# Patient Record
Sex: Female | Born: 1990 | Hispanic: Yes | Smoking: Former smoker
Health system: Southern US, Community
[De-identification: ages and names within clinical notes are randomized; demographics above are authoritative.]

## PROBLEM LIST (undated history)

## (undated) ENCOUNTER — Emergency Department (HOSPITAL_COMMUNITY): Payer: Medicaid Other

## (undated) DIAGNOSIS — H6690 Otitis media, unspecified, unspecified ear: Secondary | ICD-10-CM

## (undated) DIAGNOSIS — F329 Major depressive disorder, single episode, unspecified: Secondary | ICD-10-CM

## (undated) DIAGNOSIS — A749 Chlamydial infection, unspecified: Secondary | ICD-10-CM

## (undated) DIAGNOSIS — F32A Depression, unspecified: Secondary | ICD-10-CM

## (undated) DIAGNOSIS — D3500 Benign neoplasm of unspecified adrenal gland: Secondary | ICD-10-CM

## (undated) DIAGNOSIS — F419 Anxiety disorder, unspecified: Secondary | ICD-10-CM

## (undated) HISTORY — DX: Chlamydial infection, unspecified: A74.9

## (undated) HISTORY — PX: WISDOM TOOTH EXTRACTION: SHX21

---
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
## 2014-08-18 DIAGNOSIS — F32A Depression, unspecified: Secondary | ICD-10-CM | POA: Insufficient documentation

## 2014-08-18 DIAGNOSIS — Z8659 Personal history of other mental and behavioral disorders: Secondary | ICD-10-CM | POA: Insufficient documentation

## 2016-02-08 DIAGNOSIS — J029 Acute pharyngitis, unspecified: Secondary | ICD-10-CM | POA: Diagnosis present

## 2016-02-08 DIAGNOSIS — J039 Acute tonsillitis, unspecified: Secondary | ICD-10-CM | POA: Insufficient documentation

## 2016-02-09 ENCOUNTER — Emergency Department (HOSPITAL_BASED_OUTPATIENT_CLINIC_OR_DEPARTMENT_OTHER): Payer: Medicaid Other

## 2016-02-09 ENCOUNTER — Emergency Department (HOSPITAL_BASED_OUTPATIENT_CLINIC_OR_DEPARTMENT_OTHER)
Admission: EM | Admit: 2016-02-09 | Discharge: 2016-02-09 | Disposition: A | Discharge status: Home / Self Care | Payer: Medicaid Other | Attending: Emergency Medicine | Admitting: Emergency Medicine

## 2016-02-09 ENCOUNTER — Encounter (HOSPITAL_BASED_OUTPATIENT_CLINIC_OR_DEPARTMENT_OTHER): Payer: Self-pay | Admitting: Emergency Medicine

## 2016-02-09 DIAGNOSIS — J039 Acute tonsillitis, unspecified: Secondary | ICD-10-CM

## 2016-02-09 LAB — CBC WITH DIFFERENTIAL/PLATELET
BASOS ABS: 0 10*3/uL (ref 0.0–0.1)
BASOS PCT: 0 %
EOS ABS: 0.4 10*3/uL (ref 0.0–0.7)
Eosinophils Relative: 3 %
HCT: 36.5 % (ref 36.0–46.0)
HEMOGLOBIN: 11.8 g/dL — AB (ref 12.0–15.0)
LYMPHS ABS: 3.4 10*3/uL (ref 0.7–4.0)
Lymphocytes Relative: 30 %
MCH: 29.5 pg (ref 26.0–34.0)
MCHC: 32.3 g/dL (ref 30.0–36.0)
MCV: 91.3 fL (ref 78.0–100.0)
Monocytes Absolute: 0.8 10*3/uL (ref 0.1–1.0)
Monocytes Relative: 7 %
NEUTROS PCT: 60 %
Neutro Abs: 6.7 10*3/uL (ref 1.7–7.7)
Platelets: 365 10*3/uL (ref 150–400)
RBC: 4 MIL/uL (ref 3.87–5.11)
RDW: 12.6 % (ref 11.5–15.5)
WBC: 11.3 10*3/uL — AB (ref 4.0–10.5)

## 2016-02-09 LAB — BASIC METABOLIC PANEL
Anion gap: 7 (ref 5–15)
BUN: 15 mg/dL (ref 6–20)
CALCIUM: 8.7 mg/dL — AB (ref 8.9–10.3)
CHLORIDE: 107 mmol/L (ref 101–111)
CO2: 25 mmol/L (ref 22–32)
CREATININE: 0.81 mg/dL (ref 0.44–1.00)
Glucose, Bld: 99 mg/dL (ref 65–99)
Potassium: 3.7 mmol/L (ref 3.5–5.1)
SODIUM: 139 mmol/L (ref 135–145)

## 2016-02-09 LAB — PREGNANCY, URINE: PREG TEST UR: NEGATIVE

## 2016-02-09 LAB — RAPID STREP SCREEN (MED CTR MEBANE ONLY): Streptococcus, Group A Screen (Direct): NEGATIVE

## 2016-02-09 LAB — MONONUCLEOSIS SCREEN: MONO SCREEN: NEGATIVE

## 2016-02-09 MED ORDER — SODIUM CHLORIDE 0.9 % IV BOLUS (SEPSIS)
1000.0000 mL | Freq: Once | INTRAVENOUS | Status: AC
  Administered 2016-02-09: 1000 mL via INTRAVENOUS

## 2016-02-09 MED ORDER — IOPAMIDOL (ISOVUE-300) INJECTION 61%
75.0000 mL | Freq: Once | INTRAVENOUS | Status: AC | PRN
Start: 2016-02-09 — End: 2016-02-09
  Administered 2016-02-09: 75 mL via INTRAVENOUS

## 2016-02-09 MED ORDER — IOPAMIDOL (ISOVUE-300) INJECTION 61%: 80.0000 mL | Freq: Once | INTRAVENOUS | Status: DC | PRN

## 2016-02-09 MED ORDER — DEXAMETHASONE SODIUM PHOSPHATE 10 MG/ML IJ SOLN
10.0000 mg | Freq: Once | INTRAMUSCULAR | Status: AC
  Administered 2016-02-09: 10 mg via INTRAVENOUS
  Filled 2016-02-09: qty 1

## 2016-02-09 MED ORDER — PREDNISONE 10 MG PO TABS: 20.0000 mg | ORAL_TABLET | Freq: Two times a day (BID) | ORAL | Status: DC

## 2016-02-09 MED ORDER — CEPHALEXIN 500 MG PO CAPS: 500.0000 mg | ORAL_CAPSULE | Freq: Four times a day (QID) | ORAL | Status: DC

## 2016-02-09 NOTE — ED Notes (Signed)
Up to b/r, steady gait 

## 2016-02-09 NOTE — ED Notes (Signed)
Patient reports that she has a sore throat that she has had for 2 weeks. She reports that it has worsened to the point that now she can not talk or eat

## 2016-02-09 NOTE — ED Notes (Signed)
Pt transported to and from CT.

## 2016-02-09 NOTE — Discharge Instructions (Signed)
Keflex as prescribed.  Prednisone as prescribed.  Return to the emergency department if you develop difficulty breathing, an inability to swallow, or other new and concerning symptoms.   Tonsillitis Tonsillitis is an infection of the throat that causes the tonsils to become red, tender, and swollen. Tonsils are collections of lymphoid tissue at the back of the throat. Each tonsil has crevices (crypts). Tonsils help fight nose and throat infections and keep infection from spreading to other parts of the body for the first 18 months of life.  CAUSES Sudden (acute) tonsillitis is usually caused by infection with streptococcal bacteria. Long-lasting (chronic) tonsillitis occurs when the crypts of the tonsils become filled with pieces of food and bacteria, which makes it easy for the tonsils to become repeatedly infected. SYMPTOMS  Symptoms of tonsillitis include:  A sore throat, with possible difficulty swallowing.  White patches on the tonsils.  Fever.  Tiredness.  New episodes of snoring during sleep, when you did not snore before.  Small, foul-smelling, yellowish-white pieces of material (tonsilloliths) that you occasionally cough up or spit out. The tonsilloliths can also cause you to have bad breath. DIAGNOSIS Tonsillitis can be diagnosed through a physical exam. Diagnosis can be confirmed with the results of lab tests, including a throat culture. TREATMENT  The goals of tonsillitis treatment include the reduction of the severity and duration of symptoms and prevention of associated conditions. Symptoms of tonsillitis can be improved with the use of steroids to reduce the swelling. Tonsillitis caused by bacteria can be treated with antibiotic medicines. Usually, treatment with antibiotic medicines is started before the cause of the tonsillitis is known. However, if it is determined that the cause is not bacterial, antibiotic medicines will not treat the tonsillitis. If attacks of  tonsillitis are severe and frequent, your health care provider may recommend surgery to remove the tonsils (tonsillectomy). HOME CARE INSTRUCTIONS   Rest as much as possible and get plenty of sleep.  Drink plenty of fluids. While the throat is very sore, eat soft foods or liquids, such as sherbet, soups, or instant breakfast drinks.  Eat frozen ice pops.  Gargle with a warm or cold liquid to help soothe the throat. Mix 1/4 teaspoon of salt and 1/4 teaspoon of baking soda in 8 oz of water. SEEK MEDICAL CARE IF:   Large, tender lumps develop in your neck.  A rash develops.  A green, yellow-brown, or bloody substance is coughed up.  You are unable to swallow liquids or food for 24 hours.  You notice that only one of the tonsils is swollen. SEEK IMMEDIATE MEDICAL CARE IF:   You develop any new symptoms such as vomiting, severe headache, stiff neck, chest pain, or trouble breathing or swallowing.  You have severe throat pain along with drooling or voice changes.  You have severe pain, unrelieved with recommended medications.  You are unable to fully open the mouth.  You develop redness, swelling, or severe pain anywhere in the neck.  You have a fever. MAKE SURE YOU:   Understand these instructions.  Will watch your condition.  Will get help right away if you are not doing well or get worse.   This information is not intended to replace advice given to you by your health care provider. Make sure you discuss any questions you have with your health care provider.   Document Released: 06/10/2005 Document Revised: 09/21/2014 Document Reviewed: 02/17/2013 Elsevier Interactive Patient Education [employer].

## 2016-02-09 NOTE — ED Notes (Signed)
MD at bedside explaining results to patient and family.Marland Kitchen

## 2016-02-09 NOTE — ED Notes (Addendum)
"  feel better", updated on wait, alert, NAD, calm, interactive. IVF infusing.

## 2016-02-09 NOTE — ED Provider Notes (Signed)
CSN: [id number]     Arrival date & time 02/08/16  2357 History  By signing my name below, I, Hansel Feinstein and Georgette Shell, attest that this documentation has been prepared under the direction and in the presence of Veryl Speak, MD. Electronically Signed: Hansel Feinstein and Georgette Shell, ED Scribe. 02/09/2016. 12:50 AM.   Chief Complaint  Patient presents with  . Sore Throat    Patient is a 25 y.o. female presenting with pharyngitis. The history is provided by the patient. No language interpreter was used.  Sore Throat This is a new problem. The current episode started more than 1 week ago. The problem occurs constantly. The problem has been gradually worsening. Pertinent negatives include no chest pain, no abdominal pain, no headaches and no shortness of breath. The symptoms are aggravated by swallowing and eating. Nothing relieves the symptoms. She has tried nothing for the symptoms. The treatment provided no relief.   HPI Comments: Diana Fuentes is a 25 y.o. female who presents to the Emergency Department complaining of gradually worsening, constant, burning sore throat (R>L) onset a week and a half ago. Patient states her pain is worsened when talking, swallowing and eating. Pt denies taking OTC medications at home to improve symptoms. Patient reports recent sick contact with boyfriend who had URI symptoms. Patient denies fever, cough, difficulty tolerating secretions.  History reviewed. No pertinent past medical history. History reviewed. No pertinent past surgical history. History reviewed. No pertinent family history. Social History  Substance Use Topics  . Smoking status: Never Smoker   . Smokeless tobacco: None  . Alcohol Use: No   OB History    No data available     Review of Systems  Constitutional: Negative for fever.  HENT: Positive for sore throat.   Respiratory: Negative for shortness of breath.   Cardiovascular: Negative for chest pain.  Gastrointestinal: Negative for  abdominal pain.  Neurological: Negative for headaches.  All other systems reviewed and are negative.   Allergies  Review of patient's allergies indicates no known allergies.  Home Medications   Prior to Admission medications   Not on File   BP 132/94 mmHg  Pulse 81  Temp(Src) 99 F (37.2 C) (Oral)  Resp 18  Ht 5\' 5"  (1.651 m)  Wt 220 lb (99.791 kg)  BMI 36.61 kg/m2  SpO2 100%  LMP 01/28/2016 Physical Exam  Constitutional: She is oriented to person, place, and time. She appears well-developed and well-nourished.  HENT:  Head: Normocephalic.  Mouth/Throat: No oropharyngeal exudate.  Right tonsil is hypertrophied deviated across the midline. No exudates or significant swelling otherwise. TMs are clear bilaterally.  Eyes: Conjunctivae are normal.  Neck: Normal range of motion. Neck supple.  Cardiovascular: Normal rate, regular rhythm and normal heart sounds.   Pulmonary/Chest: Effort normal and breath sounds normal. No respiratory distress.  Abdominal: She exhibits no distension.  Musculoskeletal: Normal range of motion.  Lymphadenopathy:    She has cervical adenopathy.  Neurological: She is alert and oriented to person, place, and time.  Skin: Skin is warm and dry.  Psychiatric: She has a normal mood and affect. Her behavior is normal.  Nursing note and vitals reviewed.   ED Course  Procedures (including critical care time) DIAGNOSTIC STUDIES: Oxygen Saturation is 100% on RA, normal by my interpretation.    COORDINATION OF CARE: 12:50 AM Discussed treatment plan with pt at bedside which includes rapid strep, CT and pt agreed to plan.   Labs Review Labs Reviewed  RAPID STREP  SCREEN (NOT AT [facility])  CULTURE, GROUP A STREP [facility])    Imaging Review No results found. I have personally reviewed and evaluated these images and lab results as part of my medical decision-making.   EKG Interpretation None      MDM   Final diagnoses:  None    Patient is a  25 year old female who presents with complaints of sore throat. Her initial strep test was negative. On exam, her right tonsil was significantly swollen out of proportion with the left. This raised the concern of a possible abscess. For this reason a CT scan of the soft tissues was obtained which revealed tonsillitis, however no abscess. She will be discharged with steroids, Keflex, and when necessary return. Her mono test was negative as well.  I personally performed the services described in this documentation, which was scribed in my presence. The recorded information has been reviewed and is accurate.         Veryl Speak, MD 02/09/16 [phone]

## 2016-02-12 LAB — CULTURE, GROUP A STREP (THRC)

## 2016-04-17 ENCOUNTER — Emergency Department (HOSPITAL_BASED_OUTPATIENT_CLINIC_OR_DEPARTMENT_OTHER)
Admission: EM | Admit: 2016-04-17 | Discharge: 2016-04-17 | Disposition: A | Discharge status: Home / Self Care | Payer: Medicaid Other | Attending: Emergency Medicine | Admitting: Emergency Medicine

## 2016-04-17 ENCOUNTER — Encounter (HOSPITAL_BASED_OUTPATIENT_CLINIC_OR_DEPARTMENT_OTHER): Payer: Self-pay | Admitting: Emergency Medicine

## 2016-04-17 DIAGNOSIS — F17200 Nicotine dependence, unspecified, uncomplicated: Secondary | ICD-10-CM | POA: Insufficient documentation

## 2016-04-17 DIAGNOSIS — H6092 Unspecified otitis externa, left ear: Secondary | ICD-10-CM | POA: Insufficient documentation

## 2016-04-17 HISTORY — DX: Otitis media, unspecified, unspecified ear: H66.90

## 2016-04-17 MED ORDER — CIPROFLOXACIN-DEXAMETHASONE 0.3-0.1 % OT SUSP
4.0000 [drp] | Freq: Once | OTIC | Status: AC
  Administered 2016-04-17: 4 [drp] via OTIC
  Filled 2016-04-17: qty 7.5

## 2016-04-17 NOTE — ED Triage Notes (Signed)
Patient reports left ear pain which began yesterday.  Reports pain radiates to left side of forehead.  Denies fevers, nasal congestion.  Reports history of ear infections.

## 2016-04-17 NOTE — ED Provider Notes (Signed)
[facility] Provider Note   CSN: [id number] Arrival date & time: 04/17/16  [id number]  First Provider Contact:  First MD Initiated Contact with Patient 04/17/16 [phone]        History   Chief Complaint Chief Complaint  Patient presents with  . Otalgia    HPI Diana Fuentes is a 25 y.o. female.  The history is provided by the patient.  Otalgia  This is a new problem. The current episode started 2 days ago. There is pain in the left ear. The problem occurs constantly. The problem has been gradually worsening. There has been no fever. The pain is at a severity of 8/10. The pain is severe. Pertinent negatives include no ear discharge, no headaches, no rhinorrhea, no sore throat and no cough. Associated symptoms comments: Patient has been swimming a lot recently at the pool. Her past medical history does not include chronic ear infection or hearing loss.    Past Medical History:  Diagnosis Date  . Ear infection     There are no active problems to display for this patient.   Past Surgical History:  Procedure Laterality Date  . WISDOM TOOTH EXTRACTION      OB History    No data available       Home Medications    Prior to Admission medications   Medication Sig Start Date End Date Taking? Authorizing Provider  cephALEXin (KEFLEX) 500 MG capsule Take 1 capsule (500 mg total) by mouth 4 (four) times daily. 02/09/16   Veryl Speak, MD  predniSONE (DELTASONE) 10 MG tablet Take 2 tablets (20 mg total) by mouth 2 (two) times daily. 02/09/16   Veryl Speak, MD    Family History History reviewed. No pertinent family history.  Social History Social History  Substance Use Topics  . Smoking status: Never Smoker  . Smokeless tobacco: Current User  . Alcohol use No     Allergies   Review of patient's allergies indicates no known allergies.   Review of Systems Review of Systems  HENT: Positive for ear pain. Negative for ear discharge, rhinorrhea and sore throat.     Respiratory: Negative for cough.   Neurological: Negative for headaches.  All other systems reviewed and are negative.    Physical Exam Updated Vital Signs BP 123/94 (BP Location: Right Arm)   Pulse 70   Temp 98.7 F (37.1 C) (Oral)   Resp 18   Ht 5\' 5"  (1.651 m)   Wt 215 lb (97.5 kg)   LMP 03/05/2016 (Approximate)   SpO2 100%   BMI 35.78 kg/m   Physical Exam  Constitutional: She is oriented to person, place, and time. She appears well-developed and well-nourished. No distress.  HENT:  Head: Normocephalic and atraumatic.  Right Ear: Tympanic membrane normal.  Left Ear: There is drainage and swelling.  Ears:  Mouth/Throat: Oropharynx is clear and moist.  Eyes: Conjunctivae and EOM are normal. Pupils are equal, round, and reactive to light.  Neck: Normal range of motion. Neck supple.  Cardiovascular: Normal rate, regular rhythm and intact distal pulses.   No murmur heard. Pulmonary/Chest: Effort normal and breath sounds normal. No respiratory distress. She has no wheezes. She has no rales.  Musculoskeletal: Normal range of motion. She exhibits no edema or tenderness.  Neurological: She is alert and oriented to person, place, and time.  Skin: Skin is warm and dry. No rash noted. No erythema.  Psychiatric: She has a normal mood and affect. Her behavior is normal.  Nursing  note and vitals reviewed.    ED Treatments / Results  Labs (all labs ordered are listed, but only abnormal results are displayed) Labs Reviewed - No data to display  EKG  EKG Interpretation None       Radiology No results found.  Procedures Procedures (including critical care time)  Medications Ordered in ED Medications  ciprofloxacin-dexamethasone (CIPRODEX) 0.3-0.1 % otic suspension 4 drop (not administered)     Initial Impression / Assessment and Plan / ED Course  I have reviewed the triage vital signs and the nursing notes.  Pertinent labs & imaging results that were available  during my care of the patient were reviewed by me and considered in my medical decision making (see chart for details).  Clinical Course    Patient presents with symptoms consistent with left otitis media. There are no complicating features. She was started on Ciprodex drops.  Final Clinical Impressions(s) / ED Diagnoses   Final diagnoses:  Otitis externa, left    New Prescriptions New Prescriptions   No medications on file     Blanchie Dessert, MD 04/17/16 [phone]

## 2016-04-17 NOTE — ED Notes (Signed)
MD at bedside. 

## 2016-04-19 ENCOUNTER — Encounter (HOSPITAL_BASED_OUTPATIENT_CLINIC_OR_DEPARTMENT_OTHER): Payer: Self-pay | Admitting: Emergency Medicine

## 2016-04-19 ENCOUNTER — Emergency Department (HOSPITAL_BASED_OUTPATIENT_CLINIC_OR_DEPARTMENT_OTHER)
Admission: EM | Admit: 2016-04-19 | Discharge: 2016-04-19 | Disposition: A | Discharge status: Home / Self Care | Payer: Medicaid Other | Attending: Emergency Medicine | Admitting: Emergency Medicine

## 2016-04-19 DIAGNOSIS — H608X2 Other otitis externa, left ear: Secondary | ICD-10-CM | POA: Insufficient documentation

## 2016-04-19 DIAGNOSIS — H61002 Unspecified perichondritis of left external ear: Secondary | ICD-10-CM

## 2016-04-19 DIAGNOSIS — H61012 Acute perichondritis of left external ear: Secondary | ICD-10-CM | POA: Insufficient documentation

## 2016-04-19 DIAGNOSIS — H6092 Unspecified otitis externa, left ear: Secondary | ICD-10-CM

## 2016-04-19 LAB — BASIC METABOLIC PANEL
Anion gap: 6 (ref 5–15)
BUN: 12 mg/dL (ref 6–20)
CHLORIDE: 107 mmol/L (ref 101–111)
CO2: 26 mmol/L (ref 22–32)
Calcium: 8.7 mg/dL — ABNORMAL LOW (ref 8.9–10.3)
Creatinine, Ser: 0.78 mg/dL (ref 0.44–1.00)
GFR calc non Af Amer: >60 mL/min (ref >60)
Glucose, Bld: 103 mg/dL — ABNORMAL HIGH (ref 65–99)
POTASSIUM: 3.8 mmol/L (ref 3.5–5.1)
SODIUM: 139 mmol/L (ref 135–145)

## 2016-04-19 MED ORDER — CIPROFLOXACIN HCL 500 MG PO TABS: 500.0000 mg | ORAL_TABLET | Freq: Two times a day (BID) | ORAL | 0 refills | Status: DC

## 2016-04-19 MED ORDER — DOXYCYCLINE HYCLATE 100 MG PO CAPS
100.0000 mg | ORAL_CAPSULE | Freq: Two times a day (BID) | ORAL | 0 refills | Status: DC
Start: 2016-04-19 — End: 2018-02-06

## 2016-04-19 MED ORDER — IBUPROFEN 800 MG PO TABS: 800.0000 mg | ORAL_TABLET | Freq: Three times a day (TID) | ORAL | 0 refills | Status: DC

## 2016-04-19 MED ORDER — CIPROFLOXACIN HCL 500 MG PO TABS
500.0000 mg | ORAL_TABLET | Freq: Once | ORAL | Status: AC
  Administered 2016-04-19: 500 mg via ORAL
  Filled 2016-04-19: qty 1

## 2016-04-19 NOTE — ED Notes (Signed)
MD with pt  

## 2016-04-19 NOTE — ED Provider Notes (Signed)
[facility] Provider Note   CSN: [id number] Arrival date & time: 04/19/16  [id number]  First Provider Contact:  First MD Initiated Contact with Patient 04/19/16 [phone]        History   Chief Complaint No chief complaint on file.   HPI Diana Fuentes is a 25 y.o. female.  The history is provided by the patient.  Otalgia  This is a new problem. The current episode started more than 2 days ago. The problem occurs constantly. The problem has not changed since onset.There has been no fever. The pain is moderate. Associated symptoms include ear discharge. Pertinent negatives include no sore throat. Her past medical history does not include chronic ear infection.  Started on ciprodex in the ED on 04/17/16.  No f/c/r. Is instilling ear drops 3 times daily now with redness and warmth of the pinna.    Past Medical History:  Diagnosis Date  . Ear infection     There are no active problems to display for this patient.   Past Surgical History:  Procedure Laterality Date  . WISDOM TOOTH EXTRACTION      OB History    No data available       Home Medications    Prior to Admission medications   Medication Sig Start Date End Date Taking? Authorizing Provider  cephALEXin (KEFLEX) 500 MG capsule Take 1 capsule (500 mg total) by mouth 4 (four) times daily. 02/09/16   Veryl Speak, MD  predniSONE (DELTASONE) 10 MG tablet Take 2 tablets (20 mg total) by mouth 2 (two) times daily. 02/09/16   Veryl Speak, MD    Family History No family history on file.  Social History Social History  Substance Use Topics  . Smoking status: Never Smoker  . Smokeless tobacco: Current User  . Alcohol use No     Allergies   Review of patient's allergies indicates no known allergies.   Review of Systems Review of Systems  Constitutional: Negative for fever.  HENT: Positive for ear discharge and ear pain. Negative for facial swelling and sore throat.   All other systems reviewed and are  negative.    Physical Exam Updated Vital Signs BP 121/81 (BP Location: Left Arm)   Pulse 104   Temp 98.7 F (37.1 C) (Oral)   Resp 20   Ht 5\' 5"  (1.651 m)   Wt 210 lb (95.3 kg)   LMP 03/05/2016 (Approximate)   SpO2 100%   BMI 34.95 kg/m   Physical Exam  Constitutional: She is oriented to person, place, and time. She appears well-developed and well-nourished. No distress.  HENT:  Head: Normocephalic and atraumatic.  Right Ear: External ear normal. No mastoid tenderness. Tympanic membrane is not injected.  Left Ear: No mastoid tenderness.  Ears:  Nose: Nose normal.  Mouth/Throat: Oropharynx is clear and moist.  Eyes: EOM are normal. Pupils are equal, round, and reactive to light.  Neck: Normal range of motion. Neck supple. No tracheal deviation present.  Cardiovascular: Normal rate, regular rhythm and intact distal pulses.   Pulmonary/Chest: Effort normal and breath sounds normal.  Abdominal: Soft. Bowel sounds are normal.  Musculoskeletal: Normal range of motion.  Lymphadenopathy:    She has no cervical adenopathy.  Neurological: She is alert and oriented to person, place, and time. She has normal reflexes.  Skin: Skin is warm and dry. Capillary refill takes less than 2 seconds.  Psychiatric: She has a normal mood and affect.     ED Treatments / Results  Labs (all labs ordered are listed, but only abnormal results are displayed) Labs Reviewed - No data to display  EKG  EKG Interpretation None       Radiology No results found.  Procedures Procedures (including critical care time)  Medications Ordered in ED Medications - No data to display   Initial Impression / Assessment and Plan / ED Course  I have reviewed the triage vital signs and the nursing notes.  Pertinent labs & imaging results that were available during my care of the patient were reviewed by me and considered in my medical decision making (see chart for details).  Clinical Course    Vitals:   04/19/16 0520  BP: 121/81  Pulse: 104  Resp: 20  Temp: 98.7 F (37.1 C)   Results for orders placed or performed during the hospital encounter of [id number]  Basic metabolic panel  Result Value Ref Range   Sodium 139 135 - 145 mmol/L   Potassium 3.8 3.5 - 5.1 mmol/L   Chloride 107 101 - 111 mmol/L   CO2 26 22 - 32 mmol/L   Glucose, Bld 103 (H) 65 - 99 mg/dL   BUN 12 6 - 20 mg/dL   Creatinine, Ser 0.78 0.44 - 1.00 mg/dL   Calcium 8.7 (L) 8.9 - 10.3 mg/dL   GFR calc non Af Amer >60 >60 mL/min   GFR calc Af Amer >60 >60 mL/min   Anion gap 6 5 - 15   No results found.  Medications  ciprofloxacin (CIPRO) tablet 500 mg (500 mg Oral Given 04/19/16 0604)    Given swelling of the pinna, will start oral cipro and have patient follow up with ENT.  No diabetes.  If any problems go to [facility] as can be seen by ENT there.  Told patient it is imperative that she take all antibiotics and call Monday am to be seen immediately by ENT and that she must use drops four times daily as previously directed  Final Clinical Impressions(s) / ED Diagnoses   Final diagnoses:  None    New Prescriptions New Prescriptions   No medications on file  All questions answered to patient's satisfaction. Based on history and exam patient has been appropriately medically screened and emergency conditions excluded. Patient is stable for discharge at this time. Follow up with your PMDfor recheck in 2 daysand strict return precautions given.   Veatrice Kells, MD 04/19/16 [phone]

## 2016-04-19 NOTE — ED Triage Notes (Addendum)
C/o left ear pain for past couple days. Was seen and treated for same and states not getting better. Fever yesterday. States she had n/v yesterday, but states that has improved. No other complaints. Swelling noted to left external ear area on exam. States she can hear "a little" out of her left ear and feels like fluid is in her ear.

## 2016-04-20 ENCOUNTER — Emergency Department (HOSPITAL_COMMUNITY)
Admission: EM | Admit: 2016-04-20 | Discharge: 2016-04-20 | Disposition: A | Discharge status: Home / Self Care | Payer: Self-pay | Attending: Emergency Medicine | Admitting: Emergency Medicine

## 2016-04-20 ENCOUNTER — Emergency Department (HOSPITAL_COMMUNITY): Payer: Self-pay

## 2016-04-20 DIAGNOSIS — H6093 Unspecified otitis externa, bilateral: Secondary | ICD-10-CM | POA: Insufficient documentation

## 2016-04-20 LAB — CBC WITH DIFFERENTIAL/PLATELET
Basophils Absolute: 0 10*3/uL (ref 0.0–0.1)
Basophils Relative: 0 %
EOS ABS: 0.1 10*3/uL (ref 0.0–0.7)
EOS PCT: 1 %
HCT: 36.4 % (ref 36.0–46.0)
HEMOGLOBIN: 11.7 g/dL — AB (ref 12.0–15.0)
LYMPHS ABS: 1.6 10*3/uL (ref 0.7–4.0)
Lymphocytes Relative: 14 %
MCH: 29.3 pg (ref 26.0–34.0)
MCHC: 32.1 g/dL (ref 30.0–36.0)
MCV: 91 fL (ref 78.0–100.0)
MONOS PCT: 5 %
Monocytes Absolute: 0.6 10*3/uL (ref 0.1–1.0)
NEUTROS PCT: 80 %
Neutro Abs: 9.5 10*3/uL — ABNORMAL HIGH (ref 1.7–7.7)
Platelets: 350 10*3/uL (ref 150–400)
RBC: 4 MIL/uL (ref 3.87–5.11)
RDW: 12.6 % (ref 11.5–15.5)
WBC: 11.8 10*3/uL — ABNORMAL HIGH (ref 4.0–10.5)

## 2016-04-20 LAB — BASIC METABOLIC PANEL
Anion gap: 8 (ref 5–15)
BUN: 8 mg/dL (ref 6–20)
CHLORIDE: 107 mmol/L (ref 101–111)
CO2: 24 mmol/L (ref 22–32)
CREATININE: 0.58 mg/dL (ref 0.44–1.00)
Calcium: 8.8 mg/dL — ABNORMAL LOW (ref 8.9–10.3)
GFR calc Af Amer: >60 mL/min (ref >60)
GFR calc non Af Amer: >60 mL/min (ref >60)
GLUCOSE: 104 mg/dL — AB (ref 65–99)
Potassium: 3.9 mmol/L (ref 3.5–5.1)
SODIUM: 139 mmol/L (ref 135–145)

## 2016-04-20 LAB — I-STAT BETA HCG BLOOD, ED (MC, WL, AP ONLY)

## 2016-04-20 MED ORDER — DEXTROSE 5 % IV SOLN
2.0000 g | Freq: Three times a day (TID) | INTRAVENOUS | Status: DC
  Administered 2016-04-20: 2 g via INTRAVENOUS
  Filled 2016-04-20: qty 2

## 2016-04-20 MED ORDER — HYDROCODONE-ACETAMINOPHEN 5-325 MG PO TABS
1.0000 | ORAL_TABLET | Freq: Once | ORAL | Status: AC
  Administered 2016-04-20: 1 via ORAL
  Filled 2016-04-20: qty 1

## 2016-04-20 NOTE — ED Provider Notes (Signed)
[facility] DEPT Provider Note   CSN: [id number] Arrival date & time: 04/20/16  [id number]  First Provider Contact:  First MD Initiated Contact with Patient 04/20/16 [phone]        History   Chief Complaint Chief Complaint  Patient presents with  . Otalgia    HPI  Blood pressure 121/80, pulse 85, last menstrual period 04/03/2016, SpO2 99 %.  Diana Fuentes is a 25 y.o. female complaining of worsening bilateral otalgia with decreased hearing and purulent discharge. Patient has been seen 2 times for similar symptoms, initially she was started on Ciprodex, she was seen again after worsening symptoms and then started on Cipro and doxycycline. She states that she doesn't feel the Ciprodex is going into the ear because of the amount of drainage coming out of the ear.  HPI  Past Medical History:  Diagnosis Date  . Ear infection     There are no active problems to display for this patient.   Past Surgical History:  Procedure Laterality Date  . WISDOM TOOTH EXTRACTION      OB History    No data available       Home Medications    Prior to Admission medications   Medication Sig Start Date End Date Taking? Authorizing Provider  ciprofloxacin (CIPRO) 500 MG tablet Take 1 tablet (500 mg total) by mouth 2 (two) times daily. One po bid x 7 days 04/19/16  Yes April Palumbo, MD  Ciprofloxacin-Dexamethasone (CIPRODEX OT) Place 4 drops into both ears 4 (four) times daily.    Yes Historical Provider, MD  doxycycline (VIBRAMYCIN) 100 MG capsule Take 1 capsule (100 mg total) by mouth 2 (two) times daily. One po bid x 7 days 04/19/16  Yes April Palumbo, MD  ibuprofen (ADVIL,MOTRIN) 800 MG tablet Take 1 tablet (800 mg total) by mouth 3 (three) times daily. 04/19/16  Yes April Palumbo, MD    Family History No family history on file.  Social History Social History  Substance Use Topics  . Smoking status: Never Smoker  . Smokeless tobacco: Current User  . Alcohol use No     Allergies     Review of patient's allergies indicates no known allergies.   Review of Systems Review of Systems  10 systems reviewed and found to be negative, except as noted in the HPI.   Physical Exam Updated Vital Signs BP 113/62 (BP Location: Right Arm)   Pulse 80   Temp 98 F (36.7 C)   Resp 18   Ht 5\' 5"  (1.651 m)   Wt 95.3 kg   LMP 04/03/2016   SpO2 97%   BMI 34.95 kg/m   Physical Exam  Constitutional: She is oriented to person, place, and time. She appears well-developed and well-nourished. No distress.  HENT:  Head: Normocephalic and atraumatic.  Mouth/Throat: Oropharynx is clear and moist.  Bilateral ear canals are edematous with a significant amount of purulent drainage, bilateral ears also edematous and swollen, no focal bony tenderness over the mastoid bilaterally. Patient is exquisitely tender to light touch anywhere on the ear.  Eyes: Conjunctivae and EOM are normal. Pupils are equal, round, and reactive to light.  Neck: Normal range of motion.  Cardiovascular: Normal rate, regular rhythm and intact distal pulses.   Pulmonary/Chest: Effort normal and breath sounds normal.  Abdominal: Soft. There is no tenderness.  Musculoskeletal: Normal range of motion.  Neurological: She is alert and oriented to person, place, and time.  Skin: She is not diaphoretic.  Psychiatric: She has  a normal mood and affect.  Nursing note and vitals reviewed.     ED Treatments / Results  Labs (all labs ordered are listed, but only abnormal results are displayed) Labs Reviewed  CBC WITH DIFFERENTIAL/PLATELET - Abnormal; Notable for the following:       Result Value   WBC 11.8 (*)    Hemoglobin 11.7 (*)    Neutro Abs 9.5 (*)    All other components within normal limits  BASIC METABOLIC PANEL - Abnormal; Notable for the following:    Glucose, Bld 104 (*)    Calcium 8.8 (*)    All other components within normal limits  CULTURE, BLOOD (ROUTINE X 2)  CULTURE, BLOOD (ROUTINE X 2)  BODY  FLUID CULTURE  I-STAT BETA HCG BLOOD, ED ([facility], [facility], [facility] ONLY)    EKG  EKG Interpretation None       Radiology Ct Maxillofacial Wo Contrast  Addendum Date: 04/20/2016   ADDENDUM REPORT: 04/20/2016 10:57 ADDENDUM: Study discussed by telephone with Shakil Dirk PA-C on 04/20/2016 at 1040 hours. Electronically Signed   By: Genevie Ann M.D.   On: 04/20/2016 10:57   Result Date: 04/20/2016 CLINICAL DATA:  25 year old female with left ear ache for 1 week, decreased hearing. Not improving despite ear drops and antibiotics. EXAM: CT MAXILLOFACIAL WITHOUT CONTRAST TECHNIQUE: Multidetector CT imaging of the maxillofacial structures was performed. Multiplanar CT image reconstructions were also generated. A small metallic BB was placed on the right temple in order to reliably differentiate right from left. COMPARISON:  Neck CT 02/09/2016 FINDINGS: Negative visualized larynx. Stable to mildly decreased symmetric appearing enlargement of the palatine tonsils and adenoids. Negative parapharyngeal and retropharyngeal spaces. Negative sublingual space, and submandibular glands. Negative visualized noncontrast brain parenchyma. Visualized orbit soft tissues are within normal limits. Both noncontrast parotid glands appear normal, although bilateral parotid space lymph nodes have increased compared to May. These measure up to 8 mm short axis individually and appear reactive. There is associated bilateral external auditory canal soft tissue swelling, up to severe and worse on the left. There is no definite pannus soft tissue swelling. There is widespread subcutaneous fat stranding on the left side in both the pre-auricular and retroauricular soft tissues. There is associated mild thickening of the superficial capsule the left parotid gland. The inflammatory stranding tracks inferiorly on the left superficial to the platysma. The superficial aspect of the left sternocleidomastoid muscle is affected. There is additional left level  5 reactive lymphadenopathy. The left EAC is largely effaced. There is subtotal opacification of the left tympanic cavity. The ossicles appear intact. There is a fluid level in the left mastoid antrum. There is peripheral left mastoid opacification. No associated osseous erosion identified. In May the left tympanic cavity and mastoid were clear. The contralateral right tympanic cavity and mastoids remain clear. No acute osseous abnormality identified. Bilateral paranasal sinuses are clear. IMPRESSION: 1. Up to severe left greater than right external auditory canal soft tissue swelling compatible with external otitis. However, on the left there is subtotal opacification of the left tympanic cavity with scattered fluid within the left mastoid air cells, and an overlying left periauricular cellulitis. No associated osseous erosion identified. Reactive left greater than right peri-auricular lymphadenopathy. 2. The constellation favors infectious external otitis, progressing on the left to internal otitis and a facial cellulitis. The left mastoid changes appear to be reactive (mastoid effusion) and no other complicating features identified. However, if there is involvement of the left pinna clinically or if this patient  is diabetic or immunocompromised then consider a Pseudomonas infection. Electronically Signed: By: Genevie Ann M.D. On: 04/20/2016 10:38    Procedures Procedures (including critical care time)  Medications Ordered in ED Medications  ceFEPIme (MAXIPIME) 2 g in dextrose 5 % 50 mL IVPB (0 g Intravenous Stopped 04/20/16 1155)  HYDROcodone-acetaminophen (NORCO/VICODIN) 5-325 MG per tablet 1 tablet (1 tablet Oral Given 04/20/16 0732)     Initial Impression / Assessment and Plan / ED Course  I have reviewed the triage vital signs and the nursing notes.  Pertinent labs & imaging results that were available during my care of the patient were reviewed by me and considered in my medical decision making (see  chart for details).  Clinical Course     Vitals:   04/20/16 0930 04/20/16 0945 04/20/16 0950 04/20/16 1050  BP: 128/77 128/87  113/62  Pulse: 83 87  80  Resp:  16  18  Temp:    98 F (36.7 C)  SpO2: 97% 96%  97%  Weight:   95.3 kg   Height:   5\' 5"  (1.651 m)     Medications  ceFEPIme (MAXIPIME) 2 g in dextrose 5 % 50 mL IVPB (0 g Intravenous Stopped 04/20/16 1155)  HYDROcodone-acetaminophen (NORCO/VICODIN) 5-325 MG per tablet 1 tablet (1 tablet Oral Given 04/20/16 0732)    Josclyn Waag is 25 y.o. female presenting with Worsening otitis externa. She has been started on Ciprodex and then on doxycycline and Cipro orally yesterday and symptoms have significantly worsened, bilateral ears are edematous, she has a significant amount of purulent drainage from the outer ear canals. It doesn't appear that that was the case several days ago. Given the aggressive outpatient treatment and worsening symptoms will consult ENT.  Radiologist Dr. Nevada Crane Left canal swollen shut, middle ear opaciopfied, reactive mastoid fluid (not true mastoiditis)  Concern for possible malignant otitis externa.  Ear nose and throat consult from Dr. Erik Obey appreciated: He would like to see the patient in the office, will obtain basic bacterial culture of fluid from left ear. Patient states that she has a ride to the office and understands to go directly there.  Evaluation does not show pathology that would require ongoing emergent intervention or inpatient treatment. Pt is hemodynamically stable and mentating appropriately. Discussed findings and plan with patient/guardian, who agrees with care plan. All questions answered. Return precautions discussed and outpatient follow up given.      Final Clinical Impressions(s) / ED Diagnoses   Final diagnoses:  Otitis externa, bilateral    New Prescriptions New Prescriptions   No medications on file     Monico Blitz, PA-C 04/20/16 [place],  MD 04/20/16 1553

## 2016-04-20 NOTE — Discharge Instructions (Signed)
Go directly to Dr. Noreene Filbert office, he will meet you there at 1 PM.

## 2016-04-20 NOTE — ED Notes (Signed)
Placed patient back on continuous pulse oximetry and blood pressure cuff; patient sitting on stretcher eating a bag of potato chips and visitor at bedside laying across 2 chairs with a blanket over his head

## 2016-04-20 NOTE — ED Notes (Signed)
Diana Fuentes; Transporter, transporting pt to CT.

## 2016-04-20 NOTE — ED Triage Notes (Signed)
Pt arrives by POV with c/o earache. Has been to urgent care in [place] twice, given ear drops the first time and antibiotics the second time. States they told her to come here if swelling got worse. Pt states she woke up this morning and swelling had increased. Pt states barely able to hear.

## 2016-04-20 NOTE — Progress Notes (Signed)
Pharmacy Antibiotic Note Diana Fuentes is a 25 y.o. female admitted on 04/20/2016 with worsening bilateral otitis externa that failed to respond to outpatient abx course consisting of ciprofloxacin and doxycycline.  Pharmacy has been consulted for Cefepime  dosing.  Plan: 1. Cefepime 2 grams IV every 8 hours  Height: 5\' 5"  (165.1 cm) Weight: 210 lb (95.3 kg) IBW/kg (Calculated) : 57  Temp (24hrs), Avg:98 F (36.7 C), Min:98 F (36.7 C), Max:98 F (36.7 C)   Recent Labs Lab 04/19/16 0615 04/20/16 0937  WBC  --  11.8*  CREATININE 0.78 0.58    Estimated Creatinine Clearance: 125.9 mL/min (by C-G formula based on SCr of 0.8 mg/dL).    No Known Allergies  Antimicrobials this admission: 8/7 Cefepime >>   Dose adjustments this admission: n/a  Microbiology results: None to date  Thank you for allowing pharmacy to be a part of this patient's care.  Vincenza Hews, PharmD, BCPS 04/20/2016, 11:07 AM Pager: [phone]

## 2016-04-23 LAB — EAR CULTURE

## 2016-04-24 ENCOUNTER — Telehealth (HOSPITAL_BASED_OUTPATIENT_CLINIC_OR_DEPARTMENT_OTHER): Payer: Self-pay | Admitting: Emergency Medicine

## 2016-04-24 NOTE — Telephone Encounter (Signed)
Post ED Visit - Positive Culture Follow-up  Culture report reviewed by antimicrobial stewardship pharmacist:  []  Elenor Quinones, Pharm.D. []  Heide Guile, Pharm.D., BCPS []  Parks Neptune, Pharm.D. []  Alycia Rossetti, Pharm.D., BCPS []  [place], [place].D., BCPS, AAHIVP []  Legrand Como, Pharm.D., BCPS, AAHIVP []  Milus Glazier, Pharm.D. []  Stephens November, Pharm.D.  Positive ear culture Treated with cipro, organism sensitive to the same and no further patient follow-up is required at this time.  Patient confirms that she saw ENT Dr. Erik Obey as instructed to do  Hazle Nordmann 04/24/2016, 3:29 PM

## 2016-04-25 LAB — CULTURE, BLOOD (ROUTINE X 2)
Culture: NO GROWTH
Culture: NO GROWTH

## 2016-07-01 ENCOUNTER — Telehealth (HOSPITAL_BASED_OUTPATIENT_CLINIC_OR_DEPARTMENT_OTHER): Payer: Self-pay | Admitting: Emergency Medicine

## 2016-07-01 NOTE — Telephone Encounter (Signed)
Lost to followup 

## 2017-08-13 ENCOUNTER — Other Ambulatory Visit: Payer: Self-pay

## 2017-08-13 ENCOUNTER — Encounter (HOSPITAL_BASED_OUTPATIENT_CLINIC_OR_DEPARTMENT_OTHER): Payer: Self-pay | Admitting: *Deleted

## 2017-08-13 ENCOUNTER — Emergency Department (HOSPITAL_BASED_OUTPATIENT_CLINIC_OR_DEPARTMENT_OTHER)
Admission: EM | Admit: 2017-08-13 | Discharge: 2017-08-14 | Disposition: A | Discharge status: Home / Self Care | Payer: Medicaid Other | Attending: Emergency Medicine | Admitting: Emergency Medicine

## 2017-08-13 ENCOUNTER — Emergency Department (HOSPITAL_BASED_OUTPATIENT_CLINIC_OR_DEPARTMENT_OTHER): Payer: Medicaid Other

## 2017-08-13 DIAGNOSIS — Z791 Long term (current) use of non-steroidal anti-inflammatories (NSAID): Secondary | ICD-10-CM | POA: Insufficient documentation

## 2017-08-13 DIAGNOSIS — S3992XA Unspecified injury of lower back, initial encounter: Secondary | ICD-10-CM | POA: Diagnosis present

## 2017-08-13 DIAGNOSIS — M79602 Pain in left arm: Secondary | ICD-10-CM | POA: Insufficient documentation

## 2017-08-13 DIAGNOSIS — S39012A Strain of muscle, fascia and tendon of lower back, initial encounter: Secondary | ICD-10-CM | POA: Diagnosis not present

## 2017-08-13 DIAGNOSIS — Y929 Unspecified place or not applicable: Secondary | ICD-10-CM | POA: Diagnosis not present

## 2017-08-13 DIAGNOSIS — Y9389 Activity, other specified: Secondary | ICD-10-CM | POA: Diagnosis not present

## 2017-08-13 DIAGNOSIS — Y999 Unspecified external cause status: Secondary | ICD-10-CM | POA: Insufficient documentation

## 2017-08-13 DIAGNOSIS — Z79899 Other long term (current) drug therapy: Secondary | ICD-10-CM | POA: Insufficient documentation

## 2017-08-13 MED ORDER — CYCLOBENZAPRINE HCL 5 MG PO TABS
5.0000 mg | ORAL_TABLET | Freq: Once | ORAL | Status: AC
  Administered 2017-08-13: 5 mg via ORAL
  Filled 2017-08-13: qty 1

## 2017-08-13 MED ORDER — NAPROXEN 250 MG PO TABS
250.0000 mg | ORAL_TABLET | Freq: Once | ORAL | Status: AC
  Administered 2017-08-13: 250 mg via ORAL
  Filled 2017-08-13: qty 1

## 2017-08-13 NOTE — ED Notes (Signed)
Patient transported to X-ray 
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
## 2017-08-14 MED ORDER — NAPROXEN 375 MG PO TABS: 375.0000 mg | ORAL_TABLET | Freq: Two times a day (BID) | ORAL | 0 refills | Status: DC

## 2017-08-14 MED ORDER — CYCLOBENZAPRINE HCL 10 MG PO TABS: 10.0000 mg | ORAL_TABLET | Freq: Two times a day (BID) | ORAL | 0 refills | Status: DC | PRN

## 2017-08-14 NOTE — Discharge Instructions (Signed)
Workup has been very reassuring.  X-ray showed no signs of broken bones.  This is likely musculoskeletal pain. Please take the Naproxen as prescribed for pain. Do not take any additional NSAIDs including Motrin, Aleve, Ibuprofen, Advil. Please the the Flexeril for muscle relaxation. This medication will make you drowsy so avoid situation that could place you in danger.  Warm soaks and Epsom salt.  Heating pad to the affected area.  Follow-up with primary care doctor if symptoms not improving.  Return to the ED with any worsening symptoms.
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
## 2018-01-10 ENCOUNTER — Other Ambulatory Visit: Payer: Self-pay

## 2018-01-10 ENCOUNTER — Emergency Department (HOSPITAL_BASED_OUTPATIENT_CLINIC_OR_DEPARTMENT_OTHER)
Admission: EM | Admit: 2018-01-10 | Discharge: 2018-01-11 | Disposition: A | Discharge status: Home / Self Care | Payer: Medicaid Other | Attending: Emergency Medicine | Admitting: Emergency Medicine

## 2018-01-10 ENCOUNTER — Encounter (HOSPITAL_BASED_OUTPATIENT_CLINIC_OR_DEPARTMENT_OTHER): Payer: Self-pay

## 2018-01-10 ENCOUNTER — Emergency Department (HOSPITAL_BASED_OUTPATIENT_CLINIC_OR_DEPARTMENT_OTHER): Payer: Medicaid Other

## 2018-01-10 DIAGNOSIS — D3500 Benign neoplasm of unspecified adrenal gland: Secondary | ICD-10-CM

## 2018-01-10 DIAGNOSIS — F17228 Nicotine dependence, chewing tobacco, with other nicotine-induced disorders: Secondary | ICD-10-CM | POA: Diagnosis not present

## 2018-01-10 DIAGNOSIS — K5900 Constipation, unspecified: Secondary | ICD-10-CM | POA: Diagnosis not present

## 2018-01-10 DIAGNOSIS — R109 Unspecified abdominal pain: Secondary | ICD-10-CM | POA: Diagnosis present

## 2018-01-10 LAB — URINALYSIS, MICROSCOPIC (REFLEX): WBC UA: NONE SEEN WBC/hpf (ref 0–5)

## 2018-01-10 LAB — URINALYSIS, ROUTINE W REFLEX MICROSCOPIC
Bilirubin Urine: NEGATIVE
GLUCOSE, UA: NEGATIVE mg/dL
Ketones, ur: NEGATIVE mg/dL
LEUKOCYTES UA: NEGATIVE
Nitrite: NEGATIVE
PH: 6.5 (ref 5.0–8.0)
Protein, ur: NEGATIVE mg/dL
SPECIFIC GRAVITY, URINE: 1.025 (ref 1.005–1.030)

## 2018-01-10 LAB — PREGNANCY, URINE: Preg Test, Ur: NEGATIVE

## 2018-01-10 NOTE — ED Triage Notes (Signed)
C/o abd pain x "couple months"-has not sought medical tx for c/o-NAD-steady gait

## 2018-01-11 ENCOUNTER — Emergency Department (HOSPITAL_BASED_OUTPATIENT_CLINIC_OR_DEPARTMENT_OTHER): Payer: Medicaid Other

## 2018-01-11 ENCOUNTER — Encounter (HOSPITAL_BASED_OUTPATIENT_CLINIC_OR_DEPARTMENT_OTHER): Payer: Self-pay | Admitting: Emergency Medicine

## 2018-01-11 DIAGNOSIS — D3502 Benign neoplasm of left adrenal gland: Secondary | ICD-10-CM | POA: Insufficient documentation

## 2018-01-11 MED ORDER — SIMETHICONE 40 MG/0.6ML PO SUSP: 40.0000 mg | Freq: Four times a day (QID) | ORAL | 0 refills | Status: DC | PRN

## 2018-01-11 MED ORDER — BENZONATATE 100 MG PO CAPS
200.0000 mg | ORAL_CAPSULE | Freq: Once | ORAL | Status: AC
  Administered 2018-01-11: 200 mg via ORAL
  Filled 2018-01-11: qty 2

## 2018-01-11 MED ORDER — POLYETHYLENE GLYCOL 3350 17 GM/SCOOP PO POWD: 17.0000 g | Freq: Every day | ORAL | 0 refills | Status: DC

## 2018-01-11 MED ORDER — ACETAMINOPHEN 500 MG PO TABS
1000.0000 mg | ORAL_TABLET | Freq: Once | ORAL | Status: AC
  Administered 2018-01-11: 1000 mg via ORAL
  Filled 2018-01-11: qty 2

## 2018-01-11 MED ORDER — GI COCKTAIL ~~LOC~~
30.0000 mL | Freq: Once | ORAL | Status: AC
  Administered 2018-01-11: 30 mL via ORAL
  Filled 2018-01-11: qty 30

## 2018-01-11 NOTE — ED Provider Notes (Signed)
[facility] Provider Note   CSN: [id number] Arrival date & time: 01/10/18  2205     History   Chief Complaint Chief Complaint  Patient presents with  . Abdominal Pain    HPI Diana Fuentes is a 26 y.o. female.  The history is provided by the patient.  Abdominal Pain   This is a chronic problem. The current episode started more than 1 week ago (at least 7 months ago). The problem occurs daily. The problem has not changed since onset.The pain is associated with an unknown factor. The quality of the pain is cramping. The pain is moderate. Pertinent negatives include anorexia, fever, belching, diarrhea, hematochezia, melena, nausea, vomiting, dysuria, frequency, hematuria, headaches, arthralgias and myalgias. Nothing aggravates the symptoms. Nothing relieves the symptoms. Past workup does not include CT scan. Her past medical history does not include PUD.  States she eats a lot of fast food.  No trauma. Unchanged.  Has not taken anything for same.    Past Medical History:  Diagnosis Date  . Ear infection     There are no active problems to display for this patient.   Past Surgical History:  Procedure Laterality Date  . WISDOM TOOTH EXTRACTION       OB History   None      Home Medications    Prior to Admission medications   Medication Sig Start Date End Date Taking? Authorizing Provider  ciprofloxacin (CIPRO) 500 MG tablet Take 1 tablet (500 mg total) by mouth 2 (two) times daily. One po bid x 7 days 04/19/16   Sim Choquette, MD  Ciprofloxacin-Dexamethasone (CIPRODEX OT) Place 4 drops into both ears 4 (four) times daily.     [provider]  cyclobenzaprine (FLEXERIL) 10 MG tablet Take 1 tablet (10 mg total) by mouth 2 (two) times daily as needed for muscle spasms. 08/14/17   Doristine Devoid, PA-C  doxycycline (VIBRAMYCIN) 100 MG capsule Take 1 capsule (100 mg total) by mouth 2 (two) times daily. One po bid x 7 days 04/19/16    Letha Mirabal, MD  ibuprofen (ADVIL,MOTRIN) 800 MG tablet Take 1 tablet (800 mg total) by mouth 3 (three) times daily. 04/19/16   Jleigh Striplin, MD  naproxen (NAPROSYN) 375 MG tablet Take 1 tablet (375 mg total) by mouth 2 (two) times daily. 08/14/17   Ocie Cornfield T, PA-C  polyethylene glycol powder (MIRALAX) powder Take 17 g by mouth daily. 01/11/18   Filip Luten, MD  simethicone (MYLICON) 40 ER/1.5QM drops Take 0.6 mLs (40 mg total) by mouth 4 (four) times daily as needed for flatulence. 01/11/18   Nyah Shepherd, MD    Family History No family history on file.  Social History Social History   Tobacco Use  . Smoking status: Never Smoker  . Smokeless tobacco: Current User  Substance Use Topics  . Alcohol use: No  . Drug use: No     Allergies   Patient has no known allergies.   Review of Systems Review of Systems  Constitutional: Negative for fever.  Respiratory: Negative for shortness of breath.   Cardiovascular: Negative for chest pain, palpitations and leg swelling.  Gastrointestinal: Positive for abdominal pain. Negative for anorexia, diarrhea, hematochezia, melena, nausea and vomiting.  Genitourinary: Negative for dysuria, flank pain, frequency, hematuria, pelvic pain, vaginal bleeding and vaginal discharge.  Musculoskeletal: Negative for arthralgias and myalgias.  Neurological: Negative for headaches.  All other systems reviewed and are negative.    Physical Exam Updated Vital  Signs BP 133/83 (BP Location: Left Arm)   Pulse 78   Temp 98.6 F (37 C) (Oral)   Resp 20   Ht 5\' 8"  (1.727 m)   Wt 90.7 kg (200 lb)   LMP 01/10/2018 Comment: (-) urine pregnancy//a.c.  SpO2 100%   BMI 30.41 kg/m   Physical Exam  Constitutional: She is oriented to person, place, and time. She appears well-developed and well-nourished. No distress.  HENT:  Head: Normocephalic and atraumatic.  Mouth/Throat: No oropharyngeal exudate.  Eyes: Pupils are equal, round, and reactive  to light. Conjunctivae are normal.  Neck: Normal range of motion. Neck supple.  Cardiovascular: Normal rate, regular rhythm, normal heart sounds and intact distal pulses.  Pulmonary/Chest: Effort normal and breath sounds normal. No stridor. She has no wheezes. She has no rales.  Abdominal: Soft. She exhibits no mass. There is no hepatosplenomegaly. There is no tenderness. There is no rebound, no guarding, no tenderness at McBurney's point and negative Murphy's sign. No hernia.  Gassy throughout  Musculoskeletal: Normal range of motion.  Neurological: She is alert and oriented to person, place, and time.  Skin: Skin is warm and dry. Capillary refill takes less than 2 seconds.  Psychiatric: She has a normal mood and affect.     ED Treatments / Results  Labs (all labs ordered are listed, but only abnormal results are displayed) Results for orders placed or performed during the hospital encounter of 01/10/18  Urinalysis, Routine w reflex microscopic  Result Value Ref Range   Color, Urine YELLOW YELLOW   APPearance CLEAR CLEAR   Specific Gravity, Urine 1.025 1.005 - 1.030   pH 6.5 5.0 - 8.0   Glucose, UA NEGATIVE NEGATIVE mg/dL   Hgb urine dipstick SMALL (A) NEGATIVE   Bilirubin Urine NEGATIVE NEGATIVE   Ketones, ur NEGATIVE NEGATIVE mg/dL   Protein, ur NEGATIVE NEGATIVE mg/dL   Nitrite NEGATIVE NEGATIVE   Leukocytes, UA NEGATIVE NEGATIVE  Pregnancy, urine  Result Value Ref Range   Preg Test, Ur NEGATIVE NEGATIVE  Urinalysis, Microscopic (reflex)  Result Value Ref Range   RBC / HPF 6-10 0 - 5 RBC/hpf   WBC, UA NONE SEEN 0 - 5 WBC/hpf   Bacteria, UA RARE (A) NONE SEEN   Squamous Epithelial / LPF 0-5 0 - 5   Dg Abd Acute W/chest  Result Date: 01/10/2018 CLINICAL DATA:  Lower mid pelvic pain for 2 months. Negative urine pregnancy test. EXAM: DG ABDOMEN ACUTE W/ 1V CHEST COMPARISON:  Chest 01/18/2011 FINDINGS: Shallow inspiration. Normal heart size and pulmonary vascularity. No focal  airspace disease or consolidation in the lungs. No blunting of costophrenic angles. No pneumothorax. Mediastinal contours appear intact. Gas and stool demonstrated throughout the colon. The descending colon gas shadow appears ahaustral. This may indicate inflammatory bowel disease such as ulcerative colitis. No small or large bowel distention. No free intra-abdominal air. No abnormal air-fluid levels. No radiopaque stones. Visualized bones appear intact. IMPRESSION: Shallow inspiration.  No evidence of active pulmonary disease. Nonobstructive bowel gas pattern. Stool and gas throughout the colon. The descending colon appears ahaustral. This may indicate inflammatory bowel disease such as ulcerative colitis Electronically Signed   By: Lucienne Capers M.D.   On: 01/10/2018 23:52   Ct Renal Stone Study  Result Date: 01/11/2018 CLINICAL DATA:  Midpelvic pain with some hematuria EXAM: CT ABDOMEN AND PELVIS WITHOUT CONTRAST TECHNIQUE: Multidetector CT imaging of the abdomen and pelvis was performed following the standard protocol without IV contrast. COMPARISON:  Radiograph 01/10/2018  FINDINGS: Lower chest: No acute abnormality. Hepatobiliary: No focal liver abnormality is seen. No gallstones, gallbladder wall thickening, or biliary dilatation. Pancreas: Unremarkable. No pancreatic ductal dilatation or surrounding inflammatory changes. Spleen: Normal in size without focal abnormality. Adrenals/Urinary Tract: Right adrenal gland is normal. 15 mm low-density nodule left adrenal gland consistent with adenoma. No hydronephrosis. No ureteral stone. Bladder normal. Stomach/Bowel: Stomach is within normal limits. Appendix appears normal. No evidence of bowel wall thickening, distention, or inflammatory changes. Vascular/Lymphatic: No significant vascular findings are present. No enlarged abdominal or pelvic lymph nodes. Reproductive: Uterus and bilateral adnexa are unremarkable. Other: Negative for free air or free fluid.  Musculoskeletal: Minimal superior endplate deformity H29 and L1 IMPRESSION: 1. Negative for hydronephrosis or ureteral stone. No CT evidence for acute intra-abdominal or pelvic abnormality. 2. 15 mm left adrenal gland adenoma Electronically Signed   By: Donavan Foil M.D.   On: 01/11/2018 01:06    EKG None  Radiology Dg Abd Acute W/chest  Result Date: 01/10/2018 CLINICAL DATA:  Lower mid pelvic pain for 2 months. Negative urine pregnancy test. EXAM: DG ABDOMEN ACUTE W/ 1V CHEST COMPARISON:  Chest 01/18/2011 FINDINGS: Shallow inspiration. Normal heart size and pulmonary vascularity. No focal airspace disease or consolidation in the lungs. No blunting of costophrenic angles. No pneumothorax. Mediastinal contours appear intact. Gas and stool demonstrated throughout the colon. The descending colon gas shadow appears ahaustral. This may indicate inflammatory bowel disease such as ulcerative colitis. No small or large bowel distention. No free intra-abdominal air. No abnormal air-fluid levels. No radiopaque stones. Visualized bones appear intact. IMPRESSION: Shallow inspiration.  No evidence of active pulmonary disease. Nonobstructive bowel gas pattern. Stool and gas throughout the colon. The descending colon appears ahaustral. This may indicate inflammatory bowel disease such as ulcerative colitis Electronically Signed   By: Lucienne Capers M.D.   On: 01/10/2018 23:52   Ct Renal Stone Study  Result Date: 01/11/2018 CLINICAL DATA:  Midpelvic pain with some hematuria EXAM: CT ABDOMEN AND PELVIS WITHOUT CONTRAST TECHNIQUE: Multidetector CT imaging of the abdomen and pelvis was performed following the standard protocol without IV contrast. COMPARISON:  Radiograph 01/10/2018 FINDINGS: Lower chest: No acute abnormality. Hepatobiliary: No focal liver abnormality is seen. No gallstones, gallbladder wall thickening, or biliary dilatation. Pancreas: Unremarkable. No pancreatic ductal dilatation or surrounding  inflammatory changes. Spleen: Normal in size without focal abnormality. Adrenals/Urinary Tract: Right adrenal gland is normal. 15 mm low-density nodule left adrenal gland consistent with adenoma. No hydronephrosis. No ureteral stone. Bladder normal. Stomach/Bowel: Stomach is within normal limits. Appendix appears normal. No evidence of bowel wall thickening, distention, or inflammatory changes. Vascular/Lymphatic: No significant vascular findings are present. No enlarged abdominal or pelvic lymph nodes. Reproductive: Uterus and bilateral adnexa are unremarkable. Other: Negative for free air or free fluid. Musculoskeletal: Minimal superior endplate deformity J24 and L1 IMPRESSION: 1. Negative for hydronephrosis or ureteral stone. No CT evidence for acute intra-abdominal or pelvic abnormality. 2. 15 mm left adrenal gland adenoma Electronically Signed   By: Donavan Foil M.D.   On: 01/11/2018 01:06    Procedures Procedures (including critical care time)  Medications Ordered in ED Medications  gi cocktail (Maalox,Lidocaine,Donnatal) (30 mLs Oral Given 01/11/18 0026)  benzonatate (TESSALON) capsule 200 mg (200 mg Oral Given 01/11/18 0026)  acetaminophen (TYLENOL) tablet 1,000 mg (1,000 mg Oral Given 01/11/18 0026)       Final Clinical Impressions(s) / ED Diagnoses   Final diagnoses:  Constipation, unspecified constipation type  Adrenal adenoma, unspecified laterality  Symptoms consistent with constipation and gas.  Recommend miralax daily and change in eating habits.  No fast food.  Drink more water.  Follow up with your PMD regarding adrenal adenoma.  This information was communicated with your verbally and was written on your discharge paperwork so you may take this to your follow up doctor's appointment.   Return for weakness, numbness, changes in vision or speech, fevers >100.4 unrelieved by medication, shortness of breath, intractable vomiting, or diarrhea, abdominal pain, Inability to  tolerate liquids or food, cough, altered mental status or any concerns. No signs of systemic illness or infection. The patient is nontoxic-appearing on exam and vital signs are within normal limits.   I have reviewed the triage vital signs and the nursing notes. Pertinent labs &imaging results that were available during my care of the patient were reviewed by me and considered in my medical decision making (see chart for details).  After history, exam, and medical workup I feel the patient has been appropriately medically screened and is safe for discharge home. Pertinent diagnoses were discussed with the patient. Patient was given return precautions. ED Discharge Orders        Ordered    polyethylene glycol powder (MIRALAX) powder  Daily     01/11/18 0118    simethicone (MYLICON) 40 ZO/1.0RU drops  4 times daily PRN     01/11/18 0118       Azeez Dunker, MD 01/11/18 0454

## 2018-02-06 ENCOUNTER — Encounter (HOSPITAL_COMMUNITY): Payer: Self-pay

## 2018-02-06 ENCOUNTER — Inpatient Hospital Stay (HOSPITAL_COMMUNITY)
Admission: AD | Admit: 2018-02-06 | Discharge: 2018-02-06 | Disposition: A | Discharge status: Home / Self Care | Payer: Medicaid Other | Source: Ambulatory Visit | Attending: Obstetrics & Gynecology | Admitting: Obstetrics & Gynecology

## 2018-02-06 DIAGNOSIS — F17290 Nicotine dependence, other tobacco product, uncomplicated: Secondary | ICD-10-CM | POA: Insufficient documentation

## 2018-02-06 DIAGNOSIS — L02214 Cutaneous abscess of groin: Secondary | ICD-10-CM | POA: Diagnosis not present

## 2018-02-06 DIAGNOSIS — L02426 Furuncle of left lower limb: Secondary | ICD-10-CM | POA: Diagnosis present

## 2018-02-06 HISTORY — DX: Benign neoplasm of unspecified adrenal gland: D35.00

## 2018-02-06 LAB — URINALYSIS, ROUTINE W REFLEX MICROSCOPIC
Bilirubin Urine: NEGATIVE
Glucose, UA: NEGATIVE mg/dL
Hgb urine dipstick: NEGATIVE
KETONES UR: NEGATIVE mg/dL
LEUKOCYTES UA: NEGATIVE
NITRITE: NEGATIVE
PROTEIN: NEGATIVE mg/dL
Specific Gravity, Urine: 1.025 (ref 1.005–1.030)
pH: 6 (ref 5.0–8.0)

## 2018-02-06 LAB — POCT PREGNANCY, URINE: PREG TEST UR: NEGATIVE

## 2018-02-06 MED ORDER — IBUPROFEN 600 MG PO TABS: 600.0000 mg | ORAL_TABLET | Freq: Four times a day (QID) | ORAL | 0 refills | Status: DC | PRN

## 2018-02-06 MED ORDER — LIDOCAINE HCL (PF) 1 % IJ SOLN
30.0000 mL | Freq: Once | INTRAMUSCULAR | Status: AC
  Administered 2018-02-06: 30 mL via INTRADERMAL
  Filled 2018-02-06: qty 30

## 2018-02-06 MED ORDER — TRAMADOL HCL 50 MG PO TABS: 50.0000 mg | ORAL_TABLET | Freq: Four times a day (QID) | ORAL | 0 refills | Status: DC | PRN

## 2018-02-06 MED ORDER — SULFAMETHOXAZOLE-TRIMETHOPRIM 800-160 MG PO TABS: 1.0000 | ORAL_TABLET | Freq: Two times a day (BID) | ORAL | 0 refills | Status: DC

## 2018-02-06 MED ORDER — OXYCODONE-ACETAMINOPHEN 5-325 MG PO TABS
2.0000 | ORAL_TABLET | Freq: Once | ORAL | Status: AC
  Administered 2018-02-06: 2 via ORAL
  Filled 2018-02-06: qty 2

## 2018-02-06 NOTE — MAU Note (Signed)
Boil on top inner thigh on left leg. Has been getting bigger. Has had a bump in that area before and reports it stayed small and gone away. Reports it is very painful and hard to walk

## 2018-02-06 NOTE — MAU Provider Note (Signed)
Chief Complaint: Boil on inner thigh   First Provider Initiated Contact with Patient 02/06/18 1307      SUBJECTIVE HPI: Diana Fuentes is a 27 y.o. G1P1001 who presents to maternity admissions reporting boil on her left inner thigh/groin area that is large and painful, making it difficult to walk. She first noticed a small painless bump in the area 3-4 months ago that resolved without treatment. Then 1 week ago she developed a painful bump that has gotten bigger and more painful since onset.  The pain is stabbing pain, worse with walking, and not improved with ibuprofen 400 mg.  She went to Urgent Care 2 days ago and was started on doxycycline 100 mg BID x 10 days.  She is not improved on the medication.  There are no other associated symptoms. She has not tried any other treatments.   HPI  Past Medical History:  Diagnosis Date  . Adrenal adenoma   . Ear infection    Past Surgical History:  Procedure Laterality Date  . WISDOM TOOTH EXTRACTION     Social History   Socioeconomic History  . Marital status: Married    Spouse name: Not on file  . Number of children: Not on file  . Years of education: Not on file  . Highest education level: Not on file  Occupational History  . Not on file  Social Needs  . Financial resource strain: Not on file  . Food insecurity:    Worry: Not on file    Inability: Not on file  . Transportation needs:    Medical: Not on file    Non-medical: Not on file  Tobacco Use  . Smoking status: Current Every Day Smoker  . Smokeless tobacco: Current User  . Tobacco comment: vape  Substance and Sexual Activity  . Alcohol use: No  . Drug use: No  . Sexual activity: Not on file  Lifestyle  . Physical activity:    Days per week: Not on file    Minutes per session: Not on file  . Stress: Not on file  Relationships  . Social connections:    Talks on phone: Not on file    Gets together: Not on file    Attends religious service: Not on file    Active  member of club or organization: Not on file    Attends meetings of clubs or organizations: Not on file    Relationship status: Not on file  . Intimate partner violence:    Fear of current or ex partner: Not on file    Emotionally abused: Not on file    Physically abused: Not on file    Forced sexual activity: Not on file  Other Topics Concern  . Not on file  Social History Narrative  . Not on file   No current facility-administered medications on file prior to encounter.    No current outpatient medications on file prior to encounter.   No Known Allergies  ROS:  Review of Systems  Constitutional: Negative for chills, fatigue and fever.  Respiratory: Negative for shortness of breath.   Cardiovascular: Negative for chest pain.  Genitourinary: Negative for difficulty urinating, dysuria, flank pain, pelvic pain, vaginal bleeding, vaginal discharge and vaginal pain.  Skin: Positive for wound.  Neurological: Negative for dizziness and headaches.  Psychiatric/Behavioral: Negative.      I have reviewed patient's Past Medical Hx, Surgical Hx, Family Hx, Social Hx, medications and allergies.   Physical Exam   Patient Vitals for  the past 24 hrs:  BP Temp Temp src Pulse Resp Height Weight  02/06/18 1604 115/70 - - 85 - - -  02/06/18 1222 111/71 98.3 F (36.8 C) Oral 85 18 - -  02/06/18 1206 - - - - - 5\' 5"  (1.651 m) 209 lb 12 oz (95.1 kg)   Constitutional: Well-developed, well-nourished female in no acute distress.  Cardiovascular: normal rate Respiratory: normal effort GI: Abd soft, non-tender. Pos BS x 4 MS: Extremities nontender, no edema, normal ROM Neurologic: Alert and oriented x 4.  GU: Neg CVAT.  On visual inspection,in pt left groin area separate from labia, pt has large 3 x 3 cm raised area with erythema with multiple less than 0.5-1cm white pustules across the surface and surrounding area of 4 x 6 area of firmness and tenderness and erythema but skin not raised.    INCISION AND DRAINAGE Performed by: Fatima Blank Consent: Verbal consent obtained. Risks and benefits: risks, benefits and alternatives were discussed Time out performed prior to procedure Type: abscess Body area: left groin Anesthesia: local infiltration Incision was made with a scalpel. Local anesthetic: lidocaine 1%  Anesthetic total: 10 ml Complexity: complex Blunt dissection to break up loculations Drainage: serosanguinous Drainage amount: ~30 ml Packing material: n/a Patient tolerance: Patient tolerated the procedure well with no immediate complications.     LAB RESULTS Results for orders placed or performed during the hospital encounter of 02/06/18 (from the past 24 hour(s))  Urinalysis, Routine w reflex microscopic     Status: Abnormal   Collection Time: 02/06/18 12:03 PM  Result Value Ref Range   Color, Urine YELLOW YELLOW   APPearance HAZY (A) CLEAR   Specific Gravity, Urine 1.025 1.005 - 1.030   pH 6.0 5.0 - 8.0   Glucose, UA NEGATIVE NEGATIVE mg/dL   Hgb urine dipstick NEGATIVE NEGATIVE   Bilirubin Urine NEGATIVE NEGATIVE   Ketones, ur NEGATIVE NEGATIVE mg/dL   Protein, ur NEGATIVE NEGATIVE mg/dL   Nitrite NEGATIVE NEGATIVE   Leukocytes, UA NEGATIVE NEGATIVE  Pregnancy, urine POC     Status: None   Collection Time: 02/06/18 12:30 PM  Result Value Ref Range   Preg Test, Ur NEGATIVE NEGATIVE       IMAGING   MAU Management/MDM: Pt with abscess in left groin and without systemic symptoms.  Initially, abcess was fluctuant but some drainage occurred in MAU after pt walked to the bathroom prior to I&D procedure.  I&D with limited results, no purulent drainage with areas of solid material/loculations that do not break up or soften.  Wound culture pending.  Pt to switch abx to Bactrim DS for better streptococcal coverage and return to MAU if not improved in 48 hours.  Message sent to [facility] office for follow up with MD in 2 weeks to consider additional  procedure or refer to specialist/primary care if not improved.  Pt also to establish with primary care and may choose to follow up there.  Treatments in MAU included Percocet given pre-procedure.  Rx for Bactrim DS BID x 7 days, ibuprofen 600 mg PO Q 6 hours, and Tramadol 50 mg Q 6 hours PRN x 10 tabs only. Pt discharged with strict return precautions.  ASSESSMENT 1. Abscess of groin, left     PLAN Discharge home Allergies as of 02/06/2018   No Known Allergies     Medication List    STOP taking these medications   CIPRODEX OT   ciprofloxacin 500 MG tablet Commonly known as:  CIPRO  cyclobenzaprine 10 MG tablet Commonly known as:  FLEXERIL   doxycycline 100 MG capsule Commonly known as:  VIBRAMYCIN   naproxen 375 MG tablet Commonly known as:  NAPROSYN   polyethylene glycol powder powder Commonly known as:  MIRALAX   simethicone 40 MG/0.6ML drops Commonly known as:  MYLICON     TAKE these medications   ibuprofen 600 MG tablet Commonly known as:  ADVIL,MOTRIN Take 1 tablet (600 mg total) by mouth every 6 (six) hours as needed. What changed:    medication strength  how much to take  when to take this  reasons to take this   sulfamethoxazole-trimethoprim 800-160 MG tablet Commonly known as:  BACTRIM DS,SEPTRA DS Take 1 tablet by mouth 2 (two) times daily.   traMADol 50 MG tablet Commonly known as:  ULTRAM Take 1 tablet (50 mg total) by mouth every 6 (six) hours as needed.      Follow-up [facility] for [facility] Follow up.   Specialty:  Obstetrics and Gynecology Why:  The office will call you with follow up appointment. Return to MAU if symptoms do not improve in 48 hours.  Return sooner for emergencies. Contact information: [place] Certified Nurse-Midwife 02/06/2018  4:25 PM

## 2018-02-09 LAB — AEROBIC CULTURE  (SUPERFICIAL SPECIMEN)

## 2018-02-09 LAB — AEROBIC CULTURE W GRAM STAIN (SUPERFICIAL SPECIMEN): Culture: NORMAL

## 2018-02-24 ENCOUNTER — Ambulatory Visit (INDEPENDENT_AMBULATORY_CARE_PROVIDER_SITE_OTHER): Payer: Self-pay | Admitting: Obstetrics and Gynecology

## 2018-02-24 ENCOUNTER — Encounter: Payer: Self-pay | Admitting: Obstetrics and Gynecology

## 2018-02-24 VITALS — BP 120/69 | HR 78 | Ht 65.0 in | Wt 212.1 lb

## 2018-02-24 DIAGNOSIS — Z3169 Encounter for other general counseling and advice on procreation: Secondary | ICD-10-CM

## 2018-02-24 DIAGNOSIS — Z9889 Other specified postprocedural states: Secondary | ICD-10-CM

## 2018-02-24 NOTE — Progress Notes (Signed)
Obstetrics and Gynecology Visit Return Patient Evaluation  Appointment Date: 02/24/2018  Primary Care Provider: [facility]  Chief Complaint: MAU follow up, infertility questions  History of Present Illness:  Diana Fuentes is a 27 y.o. G1P1 s/p 5/26 MAU visit for left inner thigh boil that was I&D. Pt placed on bactrim and culture was unremarkable.  Patient states area feels much improved.   Pt has been with same partner for several years and hasn't gotten pregnant and is wondering about this. Her child has a different FOB and he has no children and no surgeries or medical problems in him except he does vape. No timed intercourse. She does have qmonth regular periods.   Review of Systems:  as noted in the History of Present Illness.   Medications: None Allergies: has No Known Allergies.  Physical Exam:  BP 120/69   Pulse 78   Ht 5\' 5"  (1.651 m)   Wt 212 lb 1.6 oz (96.2 kg)   LMP 02/09/2018 (Approximate)   BMI 35.30 kg/m  Body mass index is 35.3 kg/m. General appearance: Well nourished, well developed female in no acute distress.  Neuro/Psych:  Normal mood and affect.   Left thigh with well healed area and no e/o infection or separation  Assessment: pt doing well   Plan: Well healed I&D site  D/w her re: timed intercourse and to start folic acid. I told her if no pregnancy in 3-53m for him to get a semen analysis at a fertility center and can call us for w/u, which I'd recommend include and HSG given her h/o CT.   RTC: PRN  Durene Romans MD Attending Center for [facility] [facility]

## 2018-02-24 NOTE — Patient Instructions (Signed)
If you smoke, stop smoking  Start taking folic acid 1mg  every day or a woman's once a day vitamin  Start doing timed intercourse (having sex every other day for a week starting on the 10th day of your cycle). Do that for 3-4 months and if you don't get pregnant, then call for an appointment with [place] and have your husband get a semen analysis at a fertility center.

## 2018-07-12 ENCOUNTER — Emergency Department (HOSPITAL_BASED_OUTPATIENT_CLINIC_OR_DEPARTMENT_OTHER): Payer: Medicaid Other

## 2018-07-12 ENCOUNTER — Other Ambulatory Visit: Payer: Self-pay

## 2018-07-12 ENCOUNTER — Emergency Department (HOSPITAL_BASED_OUTPATIENT_CLINIC_OR_DEPARTMENT_OTHER)
Admission: EM | Admit: 2018-07-12 | Discharge: 2018-07-12 | Disposition: A | Discharge status: Home / Self Care | Payer: Medicaid Other | Attending: Emergency Medicine | Admitting: Emergency Medicine

## 2018-07-12 ENCOUNTER — Encounter (HOSPITAL_BASED_OUTPATIENT_CLINIC_OR_DEPARTMENT_OTHER): Payer: Self-pay

## 2018-07-12 DIAGNOSIS — F17290 Nicotine dependence, other tobacco product, uncomplicated: Secondary | ICD-10-CM | POA: Insufficient documentation

## 2018-07-12 DIAGNOSIS — N858 Other specified noninflammatory disorders of uterus: Secondary | ICD-10-CM | POA: Insufficient documentation

## 2018-07-12 DIAGNOSIS — N949 Unspecified condition associated with female genital organs and menstrual cycle: Secondary | ICD-10-CM

## 2018-07-12 DIAGNOSIS — G8929 Other chronic pain: Secondary | ICD-10-CM

## 2018-07-12 DIAGNOSIS — R102 Pelvic and perineal pain: Secondary | ICD-10-CM

## 2018-07-12 LAB — URINALYSIS, ROUTINE W REFLEX MICROSCOPIC
Bilirubin Urine: NEGATIVE
Glucose, UA: NEGATIVE mg/dL
KETONES UR: NEGATIVE mg/dL
Leukocytes, UA: NEGATIVE
NITRITE: NEGATIVE
PROTEIN: NEGATIVE mg/dL
Specific Gravity, Urine: 1.025 (ref 1.005–1.030)
pH: 6.5 (ref 5.0–8.0)

## 2018-07-12 LAB — WET PREP, GENITAL
Clue Cells Wet Prep HPF POC: NONE SEEN
SPERM: NONE SEEN
Trich, Wet Prep: NONE SEEN
Yeast Wet Prep HPF POC: NONE SEEN

## 2018-07-12 LAB — URINALYSIS, MICROSCOPIC (REFLEX)

## 2018-07-12 LAB — PREGNANCY, URINE: Preg Test, Ur: NEGATIVE

## 2018-07-12 MED ORDER — NAPROXEN 250 MG PO TABS
500.0000 mg | ORAL_TABLET | Freq: Once | ORAL | Status: AC
  Administered 2018-07-12: 500 mg via ORAL
  Filled 2018-07-12: qty 2

## 2018-07-12 MED ORDER — CEFTRIAXONE SODIUM 250 MG IJ SOLR
250.0000 mg | Freq: Once | INTRAMUSCULAR | Status: AC
  Administered 2018-07-12: 250 mg via INTRAMUSCULAR
  Filled 2018-07-12: qty 250

## 2018-07-12 MED ORDER — DOXYCYCLINE HYCLATE 100 MG PO CAPS: 100.0000 mg | ORAL_CAPSULE | Freq: Two times a day (BID) | ORAL | 0 refills | Status: AC

## 2018-07-12 MED ORDER — NAPROXEN 500 MG PO TABS: 500.0000 mg | ORAL_TABLET | Freq: Two times a day (BID) | ORAL | 0 refills | Status: DC

## 2018-07-12 NOTE — ED Triage Notes (Addendum)
C/o lower abd pain x today-denies n/v/d and vaginal d/c-NAD-steady gait

## 2018-07-12 NOTE — ED Notes (Signed)
Pt/family verbalized understanding of discharge instructions.   

## 2018-07-12 NOTE — Discharge Instructions (Addendum)
You are seen in the ER for chronic pelvic pain.  You had some pain with movement of your cervix.  This can suggest pelvic inflammatory disease.  This is treated with antibiotics.  Take doxycycline as prescribed.  Additionally take naproxen every 12 hours for pain.  You were unable to stay in the ER for an ultrasound.  This can be done as an outpatient by OB/GYN.  Return to the ER if there is fevers, chills, blood in your vomit or stool, constant or localized pain to the right upper right lower abdomen, urinary symptoms, abnormal vaginal bleeding.

## 2018-07-12 NOTE — ED Provider Notes (Signed)
[facility] EMERGENCY DEPARTMENT Provider Note   CSN: [id number] Arrival date & time: 07/12/18  1340     History   Chief Complaint Chief Complaint  Patient presents with  . Abdominal Pain    HPI Diana Fuentes is a 27 y.o. female with history of chlamydia and gonorrhea is here for evaluation of lower suprapubic abdominal pain onset 1 year ago.  The pain is described as sharp, intermittent, sporadic that radiates into her vagina.  Occasionally the pain begins after intercourse although it also happens without intercourse.  The pain initially is a dull, tearing pain and it worsens into more of a sharp and severe pain. When active the pain is significantly worse with laying flat, walking, sitting down and certain movements.  Sometimes she can't stand up straight from pain.  Has had associated intermittent nausea and nbnb emesis, intermittent constipation and looser BMs for up to one year; urinary frequency and urgency for a couple of months not worsening.  LMP 10/27 currently menstruating.  Has tried ibuprofen without relief.    She denies abdominal surgeries, h/o PID, endometriosis, fibroids or cysts.  No associated fevers, chills, hematemesis, blood in stools, abnormal vaginal discharge or bleeding/spotting.  She is in a monogamous relationship with husband, sexually active with one partner only without condom use. She is not concerned about STDs.    HPI  Past Medical History:  Diagnosis Date  . Adrenal adenoma   . Chlamydia   . Ear infection     There are no active problems to display for this patient.   Past Surgical History:  Procedure Laterality Date  . WISDOM TOOTH EXTRACTION       OB History    Gravida  1   Para  1   Term  1   Preterm      AB      Living  1     SAB      TAB      Ectopic      Multiple      Live Births               Home Medications    Prior to Admission medications   Medication Sig Start Date End Date Taking?  Authorizing Provider  doxycycline (VIBRAMYCIN) 100 MG capsule Take 1 capsule (100 mg total) by mouth 2 (two) times daily for 14 days. 07/12/18 07/26/18  Kinnie Feil, PA-C  ibuprofen (ADVIL,MOTRIN) 600 MG tablet Take 1 tablet (600 mg total) by mouth every 6 (six) hours as needed. 02/06/18   Leftwich-Kirby, Kathie Dike, CNM  naproxen (NAPROSYN) 500 MG tablet Take 1 tablet (500 mg total) by mouth 2 (two) times daily. 07/12/18   Kinnie Feil, PA-C  traMADol (ULTRAM) 50 MG tablet Take 1 tablet (50 mg total) by mouth every 6 (six) hours as needed. Patient not taking: Reported on 02/24/2018 02/06/18   Elvera Maria, CNM    Family History Family History  Problem Relation Age of Onset  . Diabetes Maternal Aunt   . Diabetes Maternal Uncle   . Diabetes Maternal Grandmother     Social History Social History   Tobacco Use  . Smoking status: Current Every Day Smoker  . Smokeless tobacco: Current User  . Tobacco comment: vape  Substance Use Topics  . Alcohol use: No  . Drug use: No     Allergies   Patient has no known allergies.   Review of Systems Review of Systems  Gastrointestinal: Positive  for abdominal pain, constipation, diarrhea, nausea and vomiting.  Genitourinary: Positive for frequency, urgency and vaginal pain.  All other systems reviewed and are negative.    Physical Exam Updated Vital Signs BP 115/69 (BP Location: Right Arm)   Pulse 75   Temp 99 F (37.2 C) (Oral)   Resp 18   Ht 5\' 5"  (1.651 m)   Wt 96.2 kg   LMP 07/10/2018   SpO2 98%   BMI 35.28 kg/m   Physical Exam  Constitutional: She is oriented to person, place, and time. She appears well-developed and well-nourished.  Non toxic  HENT:  Head: Normocephalic and atraumatic.  Nose: Nose normal.  Eyes: Pupils are equal, round, and reactive to light. Conjunctivae and EOM are normal.  Neck: Normal range of motion.  Cardiovascular: Normal rate and regular rhythm.  Pulmonary/Chest: Effort  normal and breath sounds normal.  Abdominal: Soft. Bowel sounds are normal. There is tenderness in the suprapubic area.  Very low suprapubic tenderness. No G/R/R. No CVA tenderness. Negative Murphy's and McBurney's. Active BS to lower quadrants.   Genitourinary: Uterus is tender. Cervix exhibits motion tenderness. There is bleeding in the vagina.  Genitourinary Comments: External genitalia normal without lesions. No groin lymphadenopathy.  Vaginal mucosa and cervix norma pink without moderate amount of dark blood in vault.  Uterus tender. +CMT. Non palpable adnexa. Perianal skin normal.   Musculoskeletal: Normal range of motion.  Neurological: She is alert and oriented to person, place, and time.  Skin: Skin is warm and dry. Capillary refill takes less than 2 seconds.  Psychiatric: She has a normal mood and affect. Her behavior is normal.  Nursing note and vitals reviewed.    ED Treatments / Results  Labs (all labs ordered are listed, but only abnormal results are displayed) Labs Reviewed  WET PREP, GENITAL - Abnormal; Notable for the following components:      Result Value   WBC, Wet Prep HPF POC MANY (*)    All other components within normal limits  URINALYSIS, ROUTINE W REFLEX MICROSCOPIC - Abnormal; Notable for the following components:   Hgb urine dipstick MODERATE (*)    All other components within normal limits  URINALYSIS, MICROSCOPIC (REFLEX) - Abnormal; Notable for the following components:   Bacteria, UA FEW (*)    All other components within normal limits  PREGNANCY, URINE  GC/CHLAMYDIA PROBE AMP () NOT AT [facility]    EKG None  Radiology No results found.  Procedures Procedures (including critical care time)  Medications Ordered in ED Medications  naproxen (NAPROSYN) tablet 500 mg (500 mg Oral Given 07/12/18 1703)  cefTRIAXone (ROCEPHIN) injection 250 mg (250 mg Intramuscular Given 07/12/18 1703)     Initial Impression / Assessment and Plan / ED  Course  I have reviewed the triage vital signs and the nursing notes.  Pertinent labs & imaging results that were available during my care of the patient were reviewed by me and considered in my medical decision making (see chart for details).  Clinical Course as of Jul 13 1955  Tue Jul 12, 2018  1532 Hgb urine dipstickMarland Kitchen): MODERATE [CG]  1532 Bacteria, UA(!): FEW [CG]  1809 RN notified me pt needs to leave.  I spoke to pt. Stated we were waiting for [place]. I feel it is reasonable to have this done as outpatient.    [CG]    Clinical Course User Index [CG] Kinnie Feil, PA-C    Considering PID vs fibroid vs cyst.  Unlikely to  be ectopic, UTI, torsion due to chronicity.  Endometriosis less likely, no fmh or abnormal vaginal bleed.  Gi or renal stone etiology is lower on differential.  She was seen in ER 6 months ago for this pain and had normal CT renal and KUB, thought to be from constipation.   1654: Pelvic exam with mild CMT and tender uterus.  No adenxal fullness or tenderness. Given chronicity, persistent pain, pelvic US reasonable.   1955: Pt could not stay for [place]. Recommended OBGYN f/u for further management/work up of chronic pelvic pain.  Will tx for PID given sexual practices, CMT, chronicity of pelvic pain and symptoms.  Discussed this with pt who is in agreement.   Final Clinical Impressions(s) / ED Diagnoses   Final diagnoses:  Chronic pelvic pain in female  Cervical motion tenderness    ED Discharge Orders         Ordered    doxycycline (VIBRAMYCIN) 100 MG capsule  2 times daily     07/12/18 1814    naproxen (NAPROSYN) 500 MG tablet  2 times daily     07/12/18 1815           Arlean Hopping 07/12/18 1956    Little, Wenda Overland, MD 07/12/18 [phone]

## 2018-07-13 LAB — GC/CHLAMYDIA PROBE AMP (~~LOC~~) NOT AT ARMC
Chlamydia: NEGATIVE
Neisseria Gonorrhea: NEGATIVE

## 2018-07-26 ENCOUNTER — Encounter: Payer: Self-pay | Admitting: Student

## 2018-07-26 ENCOUNTER — Ambulatory Visit (INDEPENDENT_AMBULATORY_CARE_PROVIDER_SITE_OTHER): Payer: Self-pay | Admitting: Student

## 2018-07-26 VITALS — BP 112/77 | HR 71 | Wt 207.0 lb

## 2018-07-26 DIAGNOSIS — R102 Pelvic and perineal pain: Secondary | ICD-10-CM

## 2018-07-26 NOTE — Progress Notes (Signed)
  Subjective:     Patient ID: Diana Fuentes, female   DOB: Apr 18, 1991, 27 y.o.   MRN: [id number]  HPI Patient Diana Fuentes is a 27 y.o. G1P1001 Here with complaint of pelvic pain that is on and off for a year.  Patient states that she first had pelvic pain with an IUD at 27 years old. She had the IUD for a year; it was hurting her so she had it taken out. She says that when she got it taken out, "he had to tug on it more than normal".  Since then, she has had no pain.  She took birth control pills after her IUD was out, but not the whole time. A year ago, she started having pain with intercourse with her husband. It was so bad she was in the fetal position. Since then, she has had pain sometimes with intercourse or not. It seems to be related to a "certain spot" That his penis touches. She indicates its "in her uterus".   She has regular cycles, which are "normal" flow and last 6 days. Sometimes heavy clots. She feels the pain sometimes with her menstrual cycle which she describes as filling up with blood. The pain is not before her period; she says she gets cramps but they are "normal".  Constipation makes it worse. Sometimes she is afraid to actually go to the bathroom.   Apart from her period, she also gets the pain when she is not menstruating. It lasts a couple hours to a whole day. It gets worse if she moves around. Hot shower calms it down; she has not tried ibuprofen or tylenol.  She was recently seen in the [facility] for this pain (10-29); she could not stay for an [place]. She was treated for GC at that visit with  Rocephin and Vibramycin.   She does not take birth control.  Review of Systems  Constitutional: Negative.   HENT: Negative.   Respiratory: Negative.   Cardiovascular: Negative.   Genitourinary: Positive for pelvic pain.  Musculoskeletal: Negative.   Hematological: Negative.   Psychiatric/Behavioral: Negative.        Objective:   Physical Exam  Constitutional: She appears  well-developed.  HENT:  Head: Normocephalic.  Neck: Normal range of motion.  Pulmonary/Chest: Effort normal.  Abdominal: Soft. She exhibits no distension and no mass. There is no tenderness. There is no rebound and no guarding.  Genitourinary:  Genitourinary Comments: NEFG; pap deferred today. Bimanual exam benign; no masses, CMT, suprapubic or adnexal tenderness.   Neurological: She is alert.       Assessment:      1. Pelvic pain    2. Differentials include fibroids, endometriosis, scarring from PID.     Plan:     -Pelvic US ordered, patient given number and directions to call for sonohystogram when she gets her period.  Pelvic US and transvag US ordered. Explained to patient that without imaging it is hard to know best course of action; patient understands this and will keep appt for imaging.   -Patient to return after [place] and [place] for appt with MD. -Patient does not want birth control at this time. -Patient will try ibuprofen for pain.  -Patient to have pap smear at next visit.  Maye Hides

## 2018-07-26 NOTE — Patient Instructions (Signed)
Sonohysterogram A sonohysterogram is a procedure to examine the inside of the uterus. This exam uses sound waves that are sent to a computer to make images of the lining of the uterus (endometrium). To get the best images, a germ-free, salt-water solution (sterile saline) is put into the uterus through the vagina. You may have this procedure if you have certain reproductive problems, such as abnormal bleeding, infertility, or miscarriage. This procedure can show what may be causing these problems. Possible causes include scarring or abnormal growths such as fibroids inside your uterus. It can also show if your uterus is an abnormal shape or if the lining of the uterus is too thin. Tell a health care provider about:  All medicines you are taking, including vitamins, herbs, eye drops, creams, and over-the-counter medicines.  Any allergies you have.  Any blood disorders you have.  Any surgeries you have had.  Any medical conditions you have.  Whether you are pregnant or may be pregnant.  The date of the first day of your last period.  Any signs of infection, such as fever, pain in your lower abdomen, or abnormal discharge from your vagina. What are the risks? Generally, this is a safe procedure. However, problems may occur, including:  Abdominal pain or cramping.  Light bleeding (spotting).  Increased vaginal discharge.  Infection. What happens before the procedure?  Your health care provider may have you take an over-the-counter pain medicine.  You may be given medicine to stop any abnormal bleeding.  You may be given antibiotic medicine to help prevent infection.  You may be asked to take a pregnancy test. This is usually in the form of a urine test.  You may have a pelvic exam.  You will be asked to empty your bladder. What happens during the procedure?  You will lie down on the exam table with your feet in stirrups or with your knees bent and your feet flat on the  table.  A slender, handheld device (transducer) will be lubricated and placed into your vagina.  The transducer will be positioned to send sound waves to your uterus. The sound waves are sent to a computer and are turned into images, which your health care provider sees during the procedure.  The transducer will be removed from your vagina.  An instrument will be inserted to widen the opening of your vagina (speculum).  A swab with germ-killing solution (antiseptic) will be used to clean the opening to your uterus (cervix).  A long, thin tube (catheter) will be placed through your cervix into your uterus.  The speculum will be removed.  The transducer will be placed back into your vagina to take more images.  Your uterus will be filled with a germ-free, salt-water solution (sterile saline) through the catheter. You may feel some cramping.  A fluid that contains air bubbles may be sent through the catheter to make it easier to see the fallopian tubes.  The transducer and catheter will be removed. The procedure may vary among health care providers and hospitals. What happens after the procedure?  It is up to you to get the results of your procedure. Ask your health care provider, or the department that is doing the procedure, when your results will be ready. Summary  A sonohysterogram is a procedure that creates images of the inside of the uterus.  The risks of this procedure are very low. Most women experience cramping and spotting after the procedure.  You may need to have a   pelvic exam and take a pregnancy test before this procedure. This procedure will not be done if you are pregnant or have an infection. This information is not intended to replace advice given to you by your health care provider. Make sure you discuss any questions you have with your health care provider. Document Released: 01/15/2014 Document Revised: 07/27/2016 Document Reviewed: 07/27/2016 Elsevier  Interactive Patient Education  2017 Elsevier Inc.  

## 2018-08-02 ENCOUNTER — Encounter (HOSPITAL_COMMUNITY): Payer: Self-pay

## 2018-08-02 ENCOUNTER — Ambulatory Visit (HOSPITAL_COMMUNITY)
Admission: RE | Admit: 2018-08-02 | Discharge: 2018-08-02 | Disposition: A | Discharge status: Home / Self Care | Payer: Medicaid Other | Source: Ambulatory Visit | Attending: Student | Admitting: Student

## 2018-08-02 DIAGNOSIS — R102 Pelvic and perineal pain: Secondary | ICD-10-CM | POA: Insufficient documentation

## 2018-08-24 ENCOUNTER — Encounter: Payer: Self-pay | Admitting: Obstetrics & Gynecology

## 2018-08-24 ENCOUNTER — Other Ambulatory Visit (HOSPITAL_COMMUNITY)
Admission: RE | Admit: 2018-08-24 | Discharge: 2018-08-24 | Disposition: A | Discharge status: Home / Self Care | Payer: Medicaid Other | Source: Ambulatory Visit | Attending: Obstetrics & Gynecology | Admitting: Obstetrics & Gynecology

## 2018-08-24 ENCOUNTER — Ambulatory Visit (INDEPENDENT_AMBULATORY_CARE_PROVIDER_SITE_OTHER): Payer: Self-pay | Admitting: Obstetrics & Gynecology

## 2018-08-24 VITALS — BP 118/69 | HR 81 | Ht 65.0 in | Wt 207.7 lb

## 2018-08-24 DIAGNOSIS — Z0000 Encounter for general adult medical examination without abnormal findings: Secondary | ICD-10-CM

## 2018-08-24 DIAGNOSIS — R102 Pelvic and perineal pain: Secondary | ICD-10-CM

## 2018-08-24 NOTE — Progress Notes (Signed)
Patient ID: Diana Fuentes, female   DOB: Feb 02, 1991, 27 y.o.   MRN: [id number]  Chief Complaint  Patient presents with  . Follow-up    HPI Diana Fuentes is a 27 y.o. married P1 (30yo son) here today for followup for pelvic pain, dysmenorrhea, and some dyspareunia for about a year. She can trigger the pain by pushing in on her lower pelvis. Her u/s showed a 1.4 cm right possible hydrosalpinx. She had a h/o GC at about age 27. She is wanting a pregnancy so she does not want OCPs.   Past Medical History:  Diagnosis Date  . Adrenal adenoma   . Chlamydia   . Ear infection   . Medical history non-contributory     Past Surgical History:  Procedure Laterality Date  . WISDOM TOOTH EXTRACTION      Family History  Problem Relation Age of Onset  . Diabetes Maternal Aunt   . Diabetes Maternal Uncle   . Diabetes Maternal Grandmother     Social History Social History   Tobacco Use  . Smoking status: Current Every Day Smoker  . Smokeless tobacco: Current User  . Tobacco comment: vape  Substance Use Topics  . Alcohol use: No  . Drug use: No    Allergies  Allergen Reactions  . Shellfish Allergy Rash and Shortness Of Breath    Current Outpatient Medications  Medication Sig Dispense Refill  . ibuprofen (ADVIL,MOTRIN) 600 MG tablet Take 1 tablet (600 mg total) by mouth every 6 (six) hours as needed. 30 tablet 0  . naproxen (NAPROSYN) 500 MG tablet Take 1 tablet (500 mg total) by mouth 2 (two) times daily. (Patient not taking: Reported on 08/24/2018) 30 tablet 0  . traMADol (ULTRAM) 50 MG tablet Take 1 tablet (50 mg total) by mouth every 6 (six) hours as needed. (Patient not taking: Reported on 02/24/2018) 10 tablet 0   No current facility-administered medications for this visit.     Review of Systems Review of Systems She got Gardasil when younger. Blood pressure 118/69, pulse 81, height 5\' 5"  (1.651 m), weight 207 lb 11.2 oz (94.2 kg), last menstrual period  08/12/2018.  Physical Exam Physical Exam Breathing, conversing, and ambulating normally Well nourished, well hydrated Latina, no apparent distress  normal size and shape, anteverted, mobile, non-tender, normal adnexal exam   Data Reviewed Ultrasound findings above  Assessment    Preventative care CPP    Plan    Since she does not want OCPs, I have offered a diagnostic laparoscopy, discussed a 67% chance of finding the etiology but 33% chance of no etiology being found.  Rec MVI daily She declines flu, TDAP   pap smear    Tally Mattox C Khristine Verno 08/24/2018, 9:53 AM

## 2018-08-26 LAB — CYTOLOGY - PAP: Diagnosis: NEGATIVE

## 2018-08-30 ENCOUNTER — Encounter (HOSPITAL_COMMUNITY): Payer: Self-pay

## 2018-10-27 ENCOUNTER — Encounter (HOSPITAL_BASED_OUTPATIENT_CLINIC_OR_DEPARTMENT_OTHER): Payer: Self-pay | Admitting: *Deleted

## 2018-10-27 ENCOUNTER — Other Ambulatory Visit: Payer: Self-pay

## 2018-11-01 ENCOUNTER — Encounter (HOSPITAL_BASED_OUTPATIENT_CLINIC_OR_DEPARTMENT_OTHER)
Admission: RE | Admit: 2018-11-01 | Discharge: 2018-11-01 | Disposition: A | Discharge status: Home / Self Care | Payer: Medicaid Other | Source: Ambulatory Visit | Attending: Obstetrics & Gynecology | Admitting: Obstetrics & Gynecology

## 2018-11-01 DIAGNOSIS — Z01812 Encounter for preprocedural laboratory examination: Secondary | ICD-10-CM | POA: Insufficient documentation

## 2018-11-01 LAB — POCT PREGNANCY, URINE: Preg Test, Ur: NEGATIVE

## 2018-11-01 NOTE — Progress Notes (Addendum)
Reminded pt to come for PAT appointment, pt states will come in sometime today.  Ensure pre surgery drink given with instructions to complete by [facility], pt verbalized understanding.

## 2018-11-02 ENCOUNTER — Ambulatory Visit (HOSPITAL_BASED_OUTPATIENT_CLINIC_OR_DEPARTMENT_OTHER): Payer: Medicaid Other | Admitting: Certified Registered"

## 2018-11-02 ENCOUNTER — Other Ambulatory Visit: Payer: Self-pay

## 2018-11-02 ENCOUNTER — Encounter (HOSPITAL_BASED_OUTPATIENT_CLINIC_OR_DEPARTMENT_OTHER)
Admission: RE | Disposition: A | Discharge status: Home / Self Care | Payer: Self-pay | Source: Ambulatory Visit | Attending: Obstetrics & Gynecology

## 2018-11-02 ENCOUNTER — Ambulatory Visit (HOSPITAL_BASED_OUTPATIENT_CLINIC_OR_DEPARTMENT_OTHER)
Admission: RE | Admit: 2018-11-02 | Discharge: 2018-11-02 | Disposition: A | Discharge status: Home / Self Care | Payer: Medicaid Other | Source: Ambulatory Visit | Attending: Obstetrics & Gynecology | Admitting: Obstetrics & Gynecology

## 2018-11-02 ENCOUNTER — Encounter (HOSPITAL_BASED_OUTPATIENT_CLINIC_OR_DEPARTMENT_OTHER): Payer: Self-pay | Admitting: *Deleted

## 2018-11-02 DIAGNOSIS — N736 Female pelvic peritoneal adhesions (postinfective): Secondary | ICD-10-CM | POA: Diagnosis not present

## 2018-11-02 DIAGNOSIS — N946 Dysmenorrhea, unspecified: Secondary | ICD-10-CM | POA: Insufficient documentation

## 2018-11-02 DIAGNOSIS — Z87891 Personal history of nicotine dependence: Secondary | ICD-10-CM | POA: Diagnosis not present

## 2018-11-02 DIAGNOSIS — N941 Unspecified dyspareunia: Secondary | ICD-10-CM | POA: Insufficient documentation

## 2018-11-02 DIAGNOSIS — Z6833 Body mass index (BMI) 33.0-33.9, adult: Secondary | ICD-10-CM | POA: Diagnosis not present

## 2018-11-02 DIAGNOSIS — R102 Pelvic and perineal pain: Secondary | ICD-10-CM | POA: Diagnosis not present

## 2018-11-02 HISTORY — DX: Major depressive disorder, single episode, unspecified: F32.9

## 2018-11-02 HISTORY — PX: LAPAROSCOPY: SHX197

## 2018-11-02 HISTORY — DX: Anxiety disorder, unspecified: F41.9

## 2018-11-02 HISTORY — DX: Depression, unspecified: F32.A

## 2018-11-02 HISTORY — PX: LAPAROSCOPIC LYSIS OF ADHESIONS: SHX5905

## 2018-11-02 SURGERY — LAPAROSCOPY, DIAGNOSTIC
Anesthesia: General | Site: Abdomen

## 2018-11-02 MED ORDER — BUPIVACAINE HCL (PF) 0.5 % IJ SOLN
INTRAMUSCULAR | Status: AC
  Filled 2018-11-02: qty 30

## 2018-11-02 MED ORDER — OXYCODONE-ACETAMINOPHEN 5-325 MG PO TABS: 1.0000 | ORAL_TABLET | ORAL | 0 refills | Status: DC | PRN

## 2018-11-02 MED ORDER — ONDANSETRON HCL 4 MG/2ML IJ SOLN
INTRAMUSCULAR | Status: AC
  Filled 2018-11-02: qty 2

## 2018-11-02 MED ORDER — MIDAZOLAM HCL 2 MG/2ML IJ SOLN
INTRAMUSCULAR | Status: AC
  Filled 2018-11-02: qty 2

## 2018-11-02 MED ORDER — ONDANSETRON HCL 4 MG/2ML IJ SOLN
INTRAMUSCULAR | Status: DC | PRN
  Administered 2018-11-02: 4 mg via INTRAVENOUS

## 2018-11-02 MED ORDER — SUGAMMADEX SODIUM 500 MG/5ML IV SOLN
INTRAVENOUS | Status: DC | PRN
  Administered 2018-11-02: 500 mg via INTRAVENOUS

## 2018-11-02 MED ORDER — SILVER NITRATE-POT NITRATE 75-25 % EX MISC
CUTANEOUS | Status: AC
  Filled 2018-11-02: qty 1

## 2018-11-02 MED ORDER — ACETAMINOPHEN 500 MG PO TABS: 1000.0000 mg | ORAL_TABLET | Freq: Once | ORAL | Status: DC | PRN

## 2018-11-02 MED ORDER — LIDOCAINE HCL (CARDIAC) PF 100 MG/5ML IV SOSY
PREFILLED_SYRINGE | INTRAVENOUS | Status: DC | PRN
  Administered 2018-11-02: 80 mg via INTRATRACHEAL

## 2018-11-02 MED ORDER — ACETAMINOPHEN 160 MG/5ML PO SOLN: 1000.0000 mg | Freq: Once | ORAL | Status: DC | PRN

## 2018-11-02 MED ORDER — FENTANYL CITRATE (PF) 100 MCG/2ML IJ SOLN
50.0000 ug | INTRAMUSCULAR | Status: DC | PRN
  Administered 2018-11-02: 100 ug via INTRAVENOUS

## 2018-11-02 MED ORDER — ACETAMINOPHEN 10 MG/ML IV SOLN: 1000.0000 mg | Freq: Once | INTRAVENOUS | Status: DC | PRN

## 2018-11-02 MED ORDER — FENTANYL CITRATE (PF) 100 MCG/2ML IJ SOLN: 25.0000 ug | INTRAMUSCULAR | Status: DC | PRN

## 2018-11-02 MED ORDER — LIDOCAINE 2% (20 MG/ML) 5 ML SYRINGE
INTRAMUSCULAR | Status: AC
  Filled 2018-11-02: qty 5

## 2018-11-02 MED ORDER — PROPOFOL 10 MG/ML IV BOLUS
INTRAVENOUS | Status: DC | PRN
  Administered 2018-11-02: 150 mg via INTRAVENOUS

## 2018-11-02 MED ORDER — ROCURONIUM BROMIDE 100 MG/10ML IV SOLN
INTRAVENOUS | Status: DC | PRN
  Administered 2018-11-02: 40 mg via INTRAVENOUS

## 2018-11-02 MED ORDER — PROPOFOL 500 MG/50ML IV EMUL
INTRAVENOUS | Status: DC | PRN
  Administered 2018-11-02: 25 ug/kg/min via INTRAVENOUS

## 2018-11-02 MED ORDER — OXYCODONE HCL 5 MG PO TABS: 5.0000 mg | ORAL_TABLET | Freq: Once | ORAL | Status: DC | PRN

## 2018-11-02 MED ORDER — SUCCINYLCHOLINE CHLORIDE 200 MG/10ML IV SOSY
PREFILLED_SYRINGE | INTRAVENOUS | Status: AC
  Filled 2018-11-02: qty 10

## 2018-11-02 MED ORDER — DEXAMETHASONE SODIUM PHOSPHATE 10 MG/ML IJ SOLN
INTRAMUSCULAR | Status: AC
  Filled 2018-11-02: qty 1

## 2018-11-02 MED ORDER — FENTANYL CITRATE (PF) 100 MCG/2ML IJ SOLN
INTRAMUSCULAR | Status: AC
  Filled 2018-11-02: qty 2

## 2018-11-02 MED ORDER — PROPOFOL 10 MG/ML IV BOLUS
INTRAVENOUS | Status: AC
  Filled 2018-11-02: qty 20

## 2018-11-02 MED ORDER — BUPIVACAINE HCL (PF) 0.5 % IJ SOLN
INTRAMUSCULAR | Status: DC | PRN
  Administered 2018-11-02: 20 mL

## 2018-11-02 MED ORDER — SUCCINYLCHOLINE CHLORIDE 20 MG/ML IJ SOLN
INTRAMUSCULAR | Status: DC | PRN
  Administered 2018-11-02: 80 mg via INTRAVENOUS

## 2018-11-02 MED ORDER — DEXAMETHASONE SODIUM PHOSPHATE 4 MG/ML IJ SOLN
INTRAMUSCULAR | Status: DC | PRN
  Administered 2018-11-02: 10 mg via INTRAVENOUS

## 2018-11-02 MED ORDER — OXYCODONE HCL 5 MG/5ML PO SOLN: 5.0000 mg | Freq: Once | ORAL | Status: DC | PRN

## 2018-11-02 MED ORDER — MIDAZOLAM HCL 2 MG/2ML IJ SOLN
1.0000 mg | INTRAMUSCULAR | Status: DC | PRN
  Administered 2018-11-02: 2 mg via INTRAVENOUS

## 2018-11-02 MED ORDER — KETOROLAC TROMETHAMINE 30 MG/ML IJ SOLN
INTRAMUSCULAR | Status: AC
  Filled 2018-11-02: qty 1

## 2018-11-02 MED ORDER — LACTATED RINGERS IV SOLN
INTRAVENOUS | Status: DC
  Administered 2018-11-02: 08:00:00 via INTRAVENOUS

## 2018-11-02 MED ORDER — KETOROLAC TROMETHAMINE 30 MG/ML IJ SOLN
INTRAMUSCULAR | Status: DC | PRN
  Administered 2018-11-02: 30 mg via INTRAVENOUS

## 2018-11-02 MED ORDER — SCOPOLAMINE 1 MG/3DAYS TD PT72
1.0000 | MEDICATED_PATCH | Freq: Once | TRANSDERMAL | Status: AC | PRN
  Administered 2018-11-02: 1 via TRANSDERMAL

## 2018-11-02 MED ORDER — IBUPROFEN 600 MG PO TABS: 600.0000 mg | ORAL_TABLET | Freq: Four times a day (QID) | ORAL | 1 refills | Status: DC | PRN

## 2018-11-02 SURGICAL SUPPLY — 37 items
BRIEF STRETCH FOR OB PAD XXL (UNDERPADS AND DIAPERS) ×3 IMPLANT
CABLE HIGH FREQUENCY MONO STRZ (ELECTRODE) IMPLANT
DERMABOND ADVANCED (GAUZE/BANDAGES/DRESSINGS)
DERMABOND ADVANCED .7 DNX12 (GAUZE/BANDAGES/DRESSINGS) IMPLANT
DRSG OPSITE POSTOP 3X4 (GAUZE/BANDAGES/DRESSINGS) IMPLANT
DURAPREP 26ML APPLICATOR (WOUND CARE) ×3 IMPLANT
ELECT REM PT RETURN 9FT ADLT (ELECTROSURGICAL)
ELECTRODE REM PT RTRN 9FT ADLT (ELECTROSURGICAL) IMPLANT
GLOVE BIO SURGEON STRL SZ 6.5 (GLOVE) ×2 IMPLANT
GLOVE BIO SURGEONS STRL SZ 6.5 (GLOVE) ×1
GLOVE BIOGEL PI IND STRL 7.0 (GLOVE) ×2 IMPLANT
GLOVE BIOGEL PI INDICATOR 7.0 (GLOVE) ×4
GOWN STRL REUS W/ TWL XL LVL3 (GOWN DISPOSABLE) ×1 IMPLANT
GOWN STRL REUS W/TWL LRG LVL3 (GOWN DISPOSABLE) ×3 IMPLANT
GOWN STRL REUS W/TWL XL LVL3 (GOWN DISPOSABLE) ×2
IV NS IRRIG 3000ML ARTHROMATIC (IV SOLUTION) IMPLANT
NEEDLE INSUFFLATION 120MM (ENDOMECHANICALS) ×3 IMPLANT
NS IRRIG 1000ML POUR BTL (IV SOLUTION) ×3 IMPLANT
PACK LAPAROSCOPY BASIN (CUSTOM PROCEDURE TRAY) ×3 IMPLANT
PACK TRENDGUARD 450 HYBRID PRO (MISCELLANEOUS) ×1 IMPLANT
PACK TRENDGUARD 600 HYBRD PROC (MISCELLANEOUS) IMPLANT
PAD OB MATERNITY 4.3X12.25 (PERSONAL CARE ITEMS) ×3 IMPLANT
PAD PREP 24X48 CUFFED NSTRL (MISCELLANEOUS) ×3 IMPLANT
POUCH SPECIMEN RETRIEVAL 10MM (ENDOMECHANICALS) IMPLANT
SET IRRIG TUBING LAPAROSCOPIC (IRRIGATION / IRRIGATOR) ×3 IMPLANT
SHEARS HARMONIC ACE PLUS 36CM (ENDOMECHANICALS) ×3 IMPLANT
SLEEVE SCD COMPRESS KNEE MED (MISCELLANEOUS) ×3 IMPLANT
SLEEVE XCEL OPT CAN 5 100 (ENDOMECHANICALS) ×6 IMPLANT
SUT VICRYL 0 UR6 27IN ABS (SUTURE) IMPLANT
SUT VICRYL 4-0 PS2 18IN ABS (SUTURE) ×3 IMPLANT
TOWEL GREEN STERILE FF (TOWEL DISPOSABLE) ×6 IMPLANT
TRENDGUARD 450 HYBRID PRO PACK (MISCELLANEOUS) ×3
TRENDGUARD 600 HYBRID PROC PK (MISCELLANEOUS)
TROCAR OPTI TIP 5M 100M (ENDOMECHANICALS) ×3 IMPLANT
TROCAR XCEL NON-BLD 11X100MML (ENDOMECHANICALS) IMPLANT
TUBING INSUFFLATION (TUBING) ×3 IMPLANT
WARMER LAPAROSCOPE (MISCELLANEOUS) ×3 IMPLANT

## 2018-11-02 NOTE — Op Note (Signed)
11/02/2018  9:29 AM  PATIENT:  Diana Fuentes  28 y.o. female  PRE-OPERATIVE DIAGNOSIS:  Pelvic Pain Dsymennorrhea Dyspareunia  POST-OPERATIVE DIAGNOSIS:  Same, adhesions  PROCEDURE:  Procedure(s): LAPAROSCOPY DIAGNOSTIC (N/A) LAPAROSCOPIC LYSIS OF ADHESIONS (N/A)  SURGEON:  Surgeon(s) and Role:    * Rajan Burgard, Wilhemina Cash, MD - Primary  ANESTHESIA:   local and general  EBL:  minimal   BLOOD ADMINISTERED:none  DRAINS: none   LOCAL MEDICATIONS USED:  MARCAINE     SPECIMEN:  No Specimen  DISPOSITION OF SPECIMEN:  N/A  COUNTS:  YES  TOURNIQUET:  * No tourniquets in log *  DICTATION: .Dragon Dictation  PLAN OF CARE: Discharge to home after PACU  PATIENT DISPOSITION:  PACU - hemodynamically stable.   Delay start of Pharmacological VTE agent (>24hrs) due to surgical blood loss or risk of bleeding: not applicable  The risks, benefits, and alternatives of surgery were explained, understood, accepted.  In the operating room she was placed in the dorsal lithotomy position, and general anesthesia was given without complication. Her abdomen and vagina were prepped and draped in the usual sterile fashion. A timeout procedure was done. A bimanual exam revealed a small anteverted and mobile uterus. Her adnexa felt normal. A Hulka manipulator was placed.  Gloves were changed, and attention was turned to the abdomen. Approximately the 10 mL of 0.5% Marcaine was injected into the umbilicus. A vertical incision was made at the site. A varies needle was placed intraperitoneally. Low-flow CO2 was used to insufflate the abdomen to approximately 3-1/2 L. Once a good pneumoperitoneum was established, a 5 mm Excel trocar was placed in the umbilical incision. Laparoscopy confirmed correct placement. A 5 mm port was placed in the left lower left quadrant under direct laparoscopic visualization after injecting 10 cc of  0.5% Marcaine in the incision site. Her upper abdomen appeared normal. There were some  filmy adhesions of the colon to the left side was and an omental adhesion to the left uterosacral ligament A Harmonic scapel was used to hemostatically release these adhesions, taking care to stay well away from the bowel. There was NO hydrosalpinx on either side. There were 2 small(each roughly 5 mm)  contiguous benign cysts next to the right oviduct. The fimbrae of both oviducts were less frond-like than normal and slightly agglutinated. The anterior and posterior cul de sacs were entirely normal as were the ovaries. I removed the 5 mm ports and noted hemostasis.  A subcuticular closure was done with 4-0 Vicryl suture at all incision sites. A Steri-Strip was placed across each incision. She was extubated and taken to the recovery room in stable condition.

## 2018-11-02 NOTE — Transfer of Care (Signed)
Immediate Anesthesia Transfer of Care Note  Patient: Diana Fuentes  Procedure(s) Performed: LAPAROSCOPY DIAGNOSTIC (N/A Abdomen) LAPAROSCOPIC LYSIS OF ADHESIONS (N/A Abdomen)  Patient Location: PACU  Anesthesia Type:General  Level of Consciousness: awake, alert  and oriented  Airway & Oxygen Therapy: Patient Spontanous Breathing and Patient connected to face mask oxygen  Post-op Assessment: Report given to RN and Post -op Vital signs reviewed and stable  Post vital signs: Reviewed and stable  Last Vitals:  Vitals Value Taken Time  BP 129/88 11/02/2018  9:31 AM  Temp    Pulse 87 11/02/2018  9:35 AM  Resp 20 11/02/2018  9:36 AM  SpO2 100 % 11/02/2018  9:35 AM  Vitals shown include unvalidated device data.  Last Pain:  Vitals:   11/02/18 0758  TempSrc: Oral         Complications: No apparent anesthesia complications

## 2018-11-02 NOTE — Anesthesia Procedure Notes (Signed)
Procedure Name: Intubation Performed by: Verita Lamb, CRNA Pre-anesthesia Checklist: Patient identified, Suction available, Emergency Drugs available, Patient being monitored and Timeout performed Patient Re-evaluated:Patient Re-evaluated prior to induction Oxygen Delivery Method: Circle system utilized Preoxygenation: Pre-oxygenation with 100% oxygen Induction Type: IV induction Ventilation: Mask ventilation without difficulty Laryngoscope Size: Mac and 3 Grade View: Grade I Tube type: Oral Tube size: 7.0 mm Number of attempts: 1 Airway Equipment and Method: Stylet Placement Confirmation: ETT inserted through vocal cords under direct vision,  positive ETCO2,  CO2 detector and breath sounds checked- equal and bilateral Secured at: 22 cm Tube secured with: Tape Dental Injury: Teeth and Oropharynx as per pre-operative assessment

## 2018-11-02 NOTE — Anesthesia Preprocedure Evaluation (Signed)
Anesthesia Evaluation  Patient identified by MRN, date of birth, ID band Patient awake    Reviewed: Allergy & Precautions, NPO status , Patient's Chart, lab work & pertinent test results  History of Anesthesia Complications Negative for: history of anesthetic complications  Airway Mallampati: II  TM Distance: >3 FB Neck ROM: Full    Dental  (+) Teeth Intact   Pulmonary former smoker,    breath sounds clear to auscultation       Cardiovascular negative cardio ROS   Rhythm:Regular     Neuro/Psych PSYCHIATRIC DISORDERS Anxiety Depression negative neurological ROS     GI/Hepatic negative GI ROS, Neg liver ROS,   Endo/Other  Morbid obesity  Renal/GU negative Renal ROS     Musculoskeletal   Abdominal   Peds  Hematology   Anesthesia Other Findings   Reproductive/Obstetrics                             Anesthesia Physical Anesthesia Plan  ASA: II  Anesthesia Plan: General   Post-op Pain Management:    Induction: Intravenous  PONV Risk Score and Plan: 3 and Ondansetron, Dexamethasone, Propofol infusion and Scopolamine patch - Pre-op  Airway Management Planned: Oral ETT  Additional Equipment: None  Intra-op Plan:   Post-operative Plan: Extubation in OR  Informed Consent: I have reviewed the patients History and Physical, chart, labs and discussed the procedure including the risks, benefits and alternatives for the proposed anesthesia with the patient or authorized representative who has indicated his/her understanding and acceptance.     Dental advisory given  Plan Discussed with: CRNA and Surgeon  Anesthesia Plan Comments:         Anesthesia Quick Evaluation

## 2018-11-02 NOTE — H&P (Signed)
.  Diana Fuentes is a 28 y.o. married P1 (2yo son) here today for followup for pelvic pain, dysmenorrhea, and some dyspareunia for about a year. She can trigger the pain by pushing in on her lower pelvis. Her u/s showed a 1.4 cm right possible hydrosalpinx. She had a h/o GC at about age 45. She is wanting a pregnancy so she does not want OCPs. She would like the hydrosalpinx removed if it looks to be present. She understands that she would still have one tube/oviduct for pregnancy. She also understands that by removing the tube, I would be decreasing her risk of a tubal pregnancy on that side.   Patient's last menstrual period was 10/14/2018 (exact date).    Past Medical History:  Diagnosis Date  . Adrenal adenoma   . Anxiety   . Chlamydia   . Depression   . Ear infection     Past Surgical History:  Procedure Laterality Date  . WISDOM TOOTH EXTRACTION      Family History  Problem Relation Age of Onset  . Diabetes Maternal Aunt   . Diabetes Maternal Uncle   . Diabetes Maternal Grandmother     Social History:  reports that she has quit smoking. She uses smokeless tobacco. She reports that she does not drink alcohol or use drugs.  Allergies:  Allergies  Allergen Reactions  . Shellfish Allergy Rash and Shortness Of Breath    Medications Prior to Admission  Medication Sig Dispense Refill Last Dose  . ibuprofen (ADVIL,MOTRIN) 600 MG tablet Take 1 tablet (600 mg total) by mouth every 6 (six) hours as needed. 30 tablet 0 More than a month at Unknown time    ROS  Blood pressure 126/80, pulse 73, temperature 98 F (36.7 C), temperature source Oral, resp. rate 16, height 5\' 6"  (1.676 m), weight 94.2 kg, last menstrual period 10/14/2018, SpO2 100 %. Physical Exam Breathing, conversing, and ambulating normally Well nourished, well hydrated Latina, no apparent distress  Heart- rrr Lungs- CTAB Abd- benign  Results for orders placed or performed during the hospital encounter of  11/01/18 (from the past 24 hour(s))  Pregnancy, urine POC     Status: None   Collection Time: 11/01/18  2:40 PM  Result Value Ref Range   Preg Test, Ur NEGATIVE NEGATIVE    No results found.  Assessment/Plan: Chronic pelvic pain- plan for diagnostic laparoscopy.  She understands the risks of surgery, including, but not to infection, bleeding, DVTs, damage to bowel, bladder, ureters. She wishes to proceed. She also understands that I may not find the cause of her pain.  Silvina Hackleman C Ashden Sonnenberg 11/02/2018, 8:09 AM

## 2018-11-02 NOTE — Discharge Instructions (Addendum)
Diagnostic Laparoscopy, Care After °This sheet gives you information about how to care for yourself after your procedure. Your health care provider may also give you more specific instructions. If you have problems or questions, contact your health care provider. °What can I expect after the procedure? °After the procedure, it is common to have: °· Mild discomfort in the abdomen. °· Sore throat. °Women who have laparoscopy with pelvic examination may have mild cramping and fluid coming from the vagina for a few days after the procedure. °Follow these instructions at home: °Medicines °· Take over-the-counter and prescription medicines only as told by your health care provider. °· If you were prescribed an antibiotic medicine, take it as told by your health care provider. Do not stop taking the antibiotic even if you start to feel better. °Driving °· Do not drive for 24 hours if you were given a medicine to help you relax (sedative) during your procedure. °· Do not drive or use heavy machinery while taking prescription pain medicine. °Bathing °· Do not take baths, swim, or use a hot tub until your health care provider approves. You may take showers. °Incision care ° °· Follow instructions from your health care provider about how to take care of your incisions. Make sure you: °? Wash your hands with soap and water before you change your bandage (dressing). If soap and water are not available, use hand sanitizer. °? Change your dressing as told by your health care provider. °? Leave stitches (sutures), skin glue, or adhesive strips in place. These skin closures may need to stay in place for 2 weeks or longer. If adhesive strip edges start to loosen and curl up, you may trim the loose edges. Do not remove adhesive strips completely unless your health care provider tells you to do that. °· Check your incision areas every day for signs of infection. Check for: °? Redness, swelling, or pain. °? Fluid or  blood. °? Warmth. °? Pus or a bad smell. °Activity °· Return to your normal activities as told by your health care provider. Ask your health care provider what activities are safe for you. °· Do not lift anything that is heavier than 10 lb (4.5 kg), or the limit that you are told, until your health care provider says that it is safe. °General instructions °· To prevent or treat constipation while you are taking prescription pain medicine, your health care provider may recommend that you: °? Drink enough fluid to keep your urine pale yellow. °? Take over-the-counter or prescription medicines. °? Eat foods that are high in fiber, such as fresh fruits and vegetables, whole grains, and beans. °? Limit foods that are high in fat and processed sugars, such as fried and sweet foods. °· Do not use any products that contain nicotine or tobacco, such as cigarettes and e-cigarettes. If you need help quitting, ask your health care provider. °· Keep all follow-up visits as told by your health care provider. This is important. °Contact a health care provider if: °· You develop shoulder pain. °· You feel lightheaded or faint. °· You are unable to pass gas or have a bowel movement. °· You feel nauseous or you vomit. °· You develop a rash. °· You have redness, swelling, or pain around any incision. °· You have fluid or blood coming from any incision. °· Any incision feels warm to the touch. °· You have pus or a bad smell coming from any incision. °· You have a fever or chills. °Get help   right away if:  You have severe pain.  You have vomiting that does not go away.  You have heavy bleeding from the vagina.  Any incision opens.  You have trouble breathing.  You have chest pain. Summary  After the procedure, it is common to have mild discomfort in the abdomen and a sore throat.  Check your incision areas every day for signs of infection.  Return to your normal activities as told by your health care provider. Ask  your health care provider what activities are safe for you. This information is not intended to replace advice given to you by your health care provider. Make sure you discuss any questions you have with your health care provider. Document Released: 08/12/2015 Document Revised: 02/24/2017 Document Reviewed: 02/24/2017 Elsevier Interactive Patient Education  2019 [facility] Instructions  Activity: Get plenty of rest for the remainder of the day. A responsible individual must stay with you for 24 hours following the procedure.  For the next 24 hours, DO NOT: -Drive a car -Paediatric nurse -Drink alcoholic beverages -Take any medication unless instructed by your physician -Make any legal decisions or sign important papers.  Meals: Start with liquid foods such as gelatin or soup. Progress to regular foods as tolerated. Avoid greasy, spicy, heavy foods. If nausea and/or vomiting occur, drink only clear liquids until the nausea and/or vomiting subsides. Call your physician if vomiting continues.  Special Instructions/Symptoms: Your throat may feel dry or sore from the anesthesia or the breathing tube placed in your throat during surgery. If this causes discomfort, gargle with warm salt water. The discomfort should disappear within 24 hours.  If you had a scopolamine patch placed behind your ear for the management of post- operative nausea and/or vomiting:  1. The medication in the patch is effective for 72 hours, after which it should be removed.  Wrap patch in a tissue and discard in the trash. Wash hands thoroughly with soap and water. 2. You may remove the patch earlier than 72 hours if you experience unpleasant side effects which may include dry mouth, dizziness or visual disturbances. 3. Avoid touching the patch. Wash your hands with soap and water after contact with the patch.  DISCHARGE INSTRUCTIONS: Laparoscopy  The following instructions have been  prepared to help you care for yourself upon your return home today.  Wound care:  Do not get the incision wet for the first 24 hours. The incision should be kept clean and dry.  The Band-Aids or dressings may be removed the day after surgery.  Should the incision become sore, red, and swollen after the first week, check with your doctor.  Personal hygiene:  Shower the day after your procedure.  Activity and limitations:  Do NOT drive or operate any equipment today.  Do NOT lift anything more than 15 pounds for 2-3 weeks after surgery.  Do NOT rest in bed all day.  Walking is encouraged. Walk each day, starting slowly with 5-minute walks 3 or 4 times a day. Slowly increase the length of your walks.  Walk up and down stairs slowly.  Do NOT do strenuous activities, such as golfing, playing tennis, bowling, running, biking, weight lifting, gardening, mowing, or vacuuming for 2-4 weeks. Ask your doctor when it is okay to start.  Diet: Eat a light meal as desired this evening. You may resume your usual diet tomorrow.  Return to work: This is dependent on the type of work you do. For the  most part you can return to a desk job within a week of surgery. If you are more active at work, please discuss this with your doctor.  What to expect after your surgery: You may have a slight burning sensation when you urinate on the first day. You may have a very small amount of blood in the urine. Expect to have a small amount of vaginal discharge/light bleeding for 1-2 weeks. It is not unusual to have abdominal soreness and bruising for up to 2 weeks. You may be tired and need more rest for about 1 week. You may experience shoulder pain for 24-72 hours. Lying flat in bed may relieve it.  Call your doctor for any of the following:  Develop a fever of 100.4 or greater  Inability to urinate 6 hours after discharge from hospital  Severe pain not relieved by pain medications  Persistent of heavy  bleeding at incision site  Redness or swelling around incision site after a week  Increasing nausea or vomiting  Patient Signature________________________________________ Nurse Signature_________________________________________    No Ibuprofen until 4:25pm

## 2018-11-03 ENCOUNTER — Encounter (HOSPITAL_BASED_OUTPATIENT_CLINIC_OR_DEPARTMENT_OTHER): Payer: Self-pay | Admitting: Obstetrics & Gynecology

## 2018-11-03 NOTE — Anesthesia Postprocedure Evaluation (Signed)
Anesthesia Post Note  Patient: Diana Fuentes  Procedure(s) Performed: LAPAROSCOPY DIAGNOSTIC (N/A Abdomen) LAPAROSCOPIC LYSIS OF ADHESIONS (N/A Abdomen)     Patient location during evaluation: PACU Anesthesia Type: General Level of consciousness: awake and alert Pain management: pain level controlled Vital Signs Assessment: post-procedure vital signs reviewed and stable Respiratory status: spontaneous breathing, nonlabored ventilation, respiratory function stable and patient connected to nasal cannula oxygen Cardiovascular status: blood pressure returned to baseline and stable Postop Assessment: no apparent nausea or vomiting Anesthetic complications: no    Last Vitals:  Vitals:   11/02/18 1000 11/02/18 1037  BP: 125/76 (!) 141/73  Pulse: 80 66  Resp: (!) 21 20  Temp:  36.6 C  SpO2: 98% 100%    Last Pain:  Vitals:   11/02/18 1037  TempSrc:   PainSc: 0-No pain                 Savio Albrecht

## 2019-01-12 ENCOUNTER — Other Ambulatory Visit: Payer: Self-pay

## 2019-01-12 ENCOUNTER — Ambulatory Visit (INDEPENDENT_AMBULATORY_CARE_PROVIDER_SITE_OTHER): Payer: Self-pay | Admitting: General Practice

## 2019-01-12 ENCOUNTER — Ambulatory Visit: Payer: Medicaid Other

## 2019-01-12 ENCOUNTER — Other Ambulatory Visit (HOSPITAL_COMMUNITY)
Admission: RE | Admit: 2019-01-12 | Discharge: 2019-01-12 | Disposition: A | Discharge status: Home / Self Care | Payer: Medicaid Other | Source: Ambulatory Visit | Attending: Family Medicine | Admitting: Family Medicine

## 2019-01-12 DIAGNOSIS — Z113 Encounter for screening for infections with a predominantly sexual mode of transmission: Secondary | ICD-10-CM

## 2019-01-12 NOTE — Progress Notes (Signed)
Chart reviewed - agree with RN documentation.   

## 2019-01-12 NOTE — Progress Notes (Signed)
Patient presents to office today requesting STD screening. Denies discharge/pelvic pain. Patient instructed in self swab and specimen collected. Discussed results take 24-48 hours and will be released to mychart. Patient verbalized understanding & had no questions.   Koren Bound RN BSN 01/12/19

## 2019-01-13 LAB — CERVICOVAGINAL ANCILLARY ONLY
Chlamydia: NEGATIVE
Neisseria Gonorrhea: NEGATIVE
Trichomonas: NEGATIVE

## 2019-05-20 ENCOUNTER — Other Ambulatory Visit: Payer: Self-pay

## 2019-05-20 ENCOUNTER — Emergency Department (HOSPITAL_BASED_OUTPATIENT_CLINIC_OR_DEPARTMENT_OTHER)
Admission: EM | Admit: 2019-05-20 | Discharge: 2019-05-20 | Disposition: A | Discharge status: Home / Self Care | Payer: Medicaid Other | Attending: Emergency Medicine | Admitting: Emergency Medicine

## 2019-05-20 ENCOUNTER — Emergency Department (HOSPITAL_BASED_OUTPATIENT_CLINIC_OR_DEPARTMENT_OTHER): Payer: Medicaid Other

## 2019-05-20 DIAGNOSIS — Z87891 Personal history of nicotine dependence: Secondary | ICD-10-CM | POA: Diagnosis not present

## 2019-05-20 DIAGNOSIS — K2900 Acute gastritis without bleeding: Secondary | ICD-10-CM | POA: Insufficient documentation

## 2019-05-20 DIAGNOSIS — R1011 Right upper quadrant pain: Secondary | ICD-10-CM

## 2019-05-20 DIAGNOSIS — R1013 Epigastric pain: Secondary | ICD-10-CM | POA: Diagnosis present

## 2019-05-20 LAB — URINALYSIS, ROUTINE W REFLEX MICROSCOPIC
Bilirubin Urine: NEGATIVE
Glucose, UA: NEGATIVE mg/dL
Ketones, ur: 15 mg/dL — AB
Leukocytes,Ua: NEGATIVE
Nitrite: NEGATIVE
Protein, ur: NEGATIVE mg/dL
Specific Gravity, Urine: 1.02 (ref 1.005–1.030)
pH: 8 (ref 5.0–8.0)

## 2019-05-20 LAB — COMPREHENSIVE METABOLIC PANEL
ALT: 17 U/L (ref 0–44)
AST: 16 U/L (ref 15–41)
Albumin: 3.9 g/dL (ref 3.5–5.0)
Alkaline Phosphatase: 46 U/L (ref 38–126)
Anion gap: 11 (ref 5–15)
BUN: 16 mg/dL (ref 6–20)
CO2: 23 mmol/L (ref 22–32)
Calcium: 8.4 mg/dL — ABNORMAL LOW (ref 8.9–10.3)
Chloride: 107 mmol/L (ref 98–111)
Creatinine, Ser: 0.78 mg/dL (ref 0.44–1.00)
GFR calc Af Amer: >60 mL/min (ref >60)
GFR calc non Af Amer: >60 mL/min (ref >60)
Glucose, Bld: 134 mg/dL — ABNORMAL HIGH (ref 70–99)
Potassium: 3.7 mmol/L (ref 3.5–5.1)
Sodium: 141 mmol/L (ref 135–145)
Total Bilirubin: 0.5 mg/dL (ref 0.3–1.2)
Total Protein: 6.5 g/dL (ref 6.5–8.1)

## 2019-05-20 LAB — CBC WITH DIFFERENTIAL/PLATELET
Abs Immature Granulocytes: 0.04 10*3/uL (ref 0.00–0.07)
Basophils Absolute: 0 10*3/uL (ref 0.0–0.1)
Basophils Relative: 0 %
Eosinophils Absolute: 0 10*3/uL (ref 0.0–0.5)
Eosinophils Relative: 0 %
HCT: 41 % (ref 36.0–46.0)
Hemoglobin: 13.2 g/dL (ref 12.0–15.0)
Immature Granulocytes: 0 %
Lymphocytes Relative: 10 %
Lymphs Abs: 1.5 10*3/uL (ref 0.7–4.0)
MCH: 29.8 pg (ref 26.0–34.0)
MCHC: 32.2 g/dL (ref 30.0–36.0)
MCV: 92.6 fL (ref 80.0–100.0)
Monocytes Absolute: 0.4 10*3/uL (ref 0.1–1.0)
Monocytes Relative: 3 %
Neutro Abs: 12.7 10*3/uL — ABNORMAL HIGH (ref 1.7–7.7)
Neutrophils Relative %: 87 %
Platelets: 402 10*3/uL — ABNORMAL HIGH (ref 150–400)
RBC: 4.43 MIL/uL (ref 3.87–5.11)
RDW: 12.5 % (ref 11.5–15.5)
WBC: 14.7 10*3/uL — ABNORMAL HIGH (ref 4.0–10.5)
nRBC: 0 % (ref 0.0–0.2)

## 2019-05-20 LAB — URINALYSIS, MICROSCOPIC (REFLEX): WBC, UA: NONE SEEN WBC/hpf (ref 0–5)

## 2019-05-20 LAB — PREGNANCY, URINE: Preg Test, Ur: NEGATIVE

## 2019-05-20 LAB — LIPASE, BLOOD: Lipase: 20 U/L (ref 11–51)

## 2019-05-20 MED ORDER — SUCRALFATE 1 G PO TABS: 1.0000 g | ORAL_TABLET | Freq: Three times a day (TID) | ORAL | 0 refills | Status: DC

## 2019-05-20 MED ORDER — ALUM & MAG HYDROXIDE-SIMETH 200-200-20 MG/5ML PO SUSP
30.0000 mL | Freq: Once | ORAL | Status: AC
  Administered 2019-05-20: 13:00:00 30 mL via ORAL
  Filled 2019-05-20: qty 30

## 2019-05-20 MED ORDER — MORPHINE SULFATE (PF) 4 MG/ML IV SOLN
4.0000 mg | Freq: Once | INTRAVENOUS | Status: AC
  Administered 2019-05-20: 11:00:00 4 mg via INTRAVENOUS
  Filled 2019-05-20: qty 1

## 2019-05-20 MED ORDER — PANTOPRAZOLE SODIUM 20 MG PO TBEC: 20.0000 mg | DELAYED_RELEASE_TABLET | Freq: Every day | ORAL | 0 refills | Status: DC

## 2019-05-20 MED ORDER — SODIUM CHLORIDE 0.9 % IV BOLUS
1000.0000 mL | Freq: Once | INTRAVENOUS | Status: AC
  Administered 2019-05-20: 11:00:00 1000 mL via INTRAVENOUS

## 2019-05-20 MED ORDER — ONDANSETRON 4 MG PO TBDP: ORAL_TABLET | ORAL | 0 refills | Status: DC

## 2019-05-20 MED ORDER — ONDANSETRON HCL 4 MG/2ML IJ SOLN
4.0000 mg | Freq: Once | INTRAMUSCULAR | Status: AC
  Administered 2019-05-20: 11:00:00 4 mg via INTRAVENOUS
  Filled 2019-05-20: qty 2

## 2019-05-20 MED ORDER — LIDOCAINE VISCOUS HCL 2 % MT SOLN
15.0000 mL | Freq: Once | OROMUCOSAL | Status: AC
  Administered 2019-05-20: 13:00:00 15 mL via ORAL
  Filled 2019-05-20: qty 15

## 2019-05-20 NOTE — ED Triage Notes (Signed)
Pt here wit N/V and epigastric discomfort x 2days

## 2019-05-20 NOTE — ED Provider Notes (Signed)
[facility]Y DEPARTMENT Provider Note   CSN: [id number] Arrival date & time: 05/20/19  [id number]     History   Chief Complaint Chief Complaint  Patient presents with  . Emesis  . Nausea    HPI Diana Fuentes is a 28 y.o. female.     HPI Patient presents with 2 days of epigastric pain associated with nausea.  States she is induced vomiting several times.  Questions whether she may have noticed some small amount of red blood in the vomit.  Her abdominal pain is constant and she states she has "been afraid to eat".  No radiation of the pain.  No fever or chills.  No diarrhea or constipation.  No melanotic stools.  Denies NSAID use.     Past Medical History:  Diagnosis Date  . Adrenal adenoma   . Anxiety   . Chlamydia   . Depression   . Ear infection     Patient Active Problem List   Diagnosis Date Noted  . Pelvic pain 07/26/2018    Past Surgical History:  Procedure Laterality Date  . LAPAROSCOPIC LYSIS OF ADHESIONS N/A 11/02/2018   Procedure: LAPAROSCOPIC LYSIS OF ADHESIONS;  Surgeon: Emily Filbert, MD;  Location: [facility];  Service: Gynecology;  Laterality: N/A;  . LAPAROSCOPY N/A 11/02/2018   Procedure: LAPAROSCOPY DIAGNOSTIC;  Surgeon: Emily Filbert, MD;  Location: [facility];  Service: Gynecology;  Laterality: N/A;  . WISDOM TOOTH EXTRACTION       OB History    Gravida  1   Para  1   Term  1   Preterm      AB      Living  1     SAB      TAB      Ectopic      Multiple      Live Births  1            Home Medications    Prior to Admission medications   Medication Sig Start Date End Date Taking? Authorizing Provider  ibuprofen (ADVIL,MOTRIN) 600 MG tablet Take 1 tablet (600 mg total) by mouth every 6 (six) hours as needed. 11/02/18   Emily Filbert, MD  ondansetron (ZOFRAN ODT) 4 MG disintegrating tablet 4mg  ODT q4 hours prn nausea/vomit 05/20/19   Julianne Rice, MD  oxyCODONE-acetaminophen  (PERCOCET/ROXICET) 5-325 MG tablet Take 1 tablet by mouth every 4 (four) hours as needed. 11/02/18   Emily Filbert, MD  pantoprazole (PROTONIX) 20 MG tablet Take 1 tablet (20 mg total) by mouth daily. 05/20/19   Julianne Rice, MD  sucralfate (CARAFATE) 1 g tablet Take 1 tablet (1 g total) by mouth 4 (four) times daily -  with meals and at bedtime. 05/20/19   Julianne Rice, MD    Family History Family History  Problem Relation Age of Onset  . Diabetes Maternal Aunt   . Diabetes Maternal Uncle   . Diabetes Maternal Grandmother     Social History Social History   Tobacco Use  . Smoking status: Former Research scientist (life sciences)  . Smokeless tobacco: Current User  . Tobacco comment: vape  Substance Use Topics  . Alcohol use: No  . Drug use: No     Allergies   Shellfish allergy   Review of Systems Review of Systems  Constitutional: Negative for chills.  HENT: Negative for sore throat and trouble swallowing.   Eyes: Negative for visual disturbance.  Respiratory: Negative for cough and shortness  of breath.   Cardiovascular: Negative for chest pain.  Gastrointestinal: Positive for abdominal pain, nausea and vomiting. Negative for blood in stool, constipation and diarrhea.  Genitourinary: Negative for dysuria, flank pain and frequency.  Musculoskeletal: Negative for back pain, myalgias and neck pain.  Skin: Negative for rash and wound.  Neurological: Negative for dizziness, weakness, light-headedness, numbness and headaches.  All other systems reviewed and are negative.    Physical Exam Updated Vital Signs BP 117/72 (BP Location: Left Arm)   Pulse (!) 50   Temp 98.1 F (36.7 C) (Oral)   Resp 16   Ht 5\' 5"  (1.651 m)   Wt 94.8 kg   LMP 05/17/2019 (Exact Date)   SpO2 100%   BMI 34.78 kg/m   Physical Exam Vitals signs and nursing note reviewed.  Constitutional:      General: She is not in acute distress.    Appearance: Normal appearance. She is well-developed. She is not ill-appearing.   HENT:     Head: Normocephalic and atraumatic.     Nose: Nose normal.     Mouth/Throat:     Mouth: Mucous membranes are moist.  Eyes:     Pupils: Pupils are equal, round, and reactive to light.  Neck:     Musculoskeletal: Normal range of motion and neck supple. No neck rigidity or muscular tenderness.     Vascular: No carotid bruit.  Cardiovascular:     Rate and Rhythm: Normal rate and regular rhythm.     Heart sounds: No murmur. No friction rub. No gallop.   Pulmonary:     Effort: Pulmonary effort is normal. No respiratory distress.     Breath sounds: Normal breath sounds. No stridor. No wheezing, rhonchi or rales.  Chest:     Chest wall: No tenderness.  Abdominal:     General: Bowel sounds are normal.     Palpations: Abdomen is soft.     Tenderness: There is abdominal tenderness. There is no guarding or rebound.     Comments: Epigastric tenderness to palpation.  No rebound or guarding.  Musculoskeletal: Normal range of motion.        General: No swelling, tenderness, deformity or signs of injury.     Right lower leg: No edema.     Left lower leg: No edema.     Comments: No midline thoracic or lumbar tenderness.  No CVA tenderness.  Lymphadenopathy:     Cervical: No cervical adenopathy.  Skin:    General: Skin is warm and dry.     Findings: No erythema or rash.  Neurological:     General: No focal deficit present.     Mental Status: She is alert and oriented to person, place, and time.  Psychiatric:        Behavior: Behavior normal.      ED Treatments / Results  Labs (all labs ordered are listed, but only abnormal results are displayed) Labs Reviewed  CBC WITH DIFFERENTIAL/PLATELET - Abnormal; Notable for the following components:      Result Value   WBC 14.7 (*)    Platelets 402 (*)    Neutro Abs 12.7 (*)    All other components within normal limits  COMPREHENSIVE METABOLIC PANEL - Abnormal; Notable for the following components:   Glucose, Bld 134 (*)     Calcium 8.4 (*)    All other components within normal limits  URINALYSIS, ROUTINE W REFLEX MICROSCOPIC - Abnormal; Notable for the following components:   Color, Urine STRAW (*)  Hgb urine dipstick LARGE (*)    Ketones, ur 15 (*)    All other components within normal limits  URINALYSIS, MICROSCOPIC (REFLEX) - Abnormal; Notable for the following components:   Bacteria, UA FEW (*)    All other components within normal limits  LIPASE, BLOOD  PREGNANCY, URINE    EKG None  Radiology US Abdomen Limited Ruq  Result Date: 05/20/2019 CLINICAL DATA:  Nausea and vomiting for 2 days EXAM: ULTRASOUND ABDOMEN LIMITED RIGHT UPPER QUADRANT COMPARISON:  None. FINDINGS: Gallbladder: No gallstones or wall thickening visualized. No sonographic Murphy sign noted by sonographer. Common bile duct: Diameter: 3 mm Liver: No focal lesion identified. The liver echogenicity is diffusely increased. Portal vein is patent on color Doppler imaging with normal direction of blood flow towards the liver. Other: None. IMPRESSION: 1. No acute finding to explain patient's symptoms of nausea and vomiting. 2. Diffusely increased liver echogenicity as can be seen in hepatic steatosis. Electronically Signed   By: Audie Pinto M.D.   On: 05/20/2019 11:57    Procedures Procedures (including critical care time)  Medications Ordered in ED Medications  morphine 4 MG/ML injection 4 mg (4 mg Intravenous Given 05/20/19 1111)  ondansetron (ZOFRAN) injection 4 mg (4 mg Intravenous Given 05/20/19 1111)  sodium chloride 0.9 % bolus 1,000 mL (0 mLs Intravenous Stopped 05/20/19 1247)  alum & mag hydroxide-simeth (MAALOX/MYLANTA) 200-200-20 MG/5ML suspension 30 mL (30 mLs Oral Given 05/20/19 1252)    And  lidocaine (XYLOCAINE) 2 % viscous mouth solution 15 mL (15 mLs Oral Given 05/20/19 1252)     Initial Impression / Assessment and Plan / ED Course  I have reviewed the triage vital signs and the nursing notes.  Pertinent labs & imaging  results that were available during my care of the patient were reviewed by me and considered in my medical decision making (see chart for details).       Patient states she is feeling much better after the GI cocktail.  Ultrasound of the right upper quadrant without acute findings.  Normal lipase.  Patient likely has gastritis.  Will start on PPI and Carafate.  Will give Zofran as needed for nausea.  Advised to avoid NSAIDs.  Given dietary instructions for GERD/gastritis.  Patient understands the need to follow-up with gastroenterology should her symptoms persist.  Return precautions given.   Final Clinical Impressions(s) / ED Diagnoses   Final diagnoses:  Acute gastritis, presence of bleeding unspecified, unspecified gastritis type    ED Discharge Orders         Ordered    pantoprazole (PROTONIX) 20 MG tablet  Daily     05/20/19 1402    ondansetron (ZOFRAN ODT) 4 MG disintegrating tablet     05/20/19 1402    sucralfate (CARAFATE) 1 g tablet  3 times daily with meals & bedtime     05/20/19 1402           Julianne Rice, MD 05/20/19 [phone]

## 2019-05-20 NOTE — Discharge Instructions (Signed)
Avoid NSAIDs including ibuprofen and naproxen and spicy food.  Take medication as prescribed.  If your symptoms persist you will need to follow-up with a gastroenterologist.

## 2019-07-17 ENCOUNTER — Ambulatory Visit (INDEPENDENT_AMBULATORY_CARE_PROVIDER_SITE_OTHER): Payer: Self-pay | Admitting: Emergency Medicine

## 2019-07-17 ENCOUNTER — Other Ambulatory Visit: Payer: Self-pay

## 2019-07-17 ENCOUNTER — Other Ambulatory Visit (HOSPITAL_COMMUNITY)
Admission: RE | Admit: 2019-07-17 | Discharge: 2019-07-17 | Disposition: A | Discharge status: Home / Self Care | Payer: Medicaid Other | Source: Ambulatory Visit | Attending: Obstetrics and Gynecology | Admitting: Obstetrics and Gynecology

## 2019-07-17 DIAGNOSIS — Z113 Encounter for screening for infections with a predominantly sexual mode of transmission: Secondary | ICD-10-CM | POA: Insufficient documentation

## 2019-07-17 NOTE — Progress Notes (Signed)
Agree with A & P. 

## 2019-07-17 NOTE — Progress Notes (Signed)
Pt here today for STD screening. Pt states she wants to be screened for STDS because she was last tested in April and it was only for gonorrhea and chlamydia. Pt also reports that 2 weeks ago she had 4 white-colored bumps around her vagina that were painful to touch and went away after 6 days. Pt denies vaginal discharge or itching. Pt is concerned she may have HSV. Pt also reports having pain with intercourse and would like to speak with a provider regarding her concerns. Pt agreed to do vaginal self swab today and schedule an appointment to see a provider and have an exam done to address her other concerns. Pt prefers to see provider who performed her surgery and she was informed that it was Dr. Hulan Fray. Pt was instructed on how to perform self swab and verbalized understanding. Pt was informed that results could take at least 48 hours and she would receive a call for abnormal results. Pt had no further questions or concerns.

## 2019-07-18 LAB — CERVICOVAGINAL ANCILLARY ONLY
Bacterial Vaginitis (gardnerella): POSITIVE — AB
Candida Glabrata: NEGATIVE
Candida Vaginitis: POSITIVE — AB
Chlamydia: NEGATIVE
Comment: NEGATIVE
Comment: NEGATIVE
Comment: NEGATIVE
Comment: NEGATIVE
Comment: NEGATIVE
Comment: NORMAL
Neisseria Gonorrhea: NEGATIVE
Trichomonas: NEGATIVE

## 2019-07-19 ENCOUNTER — Telehealth: Payer: Self-pay

## 2019-07-19 ENCOUNTER — Other Ambulatory Visit: Payer: Self-pay

## 2019-07-19 ENCOUNTER — Telehealth: Payer: Self-pay | Admitting: Emergency Medicine

## 2019-07-19 DIAGNOSIS — B9689 Other specified bacterial agents as the cause of diseases classified elsewhere: Secondary | ICD-10-CM

## 2019-07-19 DIAGNOSIS — B379 Candidiasis, unspecified: Secondary | ICD-10-CM

## 2019-07-19 MED ORDER — METRONIDAZOLE 500 MG PO TABS: 500.0000 mg | ORAL_TABLET | Freq: Two times a day (BID) | ORAL | 0 refills | Status: DC

## 2019-07-19 MED ORDER — FLUCONAZOLE 150 MG PO TABS: 150.0000 mg | ORAL_TABLET | Freq: Once | ORAL | 0 refills | Status: AC

## 2019-07-19 NOTE — Telephone Encounter (Signed)
Called pt to give test results, no answr, left VM for call back. Pt is also on My Chart will send message also.

## 2019-07-19 NOTE — Telephone Encounter (Signed)
-----   Message from Chancy Milroy, MD sent at 07/19/2019  9:44 AM EST ----- Please sent in  Flagyl 500 mg po bid x 7 days Diflucan 150 mg po now and repeat in 3 days Pt is aware  Thanks Legrand Como

## 2019-07-19 NOTE — Telephone Encounter (Signed)
Pt called and left a message on the nurse voicemail line stating that she was calling to see what pharmacy her prescription was sent to and pt also stated on another message she was returning a call regarding test results.   Attempted to call pt back to provide clarification. Pt unavailable. LVM informing pt of attempt and encouraged pt to call if she needed any further assistance.

## 2019-07-31 ENCOUNTER — Ambulatory Visit: Payer: Medicaid Other | Admitting: Obstetrics & Gynecology

## 2019-09-14 ENCOUNTER — Ambulatory Visit (INDEPENDENT_AMBULATORY_CARE_PROVIDER_SITE_OTHER): Payer: Medicaid Other | Admitting: Obstetrics & Gynecology

## 2019-09-14 ENCOUNTER — Other Ambulatory Visit: Payer: Self-pay

## 2019-09-14 DIAGNOSIS — N941 Unspecified dyspareunia: Secondary | ICD-10-CM

## 2019-09-14 MED ORDER — NORGESTREL-ETHINYL ESTRADIOL 0.3-30 MG-MCG PO TABS: 1.0000 | ORAL_TABLET | Freq: Every day | ORAL | 11 refills | Status: DC

## 2019-09-14 NOTE — Progress Notes (Signed)
Patient ID: Diana Fuentes, female   DOB: August 17, 1991, 28 y.o.   MRN: [id number]  Chief Complaint  Patient presents with  . Gynecologic Exam    HPI Diana Fuentes is a 28 y.o. married (separated)  P1 (9yo son) here today to discuss painful intercourse. The pain started with sex since surgery. She is having sex with her boyfriend. It is not positional, feels like it is a "certain spot", "like where she has cramps with my period". She reports that the pain is not with insertion, but with deep penetration. It does not happen every time, just randomly. She has not tried any meds. She has not been using contraception but now has decided that she wants contraception. She has used the IUD but only had it for a few months but she felt this same pain. She used OCPs in about 2013-2014. She did not have pain with this method. She had cervical cultures 11/20, negative. She has not had an u/s.   I did a laparoscopy in 2019 for pelvic pain and only saw adhesion, no endometriosis.  Past Medical History:  Diagnosis Date  . Adrenal adenoma   . Anxiety   . Chlamydia   . Depression   . Ear infection     Past Surgical History:  Procedure Laterality Date  . LAPAROSCOPIC LYSIS OF ADHESIONS N/A 11/02/2018   Procedure: LAPAROSCOPIC LYSIS OF ADHESIONS;  Surgeon: Emily Filbert, MD;  Location: [place];  Service: Gynecology;  Laterality: N/A;  . LAPAROSCOPY N/A 11/02/2018   Procedure: LAPAROSCOPY DIAGNOSTIC;  Surgeon: Emily Filbert, MD;  Location: [place];  Service: Gynecology;  Laterality: N/A;  . WISDOM TOOTH EXTRACTION      Family History  Problem Relation Age of Onset  . Diabetes Maternal Aunt   . Diabetes Maternal Uncle   . Diabetes Maternal Grandmother     Social History Social History   Tobacco Use  . Smoking status: Former Research scientist (life sciences)  . Smokeless tobacco: Current User  . Tobacco comment: vape  Substance Use Topics  . Alcohol use: No  . Drug use: No    Allergies   Allergen Reactions  . Shellfish Allergy Rash and Shortness Of Breath    No current outpatient medications on file.   No current facility-administered medications for this visit.    Review of Systems Review of Systems Pap normal 12/19 She had Gardasil as a youngster.  Last menstrual period 08/28/2019.  Physical Exam Physical Exam Breathing, conversing, and ambulating normally Well nourished, well hydrated White female, no apparent distress Abd- benign  Data Reviewed Labs and u/s  Assessment Contraception dyspareunia  Plan Start OCPs/lo ovral- start with NMP, back up method now and for a month Schedule gyn u/s  She declines a flu vaccine  F/U with a gynecologist    Emily Filbert 09/14/2019, 11:21 AM

## 2019-10-18 ENCOUNTER — Ambulatory Visit: Payer: Medicaid Other | Admitting: Obstetrics & Gynecology

## 2019-10-23 ENCOUNTER — Ambulatory Visit (HOSPITAL_COMMUNITY): Admission: RE | Admit: 2019-10-23 | Payer: 59 | Source: Ambulatory Visit

## 2019-11-09 ENCOUNTER — Ambulatory Visit (HOSPITAL_COMMUNITY): Admission: RE | Admit: 2019-11-09 | Payer: 59 | Source: Ambulatory Visit

## 2019-11-09 ENCOUNTER — Ambulatory Visit: Payer: Medicaid Other | Admitting: Family Medicine

## 2019-11-16 ENCOUNTER — Ambulatory Visit: Payer: 59 | Admitting: Obstetrics and Gynecology

## 2019-11-16 ENCOUNTER — Encounter: Payer: Self-pay | Admitting: Obstetrics and Gynecology

## 2019-11-19 NOTE — Progress Notes (Signed)
Patient did not keep her GYN appointment for 11/16/2019.  Durene Romans MD Attending Center for [employer] farm manager)

## 2019-12-12 ENCOUNTER — Encounter (HOSPITAL_COMMUNITY): Payer: Self-pay

## 2019-12-12 ENCOUNTER — Encounter (HOSPITAL_BASED_OUTPATIENT_CLINIC_OR_DEPARTMENT_OTHER): Payer: Self-pay | Admitting: Emergency Medicine

## 2019-12-12 ENCOUNTER — Emergency Department (HOSPITAL_COMMUNITY)
Admission: EM | Admit: 2019-12-12 | Discharge: 2019-12-12 | Disposition: A | Discharge status: Home / Self Care | Payer: 59 | Source: Home / Self Care | Attending: Emergency Medicine | Admitting: Emergency Medicine

## 2019-12-12 ENCOUNTER — Emergency Department (HOSPITAL_BASED_OUTPATIENT_CLINIC_OR_DEPARTMENT_OTHER)
Admission: EM | Admit: 2019-12-12 | Discharge: 2019-12-12 | Disposition: A | Discharge status: Home / Self Care | Payer: 59 | Attending: Emergency Medicine | Admitting: Emergency Medicine

## 2019-12-12 ENCOUNTER — Other Ambulatory Visit: Payer: Self-pay

## 2019-12-12 DIAGNOSIS — Z79899 Other long term (current) drug therapy: Secondary | ICD-10-CM | POA: Diagnosis not present

## 2019-12-12 DIAGNOSIS — R112 Nausea with vomiting, unspecified: Secondary | ICD-10-CM

## 2019-12-12 DIAGNOSIS — F17290 Nicotine dependence, other tobacco product, uncomplicated: Secondary | ICD-10-CM | POA: Diagnosis not present

## 2019-12-12 DIAGNOSIS — E860 Dehydration: Secondary | ICD-10-CM | POA: Diagnosis not present

## 2019-12-12 LAB — CBC WITH DIFFERENTIAL/PLATELET
Abs Immature Granulocytes: 0.11 10*3/uL — ABNORMAL HIGH (ref 0.00–0.07)
Abs Immature Granulocytes: 0.09 10*3/uL — ABNORMAL HIGH (ref 0.00–0.07)
Basophils Absolute: 0 10*3/uL (ref 0.0–0.1)
Basophils Absolute: 0 10*3/uL (ref 0.0–0.1)
Basophils Relative: 0 %
Basophils Relative: 0 %
Eosinophils Absolute: 0 10*3/uL (ref 0.0–0.5)
Eosinophils Absolute: 0 10*3/uL (ref 0.0–0.5)
Eosinophils Relative: 0 %
Eosinophils Relative: 0 %
HCT: 40.1 % (ref 36.0–46.0)
HCT: 38.7 % (ref 36.0–46.0)
Hemoglobin: 13.1 g/dL (ref 12.0–15.0)
Hemoglobin: 12.6 g/dL (ref 12.0–15.0)
Immature Granulocytes: 1 %
Immature Granulocytes: 1 %
Lymphocytes Relative: 7 %
Lymphocytes Relative: 11 %
Lymphs Abs: 1.2 10*3/uL (ref 0.7–4.0)
Lymphs Abs: 1.9 10*3/uL (ref 0.7–4.0)
MCH: 30 pg (ref 26.0–34.0)
MCH: 30.2 pg (ref 26.0–34.0)
MCHC: 32.7 g/dL (ref 30.0–36.0)
MCHC: 32.6 g/dL (ref 30.0–36.0)
MCV: 92 fL (ref 80.0–100.0)
MCV: 92.8 fL (ref 80.0–100.0)
Monocytes Absolute: 0.6 10*3/uL (ref 0.1–1.0)
Monocytes Absolute: 1.2 10*3/uL — ABNORMAL HIGH (ref 0.1–1.0)
Monocytes Relative: 4 %
Monocytes Relative: 7 %
Neutro Abs: 15.7 10*3/uL — ABNORMAL HIGH (ref 1.7–7.7)
Neutro Abs: 14.6 10*3/uL — ABNORMAL HIGH (ref 1.7–7.7)
Neutrophils Relative %: 88 %
Neutrophils Relative %: 81 %
Platelets: 449 10*3/uL — ABNORMAL HIGH (ref 150–400)
Platelets: 399 10*3/uL (ref 150–400)
RBC: 4.36 MIL/uL (ref 3.87–5.11)
RBC: 4.17 MIL/uL (ref 3.87–5.11)
RDW: 12.4 % (ref 11.5–15.5)
RDW: 12.4 % (ref 11.5–15.5)
WBC: 17.6 10*3/uL — ABNORMAL HIGH (ref 4.0–10.5)
WBC: 17.8 10*3/uL — ABNORMAL HIGH (ref 4.0–10.5)
nRBC: 0 % (ref 0.0–0.2)
nRBC: 0 % (ref 0.0–0.2)

## 2019-12-12 LAB — URINALYSIS, MICROSCOPIC (REFLEX)

## 2019-12-12 LAB — COMPREHENSIVE METABOLIC PANEL
ALT: 20 U/L (ref 0–44)
ALT: 20 U/L (ref 0–44)
AST: 16 U/L (ref 15–41)
AST: 17 U/L (ref 15–41)
Albumin: 4.6 g/dL (ref 3.5–5.0)
Albumin: 4.5 g/dL (ref 3.5–5.0)
Alkaline Phosphatase: 63 U/L (ref 38–126)
Alkaline Phosphatase: 57 U/L (ref 38–126)
Anion gap: 14 (ref 5–15)
Anion gap: 11 (ref 5–15)
BUN: 15 mg/dL (ref 6–20)
BUN: 13 mg/dL (ref 6–20)
CO2: 27 mmol/L (ref 22–32)
CO2: 28 mmol/L (ref 22–32)
Calcium: 9.6 mg/dL (ref 8.9–10.3)
Calcium: 9.3 mg/dL (ref 8.9–10.3)
Chloride: 100 mmol/L (ref 98–111)
Chloride: 102 mmol/L (ref 98–111)
Creatinine, Ser: 0.73 mg/dL (ref 0.44–1.00)
Creatinine, Ser: 0.77 mg/dL (ref 0.44–1.00)
GFR calc Af Amer: >60 mL/min (ref >60)
GFR calc Af Amer: >60 mL/min (ref >60)
GFR calc non Af Amer: >60 mL/min (ref >60)
GFR calc non Af Amer: >60 mL/min (ref >60)
Glucose, Bld: 132 mg/dL — ABNORMAL HIGH (ref 70–99)
Glucose, Bld: 115 mg/dL — ABNORMAL HIGH (ref 70–99)
Potassium: 3.6 mmol/L (ref 3.5–5.1)
Potassium: 3.6 mmol/L (ref 3.5–5.1)
Sodium: 141 mmol/L (ref 135–145)
Sodium: 141 mmol/L (ref 135–145)
Total Bilirubin: 1.1 mg/dL (ref 0.3–1.2)
Total Bilirubin: 0.9 mg/dL (ref 0.3–1.2)
Total Protein: 7.9 g/dL (ref 6.5–8.1)
Total Protein: 7.7 g/dL (ref 6.5–8.1)

## 2019-12-12 LAB — URINALYSIS, ROUTINE W REFLEX MICROSCOPIC
Bilirubin Urine: NEGATIVE
Glucose, UA: NEGATIVE mg/dL
Ketones, ur: 15 mg/dL — AB
Leukocytes,Ua: NEGATIVE
Nitrite: NEGATIVE
Protein, ur: NEGATIVE mg/dL
Specific Gravity, Urine: >1.03 — ABNORMAL HIGH (ref 1.005–1.030)
pH: 6 (ref 5.0–8.0)

## 2019-12-12 LAB — PREGNANCY, URINE: Preg Test, Ur: NEGATIVE

## 2019-12-12 MED ORDER — PROMETHAZINE HCL 25 MG RE SUPP: 25.0000 mg | Freq: Four times a day (QID) | RECTAL | 0 refills | Status: DC | PRN

## 2019-12-12 MED ORDER — ONDANSETRON 4 MG PO TBDP: 4.0000 mg | ORAL_TABLET | Freq: Three times a day (TID) | ORAL | 0 refills | Status: DC | PRN

## 2019-12-12 MED ORDER — ONDANSETRON HCL 4 MG/2ML IJ SOLN
4.0000 mg | Freq: Once | INTRAMUSCULAR | Status: AC
  Administered 2019-12-12: 07:00:00 4 mg via INTRAVENOUS
  Filled 2019-12-12: qty 2

## 2019-12-12 MED ORDER — SODIUM CHLORIDE 0.9 % IV BOLUS
2000.0000 mL | Freq: Once | INTRAVENOUS | Status: AC
  Administered 2019-12-12: 1000 mL via INTRAVENOUS

## 2019-12-12 MED ORDER — SODIUM CHLORIDE 0.9 % IV BOLUS
1000.0000 mL | Freq: Once | INTRAVENOUS | Status: AC
  Administered 2019-12-12: 1000 mL via INTRAVENOUS

## 2019-12-12 MED ORDER — ONDANSETRON HCL 4 MG/2ML IJ SOLN
4.0000 mg | Freq: Once | INTRAMUSCULAR | Status: AC
  Administered 2019-12-12: 4 mg via INTRAVENOUS
  Filled 2019-12-12: qty 2

## 2019-12-12 MED ORDER — PROMETHAZINE HCL 25 MG/ML IJ SOLN
25.0000 mg | Freq: Once | INTRAMUSCULAR | Status: AC
  Administered 2019-12-12: 25 mg via INTRAVENOUS
  Filled 2019-12-12: qty 1

## 2019-12-12 MED ORDER — LORAZEPAM 2 MG/ML IJ SOLN
1.0000 mg | Freq: Once | INTRAMUSCULAR | Status: AC
  Administered 2019-12-12: 1 mg via INTRAVENOUS
  Filled 2019-12-12: qty 1

## 2019-12-12 NOTE — ED Provider Notes (Signed)
[place] DEPT [facility] Provider Note: Georgena Spurling, MD, FACEP  CSN: [id number] MRN: [id number] ARRIVAL: 12/12/19 at [place]: [place]  12/12/19 6:29 AM Diana Fuentes is a 29 y.o. female who has had nausea and vomiting for about 24 hours.  The vomiting has been persistent, occurring about every 30 minutes.  There is no associated abdominal pain but she does feel some abdominal discomfort before she vomits.  She has had no fever or diarrhea.  She has had some chills associated with vomiting.  She was lightheaded yesterday but not presently.  Her urine is somewhat darker than usual.  She has not been able to keep any fluids down since this began.  She denies vaginal bleeding, vaginal discharge or dysuria.   Past Medical History:  Diagnosis Date  . Adrenal adenoma   . Anxiety   . Chlamydia   . Depression   . Ear infection     Past Surgical History:  Procedure Laterality Date  . LAPAROSCOPIC LYSIS OF ADHESIONS N/A 11/02/2018   Procedure: LAPAROSCOPIC LYSIS OF ADHESIONS;  Surgeon: Emily Filbert, MD;  Location: [place];  Service: Gynecology;  Laterality: N/A;  . LAPAROSCOPY N/A 11/02/2018   Procedure: LAPAROSCOPY DIAGNOSTIC;  Surgeon: Emily Filbert, MD;  Location: [place];  Service: Gynecology;  Laterality: N/A;  . WISDOM TOOTH EXTRACTION      Family History  Problem Relation Age of Onset  . Diabetes Maternal Aunt   . Diabetes Maternal Uncle   . Diabetes Maternal Grandmother     Social History   Tobacco Use  . Smoking status: Former Research scientist (life sciences)  . Smokeless tobacco: Current User  . Tobacco comment: vape  Substance Use Topics  . Alcohol use: Yes    Comment: occ  . Drug use: No    Prior to Admission medications   Medication Sig Start Date End Date Taking? Authorizing Provider  norgestrel-ethinyl estradiol (LO/OVRAL) 0.3-30 MG-MCG tablet Take 1 tablet by mouth daily. 09/14/19    Emily Filbert, MD    Allergies Shellfish allergy   REVIEW OF SYSTEMS  Negative except as noted here or in the History of Present Illness.   PHYSICAL EXAMINATION  Initial Vital Signs Blood pressure 123/67, pulse (!) 55, temperature 98.3 F (36.8 C), temperature source Oral, resp. rate 16, height 5\' 6"  (1.676 m), weight 93 kg, last menstrual period 11/24/2019, SpO2 99 %.  Examination General: Well-developed, well-nourished female in no acute distress; appearance consistent with age of record HENT: normocephalic; atraumatic Eyes: pupils equal, round and reactive to light; extraocular muscles intact Neck: supple Heart: regular rate and rhythm Lungs: clear to auscultation bilaterally Abdomen: soft; nondistended; minimal epigastric tenderness; no masses or hepatosplenomegaly; bowel sounds present Extremities: No deformity; full range of motion; pulses normal Neurologic: Awake, alert and oriented; motor function intact in all extremities and symmetric; no facial droop Skin: Warm and dry Psychiatric: Normal mood and affect   RESULTS  Summary of this visit's results, reviewed and interpreted by myself:   EKG Interpretation  Date/Time:    Ventricular Rate:    PR Interval:    QRS Duration:   QT Interval:    QTC Calculation:   R Axis:     Text Interpretation:        Laboratory Studies: No results found for this or any previous visit (from the past 24 hour(s)). Imaging Studies: No results found.  ED COURSE and MDM  Nursing notes, initial and subsequent vitals signs, including pulse oximetry, reviewed and interpreted by myself.  Vitals:   12/12/19 0617 12/12/19 0620  BP:  123/67  Pulse:  (!) 55  Resp:  16  Temp:  98.3 F (36.8 C)  TempSrc:  Oral  SpO2:  99%  Weight: 93 kg   Height: 5\' 6"  (1.676 m)    Medications  sodium chloride 0.9 % bolus 2,000 mL (has no administration in time range)  ondansetron (ZOFRAN) injection 4 mg (has no administration in time range)    6:53 AM IV fluid bolus initiated.  Labs pending.  Signed out to Dr. Ardith Dark.   PROCEDURES  Procedures   ED DIAGNOSES     ICD-10-CM   1. Nausea and vomiting in adult  R11.2   2. Dehydration  E86.0        Bridgid Printz, Jenny Reichmann, MD 12/12/19 [phone]

## 2019-12-12 NOTE — ED Triage Notes (Signed)
Patient reports that she has been vomiting since yesterday. Patient reports that she went to [facility] and was given IV fluids and zofran. Patient states she continues to feel nauseated and decided to come to this ED.

## 2019-12-12 NOTE — ED Notes (Signed)
Pt provided with water and crackers.

## 2019-12-12 NOTE — Discharge Instructions (Signed)
If you develop worsening, continued, or recurrent abdominal pain, uncontrolled vomiting, fever, chest or back pain, or any other new/concerning symptoms then return to the ER for evaluation.  

## 2019-12-12 NOTE — ED Provider Notes (Signed)
Patient is feeling much better after Zofran and fluids.  No abdominal pain at this time.  I reexamined her and there is no reproducible tenderness.  While she does have the elevated WBC at 17, I think intra-abdominal emergency is unlikely based on exam and presentation.  Will discharge with Zofran after her fluids are finished. Return precautions discussed.   Sherwood Gambler, MD 12/12/19 [phone]

## 2019-12-12 NOTE — ED Triage Notes (Signed)
Pt states Monday morning she woke up feeling nauseated then started vomiting  Pt states she has not been able to hold anything down since  Pt states when she vomits she gets chills  States she feels weak and yesterday she was pale  Pt states now when she vomits it is just green bile

## 2019-12-12 NOTE — Discharge Instructions (Addendum)
You have been seen today for nausea and vomiting. Please read and follow all provided instructions. Return to the emergency room for worsening condition or new concerning symptoms.    1. Medications:  Prescription sent to your pharmacy for Phenergan suppositories.  Please use these as needed for nausea. Take as prescribed  Continue usual home medications Take medications as prescribed. Please review all of the medicines and only take them if you do not have an allergy to them.   2. Treatment: rest, drink plenty of fluids  3. Follow Up:  Please follow up with primary care provider by scheduling an appointment as soon as possible for a visit  If you do not have a primary care physician, contact [facility] at [phone] for referral   It is also a possibility that you have an allergic reaction to any of the medicines that you have been prescribed - Everybody reacts differently to medications and while MOST people have no trouble with most medicines, you may have a reaction such as nausea, vomiting, rash, swelling, shortness of breath. If this is the case, please stop taking the medicine immediately and contact your physician.  ?

## 2019-12-12 NOTE — ED Provider Notes (Signed)
[place] DEPT Provider Note   CSN: [id number] Arrival date & time: 12/12/19  1317     History Chief Complaint  Patient presents with  . Emesis    Diana Fuentes is a 29 y.o. female with past medical history significant for adrenal adenoma, depression, anxiety presents to emergency department today with chief complaint of emesis x 2 days.  She denies any associated abdominal pain however does state her abdomen feels sore while vomiting.  She has had too numerous episodes of nonbloody nonbilious emesis in the last 24 hours to count.  She is unable to tolerate any p.o. intake. Patient admits to smoking marijuana daily however she last smoked x 3 days ago.  Patient states she was seen in the emergency department earlier today for similar complaint.  She was treated with IV fluids, Zofran and discharged home with prescription for the same.  Patient states despite taking the Zofran she has continued to have emesis and came to the emergency department for further evaluation.  She denies any fever, chills, chest pain, urinary symptoms, diarrhea, vaginal bleeding, vaginal discharge, back pain.  She denies any sick contacts or recent travel.  LMP was 11/24/2019.   History provided by patient with additional history obtained from chart review.     Past Medical History:  Diagnosis Date  . Adrenal adenoma   . Anxiety   . Chlamydia   . Depression   . Ear infection     Patient Active Problem List   Diagnosis Date Noted  . Pelvic pain 07/26/2018    Past Surgical History:  Procedure Laterality Date  . LAPAROSCOPIC LYSIS OF ADHESIONS N/A 11/02/2018   Procedure: LAPAROSCOPIC LYSIS OF ADHESIONS;  Surgeon: Emily Filbert, MD;  Location: [place];  Service: Gynecology;  Laterality: N/A;  . LAPAROSCOPY N/A 11/02/2018   Procedure: LAPAROSCOPY DIAGNOSTIC;  Surgeon: Emily Filbert, MD;  Location: [place];  Service: Gynecology;  Laterality:  N/A;  . WISDOM TOOTH EXTRACTION       OB History    Gravida  1   Para  1   Term  1   Preterm      AB      Living  1     SAB      TAB      Ectopic      Multiple      Live Births  1           Family History  Problem Relation Age of Onset  . Diabetes Maternal Aunt   . Diabetes Maternal Uncle   . Diabetes Maternal Grandmother     Social History   Tobacco Use  . Smoking status: Former Research scientist (life sciences)  . Smokeless tobacco: Current User  . Tobacco comment: vape  Substance Use Topics  . Alcohol use: Yes    Comment: occ  . Drug use: No    Home Medications Prior to Admission medications   Medication Sig Start Date End Date Taking? Authorizing Provider  norgestrel-ethinyl estradiol (LO/OVRAL) 0.3-30 MG-MCG tablet Take 1 tablet by mouth daily. 09/14/19   Emily Filbert, MD  ondansetron (ZOFRAN ODT) 4 MG disintegrating tablet Take 1 tablet (4 mg total) by mouth every 8 (eight) hours as needed. 12/12/19   Sherwood Gambler, MD  promethazine (PHENERGAN) 25 MG suppository Place 1 suppository (25 mg total) rectally every 6 (six) hours as needed for nausea or vomiting. 12/12/19   Albrizze, Harley Hallmark, PA-C    Allergies  Shellfish allergy  Review of Systems   Review of Systems  All other systems are reviewed and are negative for acute change except as noted in the HPI.   Physical Exam Updated Vital Signs BP 126/86 (BP Location: Left Arm)   Pulse (!) 56   Temp 99.3 F (37.4 C) (Oral)   Resp 18   LMP 11/24/2019 (Exact Date)   SpO2 100%   Physical Exam Vitals and nursing note reviewed.  Constitutional:      General: She is not in acute distress.    Appearance: She is not ill-appearing.  HENT:     Head: Normocephalic and atraumatic.     Right Ear: Tympanic membrane and external ear normal.     Left Ear: Tympanic membrane and external ear normal.     Nose: Nose normal.     Mouth/Throat:     Mouth: Mucous membranes are dry.     Pharynx: Oropharynx is clear.  Eyes:      General: No scleral icterus.       Right eye: No discharge.        Left eye: No discharge.     Extraocular Movements: Extraocular movements intact.     Conjunctiva/sclera: Conjunctivae normal.     Pupils: Pupils are equal, round, and reactive to light.  Neck:     Vascular: No JVD.  Cardiovascular:     Rate and Rhythm: Normal rate and regular rhythm.     Pulses: Normal pulses.          Radial pulses are 2+ on the right side and 2+ on the left side.     Heart sounds: Normal heart sounds.  Pulmonary:     Comments: Lungs clear to auscultation in all fields. Symmetric chest rise. No wheezing, rales, or rhonchi. Abdominal:     Comments: Abdomen is soft, non-distended, and non-tender in all quadrants. No rigidity, no guarding. No peritoneal signs.  Musculoskeletal:        General: Normal range of motion.     Cervical back: Normal range of motion.  Skin:    General: Skin is warm and dry.     Capillary Refill: Capillary refill takes less than 2 seconds.  Neurological:     Mental Status: She is oriented to person, place, and time.     GCS: GCS eye subscore is 4. GCS verbal subscore is 5. GCS motor subscore is 6.     Comments: Fluent speech, no facial droop.  Psychiatric:        Behavior: Behavior normal.       ED Results / Procedures / Treatments   Labs (all labs ordered are listed, but only abnormal results are displayed) Labs Reviewed  COMPREHENSIVE METABOLIC PANEL - Abnormal; Notable for the following components:      Result Value   Glucose, Bld 115 (*)    All other components within normal limits  CBC WITH DIFFERENTIAL/PLATELET - Abnormal; Notable for the following components:   WBC 17.8 (*)    Neutro Abs 14.6 (*)    Monocytes Absolute 1.2 (*)    Abs Immature Granulocytes 0.09 (*)    All other components within normal limits    EKG None  Radiology No results found.  Procedures Procedures (including critical care time)  Medications Ordered in ED Medications   promethazine (PHENERGAN) injection 25 mg (25 mg Intravenous Given 12/12/19 1435)  sodium chloride 0.9 % bolus 1,000 mL (0 mLs Intravenous Stopped 12/12/19 1638)  LORazepam (ATIVAN) injection 1 mg (  1 mg Intravenous Given 12/12/19 1637)    ED Course  I have reviewed the triage vital signs and the nursing notes.  Pertinent labs & imaging results that were available during my care of the patient were reviewed by me and considered in my medical decision making (see chart for details).    MDM Rules/Calculators/A&P                      Patient seen and examined. Patient presents awake, alert, hemodynamically stable, afebrile, non toxic.  On exam patient appears slightly dehydrated, mucous membranes are dry.  She has no abdominal tenderness, no peritoneal signs. I viewed labs patient had collected at ED appointment earlier this morning.  Given her persistent emesis will obtain lab work to rule out electrolyte abnormalities.  Patient given IV fluids and Phenergan. Labs show leukocytosis of 17.8 is consistent with this morning when it was 17.6.  She is afebrile and has no abdominal tenderness, still feel that this is likely to be infectious or surgical abdomen causing her symptoms.  She has no severe electrolyte derangement, no renal insufficiency.  Normal anion gap.  On reassessment she is resting comfortably.  Serial abdominal exams have been benign.  She is tolerating p.o. intake.  Patient received prescription for Zofran without symptom improvement, will discharge with prescription for Phenergan suppositories for nausea.  The patient appears reasonably screened and/or stabilized for discharge and I doubt any other medical condition or other [facility] requiring further screening, evaluation, or treatment in the ED at this time prior to discharge. The patient is safe for discharge with strict return precautions discussed. Recommend pcp follow up if symptoms persist.  Portions of this note were generated with  Dragon dictation software. Dictation errors may occur despite best attempts at proofreading.   Final Clinical Impression(s) / ED Diagnoses Final diagnoses:  Non-intractable vomiting with nausea, unspecified vomiting type    Rx / DC Orders ED Discharge Orders         Ordered    promethazine (PHENERGAN) 25 MG suppository  Every 6 hours PRN     12/12/19 1742           Cherre Robins, PA-C 12/12/19 1753    Lennice Sites, DO 12/13/19 [phone]

## 2019-12-14 ENCOUNTER — Other Ambulatory Visit: Payer: Self-pay

## 2019-12-14 ENCOUNTER — Encounter (HOSPITAL_COMMUNITY): Payer: Self-pay | Admitting: Emergency Medicine

## 2019-12-14 ENCOUNTER — Emergency Department (HOSPITAL_COMMUNITY)
Admission: EM | Admit: 2019-12-14 | Discharge: 2019-12-14 | Disposition: A | Discharge status: Home / Self Care | Payer: 59 | Attending: Emergency Medicine | Admitting: Emergency Medicine

## 2019-12-14 ENCOUNTER — Emergency Department (HOSPITAL_COMMUNITY): Payer: 59

## 2019-12-14 DIAGNOSIS — Z793 Long term (current) use of hormonal contraceptives: Secondary | ICD-10-CM | POA: Diagnosis not present

## 2019-12-14 DIAGNOSIS — Z87891 Personal history of nicotine dependence: Secondary | ICD-10-CM | POA: Diagnosis not present

## 2019-12-14 DIAGNOSIS — R112 Nausea with vomiting, unspecified: Secondary | ICD-10-CM | POA: Diagnosis present

## 2019-12-14 DIAGNOSIS — R109 Unspecified abdominal pain: Secondary | ICD-10-CM | POA: Insufficient documentation

## 2019-12-14 DIAGNOSIS — R748 Abnormal levels of other serum enzymes: Secondary | ICD-10-CM

## 2019-12-14 LAB — CBC WITH DIFFERENTIAL/PLATELET
Abs Immature Granulocytes: 0.07 10*3/uL (ref 0.00–0.07)
Basophils Absolute: 0 10*3/uL (ref 0.0–0.1)
Basophils Relative: 0 %
Eosinophils Absolute: 0 10*3/uL (ref 0.0–0.5)
Eosinophils Relative: 0 %
HCT: 39.7 % (ref 36.0–46.0)
Hemoglobin: 12.9 g/dL (ref 12.0–15.0)
Immature Granulocytes: 1 %
Lymphocytes Relative: 11 %
Lymphs Abs: 1.4 10*3/uL (ref 0.7–4.0)
MCH: 29.9 pg (ref 26.0–34.0)
MCHC: 32.5 g/dL (ref 30.0–36.0)
MCV: 92.1 fL (ref 80.0–100.0)
Monocytes Absolute: 0.4 10*3/uL (ref 0.1–1.0)
Monocytes Relative: 3 %
Neutro Abs: 10.6 10*3/uL — ABNORMAL HIGH (ref 1.7–7.7)
Neutrophils Relative %: 85 %
Platelets: 383 10*3/uL (ref 150–400)
RBC: 4.31 MIL/uL (ref 3.87–5.11)
RDW: 12.1 % (ref 11.5–15.5)
WBC: 12.5 10*3/uL — ABNORMAL HIGH (ref 4.0–10.5)
nRBC: 0 % (ref 0.0–0.2)

## 2019-12-14 LAB — COMPREHENSIVE METABOLIC PANEL
ALT: 46 U/L — ABNORMAL HIGH (ref 0–44)
AST: 30 U/L (ref 15–41)
Albumin: 4.3 g/dL (ref 3.5–5.0)
Alkaline Phosphatase: 53 U/L (ref 38–126)
Anion gap: 13 (ref 5–15)
BUN: 13 mg/dL (ref 6–20)
CO2: 20 mmol/L — ABNORMAL LOW (ref 22–32)
Calcium: 8.3 mg/dL — ABNORMAL LOW (ref 8.9–10.3)
Chloride: 103 mmol/L (ref 98–111)
Creatinine, Ser: 0.72 mg/dL (ref 0.44–1.00)
GFR calc Af Amer: >60 mL/min (ref >60)
GFR calc non Af Amer: >60 mL/min (ref >60)
Glucose, Bld: 123 mg/dL — ABNORMAL HIGH (ref 70–99)
Potassium: 3.3 mmol/L — ABNORMAL LOW (ref 3.5–5.1)
Sodium: 136 mmol/L (ref 135–145)
Total Bilirubin: 1.1 mg/dL (ref 0.3–1.2)
Total Protein: 7.1 g/dL (ref 6.5–8.1)

## 2019-12-14 LAB — LIPASE, BLOOD: Lipase: 71 U/L — ABNORMAL HIGH (ref 11–51)

## 2019-12-14 MED ORDER — ONDANSETRON 8 MG PO TBDP: 8.0000 mg | ORAL_TABLET | Freq: Three times a day (TID) | ORAL | 0 refills | Status: DC | PRN

## 2019-12-14 MED ORDER — PANTOPRAZOLE SODIUM 40 MG PO TBEC: 40.0000 mg | DELAYED_RELEASE_TABLET | Freq: Every day | ORAL | 0 refills | Status: DC

## 2019-12-14 MED ORDER — IOHEXOL 300 MG/ML  SOLN
100.0000 mL | Freq: Once | INTRAMUSCULAR | Status: AC | PRN
  Administered 2019-12-14: 100 mL via INTRAVENOUS

## 2019-12-14 MED ORDER — SODIUM CHLORIDE (PF) 0.9 % IJ SOLN
INTRAMUSCULAR | Status: AC
  Filled 2019-12-14: qty 50

## 2019-12-14 MED ORDER — PROMETHAZINE HCL 25 MG/ML IJ SOLN
12.5000 mg | Freq: Once | INTRAMUSCULAR | Status: AC
  Administered 2019-12-14: 14:00:00 12.5 mg via INTRAVENOUS
  Filled 2019-12-14: qty 1

## 2019-12-14 MED ORDER — SODIUM CHLORIDE 0.9 % IV BOLUS
1000.0000 mL | Freq: Once | INTRAVENOUS | Status: AC
  Administered 2019-12-14: 1000 mL via INTRAVENOUS

## 2019-12-14 MED ORDER — ONDANSETRON HCL 4 MG/2ML IJ SOLN
4.0000 mg | Freq: Once | INTRAMUSCULAR | Status: AC
  Administered 2019-12-14: 12:00:00 4 mg via INTRAVENOUS
  Filled 2019-12-14: qty 2

## 2019-12-14 MED ORDER — ONDANSETRON HCL 4 MG/2ML IJ SOLN
4.0000 mg | Freq: Once | INTRAMUSCULAR | Status: AC
  Administered 2019-12-14: 4 mg via INTRAVENOUS
  Filled 2019-12-14: qty 2

## 2019-12-14 MED ORDER — PANTOPRAZOLE SODIUM 40 MG IV SOLR
40.0000 mg | Freq: Once | INTRAVENOUS | Status: AC
  Administered 2019-12-14: 10:00:00 40 mg via INTRAVENOUS
  Filled 2019-12-14: qty 40

## 2019-12-14 NOTE — ED Triage Notes (Signed)
Per pt, has been seen twice this week for same symptoms, last time was 3/30-states she cant keep anything down although she is making herself vomit due to "feeling like what's in her stomach wont come up"-states she only used suppository once to help with symptoms

## 2019-12-14 NOTE — ED Notes (Signed)
Pt ambulatory to RR independently  

## 2019-12-14 NOTE — Discharge Instructions (Addendum)
Avoid alcohol and marijuana.  Clear liquids today.  A chemical associated with your pancreas called lipase is elevated.  We also discussed the other issues on your CT scan.  Prescription for nausea and stomach meds.

## 2019-12-14 NOTE — ED Notes (Signed)
Signature pad in room not working, pt verbalized understanding

## 2019-12-14 NOTE — ED Notes (Signed)
EDP at bedside  

## 2019-12-14 NOTE — ED Provider Notes (Addendum)
[facility] DEPT Provider Note   CSN: [id number] Arrival date & time: 12/14/19  [id number]     History Chief Complaint  Patient presents with  . Emesis    Diana Fuentes is a 29 y.o. female.  Third visit to the emergency department this week for nausea and vomiting.  Emesis is described as bilious.  No diarrhea, dysuria, cough, bloody emesis.  She does smoke marijuana.  Severity is moderate.  Nothing makes symptoms better or worse.        Past Medical History:  Diagnosis Date  . Adrenal adenoma   . Anxiety   . Chlamydia   . Depression   . Ear infection     Patient Active Problem List   Diagnosis Date Noted  . Pelvic pain 07/26/2018    Past Surgical History:  Procedure Laterality Date  . LAPAROSCOPIC LYSIS OF ADHESIONS N/A 11/02/2018   Procedure: LAPAROSCOPIC LYSIS OF ADHESIONS;  Surgeon: Emily Filbert, MD;  Location: [facility];  Service: Gynecology;  Laterality: N/A;  . LAPAROSCOPY N/A 11/02/2018   Procedure: LAPAROSCOPY DIAGNOSTIC;  Surgeon: Emily Filbert, MD;  Location: [facility];  Service: Gynecology;  Laterality: N/A;  . WISDOM TOOTH EXTRACTION       OB History    Gravida  1   Para  1   Term  1   Preterm      AB      Living  1     SAB      TAB      Ectopic      Multiple      Live Births  1           Family History  Problem Relation Age of Onset  . Diabetes Maternal Aunt   . Diabetes Maternal Uncle   . Diabetes Maternal Grandmother     Social History   Tobacco Use  . Smoking status: Former Research scientist (life sciences)  . Smokeless tobacco: Current User  . Tobacco comment: vape  Substance Use Topics  . Alcohol use: Yes    Comment: occ  . Drug use: No    Home Medications Prior to Admission medications   Medication Sig Start Date End Date Taking? Authorizing Provider  promethazine (PHENERGAN) 25 MG suppository Place 1 suppository (25 mg total) rectally every 6 (six) hours as needed for  nausea or vomiting. 12/12/19  Yes Albrizze, Kaitlyn E, PA-C  norgestrel-ethinyl estradiol (LO/OVRAL) 0.3-30 MG-MCG tablet Take 1 tablet by mouth daily. Patient not taking: Reported on 12/14/2019 09/14/19   Emily Filbert, MD  ondansetron (ZOFRAN ODT) 8 MG disintegrating tablet Take 1 tablet (8 mg total) by mouth every 8 (eight) hours as needed for nausea or vomiting. 12/14/19   Nat Christen, MD  pantoprazole (PROTONIX) 40 MG tablet Take 1 tablet (40 mg total) by mouth daily. 12/14/19   Nat Christen, MD    Allergies    Shellfish allergy  Review of Systems   Review of Systems  All other systems reviewed and are negative.   Physical Exam Updated Vital Signs BP (!) 141/75   Pulse (!) 37   Temp 98.1 F (36.7 C) (Oral)   Resp 18   LMP 11/24/2019 (Exact Date) Comment: neg preg test  12/12/19  SpO2 91%   Physical Exam Vitals and nursing note reviewed.  Constitutional:      Appearance: She is well-developed.     Comments: nad  HENT:     Head: Normocephalic and atraumatic.  Eyes:     Conjunctiva/sclera: Conjunctivae normal.  Cardiovascular:     Rate and Rhythm: Normal rate and regular rhythm.  Pulmonary:     Effort: Pulmonary effort is normal.     Breath sounds: Normal breath sounds.  Abdominal:     General: Bowel sounds are normal.     Palpations: Abdomen is soft.  Musculoskeletal:        General: Normal range of motion.     Cervical back: Neck supple.  Skin:    General: Skin is warm and dry.  Neurological:     General: No focal deficit present.     Mental Status: She is alert and oriented to person, place, and time.  Psychiatric:        Behavior: Behavior normal.     ED Results / Procedures / Treatments   Labs (all labs ordered are listed, but only abnormal results are displayed) Labs Reviewed  CBC WITH DIFFERENTIAL/PLATELET - Abnormal; Notable for the following components:      Result Value   WBC 12.5 (*)    Neutro Abs 10.6 (*)    All other components within normal limits    COMPREHENSIVE METABOLIC PANEL - Abnormal; Notable for the following components:   Potassium 3.3 (*)    CO2 20 (*)    Glucose, Bld 123 (*)    Calcium 8.3 (*)    ALT 46 (*)    All other components within normal limits  LIPASE, BLOOD - Abnormal; Notable for the following components:   Lipase 71 (*)    All other components within normal limits    EKG None  Radiology CT Abdomen Pelvis W Contrast  Result Date: 12/14/2019 CLINICAL DATA:  Persistent abdominal pain and vomiting. EXAM: CT ABDOMEN AND PELVIS WITH CONTRAST TECHNIQUE: Multidetector CT imaging of the abdomen and pelvis was performed using the standard protocol following bolus administration of intravenous contrast. CONTRAST:  181mL OMNIPAQUE IOHEXOL 300 MG/ML  SOLN COMPARISON:  Right upper quadrant ultrasound dated May 20, 2019. CT abdomen pelvis dated January 11, 2018. FINDINGS: Lower chest: No acute abnormality. Hepatobiliary: Unchanged diffuse hepatic steatosis. 15 mm slightly hyperenhancing lesion in the posterior right hepatic lobe. The gallbladder is unremarkable. No biliary dilatation. Pancreas: Unremarkable. No pancreatic ductal dilatation or surrounding inflammatory changes. Spleen: Normal in size without focal abnormality. Adrenals/Urinary Tract: Interval increase in size of a left adrenal adenoma, now measuring 2.3 cm, previously 1.5 cm. The right adrenal gland is unremarkable. Kidneys are normal without focal mass, calculus, or hydronephrosis. The bladder is unremarkable. Stomach/Bowel: Stomach is within normal limits. Appendix appears normal. No evidence of bowel wall thickening, distention, or inflammatory changes. Vascular/Lymphatic: No significant vascular findings are present. No enlarged abdominal or pelvic lymph nodes. Reproductive: Uterus and bilateral adnexa are unremarkable. Other: Trace free fluid in the pelvis, likely physiologic. No pneumoperitoneum. Musculoskeletal: No acute or significant osseous findings.  Progressive lower lumbar degenerative disc disease with enlarging central disc protrusion at L4-L5 and new small right paracentral disc extrusion versus sequestered disc fragment posterior to the L5 vertebral body (series 6, image 75). IMPRESSION: 1. No acute intra-abdominal process. 2. Unchanged diffuse hepatic steatosis. 3. 15 mm slightly hyperenhancing lesion in the posterior right hepatic lobe, presumably a flash filling hemangioma. If definitive characterization is required, consider follow-up MRI abdomen without and with contrast in 6 months. 4. Interval increase in size of a now 2.3 cm left adrenal nodule, previously characterized as an adenoma on prior CT from April 2019. Electronically Signed  By: Titus Dubin M.D.   On: 12/14/2019 13:48    Procedures Procedures (including critical care time)  Medications Ordered in ED Medications  ondansetron (ZOFRAN) injection 4 mg (4 mg Intravenous Given 12/14/19 1015)  sodium chloride 0.9 % bolus 1,000 mL (0 mLs Intravenous Stopped 12/14/19 1138)  pantoprazole (PROTONIX) injection 40 mg (40 mg Intravenous Given 12/14/19 1015)  sodium chloride 0.9 % bolus 1,000 mL (0 mLs Intravenous Stopped 12/14/19 1138)  ondansetron (ZOFRAN) injection 4 mg (4 mg Intravenous Given 12/14/19 1136)  sodium chloride (PF) 0.9 % injection (  Given by Other 12/14/19 1323)  iohexol (OMNIPAQUE) 300 MG/ML solution 100 mL (100 mLs Intravenous Contrast Given 12/14/19 1303)  promethazine (PHENERGAN) injection 12.5 mg (12.5 mg Intravenous Given 12/14/19 1422)  sodium chloride 0.9 % bolus 1,000 mL (0 mLs Intravenous Stopped 12/14/19 1512)    ED Course  I have reviewed the triage vital signs and the nursing notes.  Pertinent labs & imaging results that were available during my care of the patient were reviewed by me and considered in my medical decision making (see chart for details).    MDM Rules/Calculators/A&P                      Will recheck labs, hydrate, IV Zofran, IV  Protonix.  Patient rechecked several times.  Discussed elevated lipase and all of the abnormal findings on the CT scan.  She understands and agrees.  Advised to stop smoking marijuana and drinking alcohol.  Charge medications Zofran 8 mg and Protonix 40 mg.  Copy of CT scan given to patient and discussed thoroughly. Final Clinical Impression(s) / ED Diagnoses Final diagnoses:  Nausea and vomiting, intractability of vomiting not specified, unspecified vomiting type  Elevated lipase    Rx / DC Orders ED Discharge Orders         Ordered    ondansetron (ZOFRAN ODT) 8 MG disintegrating tablet  Every 8 hours PRN     12/14/19 1511    pantoprazole (PROTONIX) 40 MG tablet  Daily     12/14/19 1511           Nat Christen, MD 12/14/19 [id number]    Nat Christen, MD 12/14/19 1523    Nat Christen, MD 12/14/19 [phone]

## 2019-12-14 NOTE — ED Notes (Signed)
Pt requesting additional nausea medication.  Provider made aware.

## 2019-12-15 ENCOUNTER — Encounter (HOSPITAL_BASED_OUTPATIENT_CLINIC_OR_DEPARTMENT_OTHER): Payer: Self-pay | Admitting: *Deleted

## 2019-12-15 ENCOUNTER — Other Ambulatory Visit: Payer: Self-pay

## 2019-12-15 ENCOUNTER — Emergency Department (HOSPITAL_BASED_OUTPATIENT_CLINIC_OR_DEPARTMENT_OTHER)
Admission: EM | Admit: 2019-12-15 | Discharge: 2019-12-16 | Disposition: A | Discharge status: Home / Self Care | Payer: 59 | Attending: Emergency Medicine | Admitting: Emergency Medicine

## 2019-12-15 DIAGNOSIS — R112 Nausea with vomiting, unspecified: Secondary | ICD-10-CM | POA: Insufficient documentation

## 2019-12-15 DIAGNOSIS — F1210 Cannabis abuse, uncomplicated: Secondary | ICD-10-CM | POA: Insufficient documentation

## 2019-12-15 DIAGNOSIS — D3502 Benign neoplasm of left adrenal gland: Secondary | ICD-10-CM | POA: Diagnosis not present

## 2019-12-15 MED ORDER — ONDANSETRON HCL 4 MG/2ML IJ SOLN
4.0000 mg | Freq: Once | INTRAMUSCULAR | Status: AC
  Administered 2019-12-15: 4 mg via INTRAVENOUS
  Filled 2019-12-15: qty 2

## 2019-12-15 MED ORDER — SODIUM CHLORIDE 0.9 % IV BOLUS
1000.0000 mL | Freq: Once | INTRAVENOUS | Status: AC
  Administered 2019-12-15: 1000 mL via INTRAVENOUS

## 2019-12-15 MED ORDER — PANTOPRAZOLE SODIUM 40 MG IV SOLR
40.0000 mg | Freq: Once | INTRAVENOUS | Status: AC
  Administered 2019-12-15: 40 mg via INTRAVENOUS
  Filled 2019-12-15: qty 40

## 2019-12-15 NOTE — ED Triage Notes (Addendum)
Nausea and vomiting x 4 days. Last used marijuana 2 days before the vomiting started.

## 2019-12-15 NOTE — ED Provider Notes (Signed)
[facility] DEPT [facility] Provider Note: Georgena Spurling, MD, FACEP  CSN: [id number] MRN: [id number] ARRIVAL: 12/15/19 at 2241 ROOM: [facility]  Vomiting   HISTORY OF PRESENT ILLNESS  12/15/19 11:40 PM Diana Fuentes is a 29 y.o. female with her fourth visit to the ED since 12/12/2019 for persistent nausea and vomiting.  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  Her white count has been persistently elevated though less so yesterday.  C-Met yesterday showed slightly elevated AST and decreased CO2 and potassium.  She states she last used marijuana 2 days before the vomiting began.  She returns with continued persistent nausea and vomiting since about 8 PM.  She attributes this to eating a shrimp.  She has a questionable history of shrimp allergy.  She denies any abdominal pain.  Vomiting has not been controlled with home medications.    Past Medical History:  Diagnosis Date  . Adrenal adenoma   . Anxiety   . Chlamydia   . Depression   . Ear infection     Past Surgical History:  Procedure Laterality Date  . LAPAROSCOPIC LYSIS OF ADHESIONS N/A 11/02/2018   Procedure: LAPAROSCOPIC LYSIS OF ADHESIONS;  Surgeon: Emily Filbert, MD;  Location: [facility];  Service: Gynecology;  Laterality: N/A;  . LAPAROSCOPY N/A 11/02/2018   Procedure: LAPAROSCOPY DIAGNOSTIC;  Surgeon: Emily Filbert, MD;  Location: [facility];  Service: Gynecology;  Laterality: N/A;  . WISDOM TOOTH EXTRACTION      Family History  Problem Relation Age of Onset  . Diabetes Maternal Aunt   . Diabetes Maternal Uncle   . Diabetes Maternal Grandmother     Social History   Tobacco Use  . Smoking status: Former Research scientist (life sciences)  . Smokeless tobacco: Current User  . Tobacco comment: vape  Substance Use Topics  . Alcohol use: Yes    Comment: occ  . Drug use:  Yes    Types: Marijuana    Prior to Admission medications   Medication Sig Start Date End Date Taking? Authorizing Provider  ondansetron (ZOFRAN ODT) 8 MG disintegrating tablet Take 1 tablet (8 mg total) by mouth every 8 (eight) hours as needed for nausea or vomiting. 12/14/19   Nat Christen, MD  pantoprazole (PROTONIX) 40 MG tablet Take 1 tablet (40 mg total) by mouth daily. 12/14/19   Nat Christen, MD  promethazine (PHENERGAN) 25 MG suppository Place 1 suppository (25 mg total) rectally every 6 (six) hours as needed for nausea or vomiting. 12/12/19   Albrizze, Verline Lema E, PA-C  norgestrel-ethinyl estradiol (LO/OVRAL) 0.3-30 MG-MCG tablet Take 1 tablet by mouth daily. Patient not taking: Reported on 12/14/2019 09/14/19 12/16/19  Emily Filbert, MD    Allergies Shellfish allergy   REVIEW OF SYSTEMS  Negative except as noted here or in the History of Present Illness.   PHYSICAL EXAMINATION  Initial Vital Signs Blood pressure 105/79, pulse (!) 58, temperature 98.4 F (36.9 C), temperature source Oral, resp. rate 20, height '5\' 6"'  (1.676 m), weight (!) 205 kg, last menstrual period 11/24/2019, SpO2 99 %.  Examination General: Well-developed, well-nourished female in no acute distress; appearance consistent with age of record HENT: normocephalic; atraumatic Eyes: pupils equal, round and reactive to light; extraocular muscles intact Neck: supple Heart: regular rate and rhythm Lungs: clear to auscultation bilaterally Abdomen: soft; nondistended; nontender; bowel sounds present Extremities:  No deformity; full range of motion; pulses normal Neurologic: Awake, alert and oriented; motor function intact in all extremities and symmetric; no facial droop Skin: Warm and dry Psychiatric: Normal mood and affect   RESULTS  Summary of this visit's results, reviewed and interpreted by myself:   EKG Interpretation  Date/Time:    Ventricular Rate:    PR Interval:    QRS Duration:   QT Interval:    QTC  Calculation:   R Axis:     Text Interpretation:        Laboratory Studies: Results for orders placed or performed during the hospital encounter of 12/15/19 (from the past 24 hour(s))  Comprehensive metabolic panel     Status: Abnormal   Collection Time: 12/15/19 11:57 PM  Result Value Ref Range   Sodium 135 135 - 145 mmol/L   Potassium 3.2 (L) 3.5 - 5.1 mmol/L   Chloride 102 98 - 111 mmol/L   CO2 22 22 - 32 mmol/L   Glucose, Bld 123 (H) 70 - 99 mg/dL   BUN 10 6 - 20 mg/dL   Creatinine, Ser 0.67 0.44 - 1.00 mg/dL   Calcium 8.6 (L) 8.9 - 10.3 mg/dL   Total Protein 7.3 6.5 - 8.1 g/dL   Albumin 4.4 3.5 - 5.0 g/dL   AST 16 15 - 41 U/L   ALT 34 0 - 44 U/L   Alkaline Phosphatase 52 38 - 126 U/L   Total Bilirubin 0.9 0.3 - 1.2 mg/dL   GFR calc non Af Amer >60 >60 mL/min   GFR calc Af Amer >60 >60 mL/min   Anion gap 11 5 - 15  CBC with Differential/Platelet     Status: Abnormal   Collection Time: 12/15/19 11:57 PM  Result Value Ref Range   WBC 18.5 (H) 4.0 - 10.5 K/uL   RBC 4.35 3.87 - 5.11 MIL/uL   Hemoglobin 13.3 12.0 - 15.0 g/dL   HCT 39.5 36.0 - 46.0 %   MCV 90.8 80.0 - 100.0 fL   MCH 30.6 26.0 - 34.0 pg   MCHC 33.7 30.0 - 36.0 g/dL   RDW 12.1 11.5 - 15.5 %   Platelets 393 150 - 400 K/uL   nRBC 0.0 0.0 - 0.2 %   Neutrophils Relative % 85 %   Neutro Abs 15.6 (H) 1.7 - 7.7 K/uL   Lymphocytes Relative 10 %   Lymphs Abs 1.9 0.7 - 4.0 K/uL   Monocytes Relative 4 %   Monocytes Absolute 0.6 0.1 - 1.0 K/uL   Eosinophils Relative 0 %   Eosinophils Absolute 0.0 0.0 - 0.5 K/uL   Basophils Relative 0 %   Basophils Absolute 0.0 0.0 - 0.1 K/uL   Immature Granulocytes 1 %   Abs Immature Granulocytes 0.26 (H) 0.00 - 0.07 K/uL  Lipase, blood     Status: None   Collection Time: 12/15/19 11:57 PM  Result Value Ref Range   Lipase 21 11 - 51 U/L   Imaging Studies: CT Abdomen Pelvis W Contrast  Result Date: 12/14/2019 CLINICAL DATA:  Persistent abdominal pain and vomiting. EXAM: CT  ABDOMEN AND PELVIS WITH CONTRAST TECHNIQUE: Multidetector CT imaging of the abdomen and pelvis was performed using the standard protocol following bolus administration of intravenous contrast. CONTRAST:  152m OMNIPAQUE IOHEXOL 300 MG/ML  SOLN COMPARISON:  Right upper quadrant ultrasound dated May 20, 2019. CT abdomen pelvis dated January 11, 2018. FINDINGS: Lower chest: No acute abnormality. Hepatobiliary: Unchanged diffuse hepatic steatosis. 15 mm slightly hyperenhancing  lesion in the posterior right hepatic lobe. The gallbladder is unremarkable. No biliary dilatation. Pancreas: Unremarkable. No pancreatic ductal dilatation or surrounding inflammatory changes. Spleen: Normal in size without focal abnormality. Adrenals/Urinary Tract: Interval increase in size of a left adrenal adenoma, now measuring 2.3 cm, previously 1.5 cm. The right adrenal gland is unremarkable. Kidneys are normal without focal mass, calculus, or hydronephrosis. The bladder is unremarkable. Stomach/Bowel: Stomach is within normal limits. Appendix appears normal. No evidence of bowel wall thickening, distention, or inflammatory changes. Vascular/Lymphatic: No significant vascular findings are present. No enlarged abdominal or pelvic lymph nodes. Reproductive: Uterus and bilateral adnexa are unremarkable. Other: Trace free fluid in the pelvis, likely physiologic. No pneumoperitoneum. Musculoskeletal: No acute or significant osseous findings. Progressive lower lumbar degenerative disc disease with enlarging central disc protrusion at L4-L5 and new small right paracentral disc extrusion versus sequestered disc fragment posterior to the L5 vertebral body (series 6, image 75). IMPRESSION: 1. No acute intra-abdominal process. 2. Unchanged diffuse hepatic steatosis. 3. 15 mm slightly hyperenhancing lesion in the posterior right hepatic lobe, presumably a flash filling hemangioma. If definitive characterization is required, consider follow-up MRI  abdomen without and with contrast in 6 months. 4. Interval increase in size of a now 2.3 cm left adrenal nodule, previously characterized as an adenoma on prior CT from April 2019. Electronically Signed   By: Titus Dubin M.D.   On: 12/14/2019 13:48    ED COURSE and MDM  Nursing notes, initial and subsequent vitals signs, including pulse oximetry, reviewed and interpreted by myself.  Vitals:   12/15/19 2250 12/15/19 2253 12/16/19 0106 12/16/19 0403  BP:  105/79 (!) 147/79 (!) 111/56  Pulse:  (!) 58 (!) 51 64  Resp:  '20 18 18  ' Temp:  98.4 F (36.9 C)    TempSrc:  Oral    SpO2:  99% 100% 99%  Weight: (!) 205 kg     Height: '5\' 6"'  (1.676 m)      Medications  sodium chloride 0.9 % bolus 1,000 mL (0 mLs Intravenous Stopped 12/16/19 0055)  ondansetron (ZOFRAN) injection 4 mg (4 mg Intravenous Given 12/15/19 2357)  pantoprazole (PROTONIX) injection 40 mg (40 mg Intravenous Given 12/15/19 2357)  famotidine (PEPCID) IVPB 20 mg premix (0 mg Intravenous Stopped 12/16/19 0236)  haloperidol lactate (HALDOL) injection 2 mg (2 mg Intravenous Given 12/16/19 0252)   2:45 AM Patient still vomiting despite Zofran and Pepcid IV.  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  This could also represent food allergies as the patient does attribute this particular episode to eating shrimp.  We will try Haldol 2 mg IV and reassess.  5:39 AM Patient feeling better after Haldol, keeping fluids down and ready to go home.  She was advised to cut out all cannabis.  I am very concerned that this represents cannabis associated hyperemesis syndrome.  PROCEDURES  Procedures   ED DIAGNOSES     ICD-10-CM   1. Nausea and vomiting in adult  R11.2   2. Cannabis abuse  F12.10        Jameir Ake, Jenny Reichmann, MD 12/16/19 [phone]

## 2019-12-16 LAB — CBC WITH DIFFERENTIAL/PLATELET
Abs Immature Granulocytes: 0.26 10*3/uL — ABNORMAL HIGH (ref 0.00–0.07)
Basophils Absolute: 0 10*3/uL (ref 0.0–0.1)
Basophils Relative: 0 %
Eosinophils Absolute: 0 10*3/uL (ref 0.0–0.5)
Eosinophils Relative: 0 %
HCT: 39.5 % (ref 36.0–46.0)
Hemoglobin: 13.3 g/dL (ref 12.0–15.0)
Immature Granulocytes: 1 %
Lymphocytes Relative: 10 %
Lymphs Abs: 1.9 10*3/uL (ref 0.7–4.0)
MCH: 30.6 pg (ref 26.0–34.0)
MCHC: 33.7 g/dL (ref 30.0–36.0)
MCV: 90.8 fL (ref 80.0–100.0)
Monocytes Absolute: 0.6 10*3/uL (ref 0.1–1.0)
Monocytes Relative: 4 %
Neutro Abs: 15.6 10*3/uL — ABNORMAL HIGH (ref 1.7–7.7)
Neutrophils Relative %: 85 %
Platelets: 393 10*3/uL (ref 150–400)
RBC: 4.35 MIL/uL (ref 3.87–5.11)
RDW: 12.1 % (ref 11.5–15.5)
WBC: 18.5 10*3/uL — ABNORMAL HIGH (ref 4.0–10.5)
nRBC: 0 % (ref 0.0–0.2)

## 2019-12-16 LAB — COMPREHENSIVE METABOLIC PANEL
ALT: 34 U/L (ref 0–44)
AST: 16 U/L (ref 15–41)
Albumin: 4.4 g/dL (ref 3.5–5.0)
Alkaline Phosphatase: 52 U/L (ref 38–126)
Anion gap: 11 (ref 5–15)
BUN: 10 mg/dL (ref 6–20)
CO2: 22 mmol/L (ref 22–32)
Calcium: 8.6 mg/dL — ABNORMAL LOW (ref 8.9–10.3)
Chloride: 102 mmol/L (ref 98–111)
Creatinine, Ser: 0.67 mg/dL (ref 0.44–1.00)
GFR calc Af Amer: >60 mL/min (ref >60)
GFR calc non Af Amer: >60 mL/min (ref >60)
Glucose, Bld: 123 mg/dL — ABNORMAL HIGH (ref 70–99)
Potassium: 3.2 mmol/L — ABNORMAL LOW (ref 3.5–5.1)
Sodium: 135 mmol/L (ref 135–145)
Total Bilirubin: 0.9 mg/dL (ref 0.3–1.2)
Total Protein: 7.3 g/dL (ref 6.5–8.1)

## 2019-12-16 LAB — RAPID URINE DRUG SCREEN, HOSP PERFORMED
Amphetamines: NOT DETECTED
Barbiturates: NOT DETECTED
Benzodiazepines: NOT DETECTED
Cocaine: NOT DETECTED
Opiates: NOT DETECTED
Tetrahydrocannabinol: POSITIVE — AB

## 2019-12-16 LAB — LIPASE, BLOOD: Lipase: 21 U/L (ref 11–51)

## 2019-12-16 MED ORDER — FAMOTIDINE IN NACL 20-0.9 MG/50ML-% IV SOLN
20.0000 mg | Freq: Once | INTRAVENOUS | Status: AC
  Administered 2019-12-16: 20 mg via INTRAVENOUS
  Filled 2019-12-16: qty 50

## 2019-12-16 MED ORDER — HALOPERIDOL LACTATE 5 MG/ML IJ SOLN
2.0000 mg | Freq: Once | INTRAMUSCULAR | Status: AC
  Administered 2019-12-16: 2 mg via INTRAVENOUS
  Filled 2019-12-16: qty 1

## 2020-03-04 ENCOUNTER — Other Ambulatory Visit (HOSPITAL_COMMUNITY)
Admission: RE | Admit: 2020-03-04 | Discharge: 2020-03-04 | Disposition: A | Discharge status: Home / Self Care | Payer: 59 | Source: Ambulatory Visit | Attending: Family Medicine | Admitting: Family Medicine

## 2020-03-04 ENCOUNTER — Ambulatory Visit (INDEPENDENT_AMBULATORY_CARE_PROVIDER_SITE_OTHER): Payer: 59 | Admitting: Family Medicine

## 2020-03-04 ENCOUNTER — Other Ambulatory Visit: Payer: Self-pay

## 2020-03-04 ENCOUNTER — Encounter: Payer: Self-pay | Admitting: Family Medicine

## 2020-03-04 VITALS — BP 128/82 | HR 75 | Ht 66.0 in | Wt 199.0 lb

## 2020-03-04 DIAGNOSIS — R102 Pelvic and perineal pain: Secondary | ICD-10-CM | POA: Diagnosis not present

## 2020-03-04 DIAGNOSIS — Z01419 Encounter for gynecological examination (general) (routine) without abnormal findings: Secondary | ICD-10-CM | POA: Insufficient documentation

## 2020-03-04 DIAGNOSIS — Z113 Encounter for screening for infections with a predominantly sexual mode of transmission: Secondary | ICD-10-CM | POA: Diagnosis not present

## 2020-03-04 NOTE — Progress Notes (Signed)
GYNECOLOGY ANNUAL PREVENTATIVE CARE ENCOUNTER NOTE  Subjective:   Diana Fuentes is a 29 y.o. G25P1001 female here for a routine annual gynecologic exam.  Current complaints: left sided pelvic pain with intercourse. Triggered by deep penetration. Able to have pain free sex without deep penetration, but also occasionally able to have deep penetration without pain. Initially started after she had an IUD, persisted after removal. Was seen by Dr Hulan Fray, who did a diagnostic laparoscopy with lysis of adhesions in 10/2018. Was pain free for about 1 year, then started a couple of months ago. No abnormal discharge, irregular bleeding. Will occasionally have same pain during period, but not always.   Denies abnormal vaginal bleeding, discharge, pelvic pain, problems with intercourse or other gynecologic concerns.    Gynecologic History Patient's last menstrual period was 02/17/2020. Patient is sexually active - heterosexual Contraception: none - attempting to get pregnant Last Pap: 3 years ago. Results were: normal  Obstetric History OB History  Gravida Para Term Preterm AB Living  1 1 1     1   SAB TAB Ectopic Multiple Live Births          1    # Outcome Date GA Lbr Len/2nd Weight Sex Delivery Anes PTL Lv  1 Term 05/22/10    [address]       Past Medical History:  Diagnosis Date  . Adrenal adenoma   . Anxiety   . Chlamydia   . Depression   . Ear infection     Past Surgical History:  Procedure Laterality Date  . LAPAROSCOPIC LYSIS OF ADHESIONS N/A 11/02/2018   Procedure: LAPAROSCOPIC LYSIS OF ADHESIONS;  Surgeon: Emily Filbert, MD;  Location: [facility];  Service: Gynecology;  Laterality: N/A;  . LAPAROSCOPY N/A 11/02/2018   Procedure: LAPAROSCOPY DIAGNOSTIC;  Surgeon: Emily Filbert, MD;  Location: [facility];  Service: Gynecology;  Laterality: N/A;  . WISDOM TOOTH EXTRACTION      Current Outpatient Medications on File Prior to Visit  Medication Sig  Dispense Refill  . ondansetron (ZOFRAN ODT) 8 MG disintegrating tablet Take 1 tablet (8 mg total) by mouth every 8 (eight) hours as needed for nausea or vomiting. (Patient not taking: Reported on 03/04/2020) 10 tablet 0  . pantoprazole (PROTONIX) 40 MG tablet Take 1 tablet (40 mg total) by mouth daily. (Patient not taking: Reported on 03/04/2020) 10 tablet 0  . promethazine (PHENERGAN) 25 MG suppository Place 1 suppository (25 mg total) rectally every 6 (six) hours as needed for nausea or vomiting. (Patient not taking: Reported on 03/04/2020) 12 each 0  . [DISCONTINUED] norgestrel-ethinyl estradiol (LO/OVRAL) 0.3-30 MG-MCG tablet Take 1 tablet by mouth daily. (Patient not taking: Reported on 12/14/2019) 1 Package 11   No current facility-administered medications on file prior to visit.    Allergies  Allergen Reactions  . Shellfish Allergy Rash and Shortness Of Breath    Social History   Socioeconomic History  . Marital status: Legally Separated    Spouse name: Not on file  . Number of children: 1  . Years of education: Not on file  . Highest education level: High school graduate  Occupational History  . Not on file  Tobacco Use  . Smoking status: Former Research scientist (life sciences)  . Smokeless tobacco: Current User  . Tobacco comment: vape  Vaping Use  . Vaping Use: Every day  . Substances: Flavoring  Substance and Sexual Activity  . Alcohol use: Yes    Comment: occ  .  Drug use: Yes    Types: Marijuana  . Sexual activity: Yes    Birth control/protection: None  Other Topics Concern  . Not on file  Social History Narrative  . Not on file   Social Determinants of Health   Financial Resource Strain:   . Difficulty of Paying Living Expenses:   Food Insecurity:   . Worried About Charity fundraiser in the Last Year:   . Arboriculturist in the Last Year:   Transportation Needs:   . Film/video editor (Medical):   Marland Kitchen Lack of Transportation (Non-Medical):   Physical Activity:   . Days of  Exercise per Week:   . Minutes of Exercise per Session:   Stress:   . Feeling of Stress :   Social Connections:   . Frequency of Communication with Friends and Family:   . Frequency of Social Gatherings with Friends and Family:   . Attends Religious Services:   . Active Member of Clubs or Organizations:   . Attends Archivist Meetings:   Marland Kitchen Marital Status:   Intimate Partner Violence:   . Fear of Current or Ex-Partner:   . Emotionally Abused:   Marland Kitchen Physically Abused:   . Sexually Abused:     Family History  Problem Relation Age of Onset  . Diabetes Maternal Aunt   . Diabetes Maternal Uncle   . Diabetes Maternal Grandmother     The following portions of the patient's history were reviewed and updated as appropriate: allergies, current medications, past family history, past medical history, past social history, past surgical history and problem list.  Review of Systems Pertinent items are noted in HPI.   Objective:  BP 128/82   Pulse 75   Ht 5\' 6"  (1.676 m)   Wt 199 lb (90.3 kg)   LMP 02/17/2020   BMI 32.12 kg/m  Wt Readings from Last 3 Encounters:  03/04/20 199 lb (90.3 kg)  12/15/19 (!) 451 lb 15.1 oz (205 kg)  12/12/19 205 lb (93 kg)     Chaperone present during exam  CONSTITUTIONAL: Well-developed, well-nourished female in no acute distress.  HENT:  Normocephalic, atraumatic, External right and left ear normal. Oropharynx is clear and moist EYES: Conjunctivae and EOM are normal. Pupils are equal, round, and reactive to light. No scleral icterus.  NECK: Normal range of motion, supple, no masses.  Normal thyroid.   CARDIOVASCULAR: Normal heart rate noted, regular rhythm RESPIRATORY: Clear to auscultation bilaterally. Effort and breath sounds normal, no problems with respiration noted. BREASTS: Symmetric in size. No masses, skin changes, nipple drainage, or lymphadenopathy. ABDOMEN: Soft, normal bowel sounds, no distention noted.  No tenderness, rebound or  guarding.  PELVIC: Normal appearing external genitalia; normal appearing vaginal mucosa and cervix.  No abnormal discharge noted.  Normal uterine size, no other palpable masses, no uterine or adnexal tenderness. MUSCULOSKELETAL: Normal range of motion. No tenderness.  No cyanosis, clubbing, or edema.  2+ distal pulses. SKIN: Skin is warm and dry. No rash noted. Not diaphoretic. No erythema. No pallor. NEUROLOGIC: Alert and oriented to person, place, and time. Normal reflexes, muscle tone coordination. No cranial nerve deficit noted. PSYCHIATRIC: Normal mood and affect. Normal behavior. Normal judgment and thought content.  Assessment:  Annual gynecologic examination with pap smear   Plan:  1. Well Woman Exam Will follow up results of pap smear and manage accordingly. Mammogram scheduled STD testing discussed. Patient requested testing - Cytology - PAP( [facility]) - Hepatitis C Antibody -  Hepatitis B Surface AntiGEN - RPR - HIV antibody (with reflex) - Cervicovaginal ancillary only( [facility])  2. Screening for STD (sexually transmitted disease) - Hepatitis C Antibody - Hepatitis B Surface AntiGEN - RPR - HIV antibody (with reflex) - Cervicovaginal ancillary only( [facility])  3. Pelvic pain Discussed cervical pain with deep penetration. Cultures to r/o infection. [place] to r/o other causes for pain. Recommended journaling to see when pain occurs with relation to cycle.  - US PELVIS TRANSVAGINAL NON-OB (TV ONLY); Future   Routine preventative health maintenance measures emphasized. Please refer to After Visit Summary for other counseling recommendations.    Loma Boston, [place] for [employer]

## 2020-03-05 ENCOUNTER — Other Ambulatory Visit: Payer: Self-pay

## 2020-03-05 DIAGNOSIS — B379 Candidiasis, unspecified: Secondary | ICD-10-CM

## 2020-03-05 DIAGNOSIS — B9689 Other specified bacterial agents as the cause of diseases classified elsewhere: Secondary | ICD-10-CM

## 2020-03-05 LAB — CERVICOVAGINAL ANCILLARY ONLY
Bacterial Vaginitis (gardnerella): POSITIVE — AB
Candida Glabrata: NEGATIVE
Candida Vaginitis: POSITIVE — AB
Comment: NEGATIVE
Comment: NEGATIVE
Comment: NEGATIVE
Comment: NEGATIVE
Trichomonas: NEGATIVE

## 2020-03-05 LAB — HEPATITIS B SURFACE ANTIGEN: Hepatitis B Surface Ag: NEGATIVE

## 2020-03-05 LAB — HIV ANTIBODY (ROUTINE TESTING W REFLEX): HIV Screen 4th Generation wRfx: NONREACTIVE

## 2020-03-05 LAB — RPR: RPR Ser Ql: NONREACTIVE

## 2020-03-05 LAB — HEPATITIS C ANTIBODY: Hep C Virus Ab: <0.1 s/co ratio (ref 0.0–0.9)

## 2020-03-05 MED ORDER — FLUCONAZOLE 150 MG PO TABS: ORAL_TABLET | ORAL | 1 refills | Status: DC

## 2020-03-05 MED ORDER — METRONIDAZOLE 500 MG PO TABS: 500.0000 mg | ORAL_TABLET | Freq: Two times a day (BID) | ORAL | 0 refills | Status: DC

## 2020-03-05 NOTE — Progress Notes (Signed)
Pt tested positive for BV and yeast.Pt sent Mychart message requesting medication for BV and yeast. Flagyl 500 mg BID x 7 days  and Diflucan 150 mg with 1 refill was sent to pt's pharmacy.  Sean Macwilliams l Henny Strauch, CMA

## 2020-03-06 LAB — CYTOLOGY - PAP
Chlamydia: NEGATIVE
Comment: NEGATIVE
Comment: NORMAL
Diagnosis: NEGATIVE
Neisseria Gonorrhea: NEGATIVE

## 2020-03-14 DIAGNOSIS — Z419 Encounter for procedure for purposes other than remedying health state, unspecified: Secondary | ICD-10-CM | POA: Diagnosis not present

## 2020-03-22 ENCOUNTER — Ambulatory Visit (HOSPITAL_BASED_OUTPATIENT_CLINIC_OR_DEPARTMENT_OTHER): Admission: RE | Admit: 2020-03-22 | Payer: 59 | Source: Ambulatory Visit

## 2020-04-14 DIAGNOSIS — Z419 Encounter for procedure for purposes other than remedying health state, unspecified: Secondary | ICD-10-CM | POA: Diagnosis not present

## 2020-06-14 DIAGNOSIS — Z419 Encounter for procedure for purposes other than remedying health state, unspecified: Secondary | ICD-10-CM | POA: Diagnosis not present

## 2020-07-15 DIAGNOSIS — Z419 Encounter for procedure for purposes other than remedying health state, unspecified: Secondary | ICD-10-CM | POA: Diagnosis not present

## 2020-08-14 DIAGNOSIS — Z419 Encounter for procedure for purposes other than remedying health state, unspecified: Secondary | ICD-10-CM | POA: Diagnosis not present

## 2020-08-20 ENCOUNTER — Ambulatory Visit (INDEPENDENT_AMBULATORY_CARE_PROVIDER_SITE_OTHER): Payer: 59 | Admitting: Advanced Practice Midwife

## 2020-08-20 ENCOUNTER — Other Ambulatory Visit (HOSPITAL_COMMUNITY)
Admission: RE | Admit: 2020-08-20 | Discharge: 2020-08-20 | Disposition: A | Discharge status: Home / Self Care | Payer: 59 | Source: Ambulatory Visit | Attending: Advanced Practice Midwife | Admitting: Advanced Practice Midwife

## 2020-08-20 ENCOUNTER — Other Ambulatory Visit: Payer: Self-pay

## 2020-08-20 ENCOUNTER — Encounter: Payer: Self-pay | Admitting: Advanced Practice Midwife

## 2020-08-20 VITALS — BP 119/63 | HR 69 | Wt 193.1 lb

## 2020-08-20 DIAGNOSIS — Z3481 Encounter for supervision of other normal pregnancy, first trimester: Secondary | ICD-10-CM

## 2020-08-20 DIAGNOSIS — O09299 Supervision of pregnancy with other poor reproductive or obstetric history, unspecified trimester: Secondary | ICD-10-CM | POA: Insufficient documentation

## 2020-08-20 DIAGNOSIS — Z3480 Encounter for supervision of other normal pregnancy, unspecified trimester: Secondary | ICD-10-CM | POA: Insufficient documentation

## 2020-08-20 DIAGNOSIS — Z8759 Personal history of other complications of pregnancy, childbirth and the puerperium: Secondary | ICD-10-CM

## 2020-08-20 DIAGNOSIS — Z3A09 9 weeks gestation of pregnancy: Secondary | ICD-10-CM

## 2020-08-20 LAB — POCT URINALYSIS DIPSTICK
Bilirubin, UA: NEGATIVE
Blood, UA: NEGATIVE
Glucose, UA: NEGATIVE
Ketones, UA: NEGATIVE
Leukocytes, UA: NEGATIVE
Nitrite, UA: NEGATIVE
Protein, UA: NEGATIVE
Spec Grav, UA: 1.02 (ref 1.010–1.025)
Urobilinogen, UA: 0.2 E.U./dL
pH, UA: 6 (ref 5.0–8.0)

## 2020-08-20 LAB — GC/CHLAMYDIA PROBE AMP (~~LOC~~) NOT AT ARMC
Chlamydia: NEGATIVE
Comment: NEGATIVE
Comment: NORMAL
Neisseria Gonorrhea: NEGATIVE

## 2020-08-20 NOTE — Progress Notes (Signed)
DATING AND VIABILITY SONOGRAM   Diana Fuentes is a 29 y.o. year old G2P1001 with LMP Patient's last menstrual period was 06/14/2020. which would correlate to  [redacted]w[redacted]d weeks gestation.  She has regular menstrual cycles.   She is here today for a confirmatory initial sonogram.    GESTATION: SINGLETON     FETAL ACTIVITY:          Heart rate        171          The fetus is active.   ADNEXA: The ovaries are normal.   GESTATIONAL AGE AND  BIOMETRICS:  Gestational criteria: Estimated Date of Delivery: 03/21/21 by LMP now at [redacted]w[redacted]d  Previous Scans:0  CROWN RUMP LENGTH           2.59 cm      9-3 weeks                                                                                                    AVERAGE EGA(BY THIS SCAN):  9-4 weeks  WORKING EDD( LMP ):  03/21/2021     TECHNICIAN COMMENTS:  Patient informed that the ultrasound is considered a limited obstetric ultrasound and is not intended to be a complete ultrasound exam.  Patient also informed that the ultrasound is not being completed with the intent of assessing for fetal or placental anomalies or any pelvic abnormalities. Explained that the purpose of today's ultrasound is to assess for fetal heart rate.  Patient acknowledges the purpose of the exam and the limitations of the study.      Diana Fuentes 08/20/2020 10:33 AM

## 2020-08-20 NOTE — Patient Instructions (Signed)
Genetic Testing During Pregnancy Genetic testing during pregnancy is also called prenatal genetic testing. This type of testing can determine if your baby is at risk of being born with a disorder caused by abnormal genes or chromosomes (genetic disorder). Chromosomes contain genes that control how your baby will develop in your womb. There are many different genetic disorders. Examples of genetic disorders that may be found through genetic testing include Down syndrome and cystic fibrosis. Gene changes (mutations) can be passed down through families. Genetic testing is offered to all women before or during pregnancy. You can choose whether to have genetic testing. Why is genetic testing done? Genetic testing is done during pregnancy to find out whether your child is at risk for a genetic disorder. Having genetic testing allows you to:  Discuss your test results and options with a genetic counselor.  Prepare for a baby that may be born with a genetic disorder. Learning about the disorder ahead of time helps you be better prepared to manage it. Your health care providers can also be prepared in case your baby requires special care before or after birth.  Consider whether you want to continue with the pregnancy. In some cases, genetic testing may be done to learn about the traits a child will inherit. Types of genetic tests There are two basic types of genetic testing. Screening tests indicate whether your developing baby (fetus) is at higher risk for a genetic disorder. Diagnostic tests check actual fetal cells to diagnose a genetic disorder. Screening tests     Screening tests will not harm your baby. They are recommended for all pregnant women. Types of screening tests include:  Carrier screening. This test involves checking genes from both parents by testing their blood or saliva. The test checks to find out if the parents carry a genetic mutation that may be passed to a baby. In most cases,  both parents must carry the mutation for a baby to be at risk.  First trimester screening. This test combines a blood test with sound wave imaging of your baby (fetal ultrasound). This screening test checks for a risk of Down syndrome or other defects caused by having extra chromosomes. It also checks for defects of the heart, abdomen, or skeleton.  Second trimester screening also combines a blood test with a fetal ultrasound exam. It checks for a risk of genetic defects of the face, brain, spine, heart, or limbs.  Combined or sequential screening. This type of testing combines the results of first and second trimester screening. This type of testing may be more accurate than first or second trimester screening alone.  Cell-free DNA testing. This is a blood test that detects cells released by the placenta that get into the mother's blood. It can be used to check for a risk of Down syndrome, other extra chromosome syndromes, and disorders caused by abnormal numbers of sex chromosomes. This test can be done any time after 10 weeks of pregnancy.  Diagnostic tests Diagnostic tests carry slight risks of problems, including bleeding, infection, and loss of the pregnancy. These tests are done only if your baby is at risk for a genetic disorder. You may meet with a genetic counselor to discuss the risks and benefits before having diagnostic tests. Examples of diagnostic tests include:  Chorionic villus sampling (CVS). This involves a procedure to remove and test a sample of cells taken from the placenta. The procedure may be done between 10 and 12 weeks of pregnancy.  Amniocentesis. This involves a  procedure to remove and test a sample of fluid (amniotic fluid) and cells from the sac that surrounds the developing baby. The procedure may be done between 15 and 20 weeks of pregnancy. What do the results mean? For a screening test:  If the results are negative, it often means that your child is not at higher  risk. There is still a slight chance your child could have a genetic disorder.  If the results are positive, it does not mean your child will have a genetic disorder. It may mean that your child has a higher-than-normal risk for a genetic disorder. In that case, you may want to talk with a genetic counselor about whether you should have diagnostic genetic tests. For a diagnostic test:  If the result is negative, it is unlikely that your child will have a genetic disorder.  If the test is positive for a genetic disorder, it is likely that your child will have the disorder. The test may not tell how severe the disorder will be. Talk with your health care provider about your options. Questions to ask your health care provider Before talking to your health care provider about genetic testing, find out if there is a history of genetic disorders in your family. It may also help to know your family's ethnic origins. Then ask your health care provider the following questions:  Is my baby at risk for a genetic disorder?  What are the benefits of having genetic screening?  What tests are best for me and my baby?  What are the risks of each test?  If I get a positive result on a screening test, what is the next step?  Should I meet with a genetic counselor before having a diagnostic test?  Should my partner or other members of my family be tested?  How much do the tests cost? Will my insurance cover the testing? Summary  Genetic testing is done during pregnancy to find out whether your child is at risk for a genetic disorder.  Genetic testing is offered to all women before or during pregnancy. You can choose whether to have genetic testing.  There are two basic types of genetic testing. Screening tests indicate whether your developing baby (fetus) is at higher risk for a genetic disorder. Diagnostic tests check actual fetal cells to diagnose a genetic disorder.  If a diagnostic genetic test is  positive, talk with your health care provider about your options. This information is not intended to replace advice given to you by your health care provider. Make sure you discuss any questions you have with your health care provider. Document Revised: 12/22/2018 Document Reviewed: 11/15/2017 Elsevier Patient Education  [place] of Pregnancy  The first trimester of pregnancy is from week 1 until the end of week 13 (months 1 through 3). During this time, your baby will begin to develop inside you. At 6-8 weeks, the eyes and face are formed, and the heartbeat can be seen on ultrasound. At the end of 12 weeks, all the baby's organs are formed. Prenatal care is all the medical care you receive before the birth of your baby. Make sure you get good prenatal care and follow all of your doctor's instructions. Follow these instructions at home: Medicines  Take over-the-counter and prescription medicines only as told by your doctor. Some medicines are safe and some medicines are not safe during pregnancy.  Take a prenatal vitamin that contains at least 600 micrograms (mcg) of folic acid.  If you have trouble pooping (constipation), take medicine that will make your stool soft (stool softener) if your doctor approves. Eating and drinking   Eat regular, healthy meals.  Your doctor will tell you the amount of weight gain that is right for you.  Avoid raw meat and uncooked cheese.  If you feel sick to your stomach (nauseous) or throw up (vomit): ? Eat 4 or 5 small meals a day instead of 3 large meals. ? Try eating a few soda crackers. ? Drink liquids between meals instead of during meals.  To prevent constipation: ? Eat foods that are high in fiber, like fresh fruits and vegetables, whole grains, and beans. ? Drink enough fluids to keep your pee (urine) clear or pale yellow. Activity  Exercise only as told by your doctor. Stop exercising if you have cramps or pain in  your lower belly (abdomen) or low back.  Do not exercise if it is too hot, too humid, or if you are in a place of great height (high altitude).  Try to avoid standing for long periods of time. Move your legs often if you must stand in one place for a long time.  Avoid heavy lifting.  Wear low-heeled shoes. Sit and stand up straight.  You can have sex unless your doctor tells you not to. Relieving pain and discomfort  Wear a good support bra if your breasts are sore.  Take warm water baths (sitz baths) to soothe pain or discomfort caused by hemorrhoids. Use hemorrhoid cream if your doctor says it is okay.  Rest with your legs raised if you have leg cramps or low back pain.  If you have puffy, bulging veins (varicose veins) in your legs: ? Wear support hose or compression stockings as told by your doctor. ? Raise (elevate) your feet for 15 minutes, 3-4 times a day. ? Limit salt in your food. Prenatal care  Schedule your prenatal visits by the twelfth week of pregnancy.  Write down your questions. Take them to your prenatal visits.  Keep all your prenatal visits as told by your doctor. This is important. Safety  Wear your seat belt at all times when driving.  Make a list of emergency phone numbers. The list should include numbers for family, friends, the hospital, and police and fire departments. General instructions  Ask your doctor for a referral to a local prenatal class. Begin classes no later than at the start of month 6 of your pregnancy.  Ask for help if you need counseling or if you need help with nutrition. Your doctor can give you advice or tell you where to go for help.  Do not use hot tubs, steam rooms, or saunas.  Do not douche or use tampons or scented sanitary pads.  Do not cross your legs for long periods of time.  Avoid all herbs and alcohol. Avoid drugs that are not approved by your doctor.  Do not use any tobacco products, including cigarettes, chewing  tobacco, and electronic cigarettes. If you need help quitting, ask your doctor. You may get counseling or other support to help you quit.  Avoid cat litter boxes and soil used by cats. These carry germs that can cause birth defects in the baby and can cause a loss of your baby (miscarriage) or stillbirth.  Visit your dentist. At home, brush your teeth with a soft toothbrush. Be gentle when you floss. Contact a doctor if:  You are dizzy.  You have mild cramps or pressure  in your lower belly.  You have a nagging pain in your belly area.  You continue to feel sick to your stomach, you throw up, or you have watery poop (diarrhea).  You have a bad smelling fluid coming from your vagina.  You have pain when you pee (urinate).  You have increased puffiness (swelling) in your face, hands, legs, or ankles. Get help right away if:  You have a fever.  You are leaking fluid from your vagina.  You have spotting or bleeding from your vagina.  You have very bad belly cramping or pain.  You gain or lose weight rapidly.  You throw up blood. It may look like coffee grounds.  You are around people who have [place] measles, fifth disease, or chickenpox.  You have a very bad headache.  You have shortness of breath.  You have any kind of trauma, such as from a fall or a car accident. Summary  The first trimester of pregnancy is from week 1 until the end of week 13 (months 1 through 3).  To take care of yourself and your unborn baby, you will need to eat healthy meals, take medicines only if your doctor tells you to do so, and do activities that are safe for you and your baby.  Keep all follow-up visits as told by your doctor. This is important as your doctor will have to ensure that your baby is healthy and growing well. This information is not intended to replace advice given to you by your health care provider. Make sure you discuss any questions you have with your health care  provider. Document Revised: 12/22/2018 Document Reviewed: 09/08/2016 Elsevier Patient Education  2020 [employer].

## 2020-08-20 NOTE — Progress Notes (Signed)
Subjective:    Lisandra Mathisen is a G2P1001 [redacted]w[redacted]d being seen today for her first obstetrical visit.  Her obstetrical history is significant for pre-eclampsia and depression, adrenal adenoma. States they planned a C/S for preeclampsia, but she went into labor and delivered "before they could do it". Patient does intend to breast feed. Pregnancy history fully reviewed.  Patient reports no complaints.  Vitals:   08/20/20 1006  BP: 119/63  Pulse: 69  Weight: 193 lb 1.9 oz (87.6 kg)    HISTORY: OB History  Gravida Para Term Preterm AB Living  2 1 1     1   SAB TAB Ectopic Multiple Live Births          1    # Outcome Date GA Lbr Len/2nd Weight Sex Delivery Anes PTL Lv  2 Current           1 Term 05/22/10 [redacted]w[redacted]d   M Vag-Spont   LIV     Complications: IUGR (intrauterine growth restriction) affecting care of mother, Preeclampsia   Past Medical History:  Diagnosis Date  . Adrenal adenoma   . Anxiety   . Chlamydia   . Depression   . Ear infection    Past Surgical History:  Procedure Laterality Date  . LAPAROSCOPIC LYSIS OF ADHESIONS N/A 11/02/2018   Procedure: LAPAROSCOPIC LYSIS OF ADHESIONS;  Surgeon: Emily Filbert, MD;  Location: [facility];  Service: Gynecology;  Laterality: N/A;  . LAPAROSCOPY N/A 11/02/2018   Procedure: LAPAROSCOPY DIAGNOSTIC;  Surgeon: Emily Filbert, MD;  Location: [place];  Service: Gynecology;  Laterality: N/A;  . WISDOM TOOTH EXTRACTION     Family History  Problem Relation Age of Onset  . Diabetes Maternal Aunt   . Diabetes Maternal Uncle   . Diabetes Maternal Grandmother      Exam    Uterus:  Fundal Height: 9 cm  Pelvic Exam:    Perineum: No Hemorrhoids, Normal Perineum   Vulva: Bartholin's, Urethra, Skene's normal   Vagina:  normal mucosa   pH:    Cervix: no cervical motion tenderness   Adnexa: no mass, fullness, tenderness   Bony Pelvis: gynecoid  System: Breast:  normal appearance, no masses or tenderness    Skin: normal coloration and turgor, no rashes    Neurologic: oriented, grossly non-focal   Extremities: normal strength, tone, and muscle mass   HEENT neck supple with midline trachea   Mouth/Teeth mucous membranes moist, pharynx normal without lesions   Neck supple   Cardiovascular: regular rate and rhythm   Respiratory:  appears well, vitals normal, no respiratory distress, acyanotic, normal RR, ear and throat exam is normal, neck free of mass or lymphadenopathy, chest clear, no wheezing, crepitations, rhonchi, normal symmetric air entry   Abdomen: soft, non-tender; bowel sounds normal; no masses,  no organomegaly   Urinary: urethral meatus normal      Assessment:    Pregnancy: G2P1001 Patient Active Problem List   Diagnosis Date Noted  . Supervision of other normal pregnancy, antepartum 08/20/2020  . Pelvic pain 07/26/2018  . Adrenal adenoma, left 01/11/2018  . Depression 08/18/2014        Plan:  Initial labs drawn. Continue prenatal vitamins. Discussed and offered genetic screening options, including Quad screen/AFP, NIPS testing, and option to decline testing. Benefits/risks/alternatives reviewed. Pt aware that anatomy US is form of genetic screening with lower accuracy in detecting trisomies than blood work.  Pt chooses genetic screening today. NIPS: ordered. Ultrasound discussed; fetal anatomic  survey: ordered. Problem list reviewed and updated. The nature of [facility] with multiple MDs and other Advanced Practice Providers was explained to patient; also emphasized that residents, students are part of our team. Routine obstetric precautions reviewed.  Discussed baby ASA at 12 wks for reduction of risk for preeclampsia Baseline labs done   Ultrasound discussed; fetal survey: requested.  Follow up in 4 weeks. 50% of 30 min visit spent on counseling and coordination of care.      Hansel Feinstein 08/20/2020

## 2020-08-21 LAB — PROTEIN / CREATININE RATIO, URINE
Creatinine, Urine: 42.5 mg/dL
Protein, Ur: 4.7 mg/dL
Protein/Creat Ratio: 111 mg/g creat (ref 0–200)

## 2020-08-22 LAB — CBC/D/PLT+RPR+RH+ABO+RUB AB...
Antibody Screen: NEGATIVE
Basophils Absolute: 0 10*3/uL (ref 0.0–0.2)
Basos: 0 %
EOS (ABSOLUTE): 0.1 10*3/uL (ref 0.0–0.4)
Eos: 1 %
HCV Ab: <0.1 s/co ratio (ref 0.0–0.9)
HIV Screen 4th Generation wRfx: NONREACTIVE
Hematocrit: 36.6 % (ref 34.0–46.6)
Hemoglobin: 12.4 g/dL (ref 11.1–15.9)
Hepatitis B Surface Ag: NEGATIVE
Immature Grans (Abs): 0 10*3/uL (ref 0.0–0.1)
Immature Granulocytes: 0 %
Lymphocytes Absolute: 2.2 10*3/uL (ref 0.7–3.1)
Lymphs: 20 %
MCH: 30.2 pg (ref 26.6–33.0)
MCHC: 33.9 g/dL (ref 31.5–35.7)
MCV: 89 fL (ref 79–97)
Monocytes Absolute: 0.5 10*3/uL (ref 0.1–0.9)
Monocytes: 4 %
Neutrophils Absolute: 8.1 10*3/uL — ABNORMAL HIGH (ref 1.4–7.0)
Neutrophils: 75 %
Platelets: 377 10*3/uL (ref 150–450)
RBC: 4.11 x10E6/uL (ref 3.77–5.28)
RDW: 12.2 % (ref 11.7–15.4)
RPR Ser Ql: NONREACTIVE
Rh Factor: POSITIVE
Rubella Antibodies, IGG: <0.9 index — ABNORMAL LOW (ref >0.99)
WBC: 10.9 10*3/uL — ABNORMAL HIGH (ref 3.4–10.8)

## 2020-08-22 LAB — COMPREHENSIVE METABOLIC PANEL
ALT: 18 IU/L (ref 0–32)
AST: 10 IU/L (ref 0–40)
Albumin/Globulin Ratio: 1.8 (ref 1.2–2.2)
Albumin: 4.4 g/dL (ref 3.9–5.0)
Alkaline Phosphatase: 53 IU/L (ref 44–121)
BUN/Creatinine Ratio: 12 (ref 9–23)
BUN: 6 mg/dL (ref 6–20)
Bilirubin Total: 0.3 mg/dL (ref 0.0–1.2)
CO2: 19 mmol/L — ABNORMAL LOW (ref 20–29)
Calcium: 9.2 mg/dL (ref 8.7–10.2)
Chloride: 105 mmol/L (ref 96–106)
Creatinine, Ser: 0.49 mg/dL — ABNORMAL LOW (ref 0.57–1.00)
GFR calc Af Amer: 154 mL/min/{1.73_m2} (ref >59)
GFR calc non Af Amer: 134 mL/min/{1.73_m2} (ref >59)
Globulin, Total: 2.4 g/dL (ref 1.5–4.5)
Glucose: 89 mg/dL (ref 65–99)
Potassium: 4.4 mmol/L (ref 3.5–5.2)
Sodium: 139 mmol/L (ref 134–144)
Total Protein: 6.8 g/dL (ref 6.0–8.5)

## 2020-08-22 LAB — URINE CULTURE, OB REFLEX: Organism ID, Bacteria: NO GROWTH

## 2020-08-22 LAB — HEMOGLOBPATHY+FER W/A THAL RFX
Ferritin: 50 ng/mL (ref 15–150)
Hgb A2: 2.5 % (ref 1.8–3.2)
Hgb A: 97.5 % (ref 96.4–98.8)
Hgb F: 0 % (ref 0.0–2.0)
Hgb S: 0 %

## 2020-08-22 LAB — HEMOGLOBIN A1C
Est. average glucose Bld gHb Est-mCnc: 105 mg/dL
Hgb A1c MFr Bld: 5.3 % (ref 4.8–5.6)

## 2020-08-22 LAB — CULTURE, OB URINE

## 2020-08-22 LAB — HCV INTERPRETATION

## 2020-09-11 ENCOUNTER — Ambulatory Visit (INDEPENDENT_AMBULATORY_CARE_PROVIDER_SITE_OTHER): Payer: 59 | Admitting: Family Medicine

## 2020-09-11 ENCOUNTER — Other Ambulatory Visit: Payer: Self-pay

## 2020-09-11 VITALS — BP 109/71 | HR 102 | Wt 190.0 lb

## 2020-09-11 DIAGNOSIS — O4690 Antepartum hemorrhage, unspecified, unspecified trimester: Secondary | ICD-10-CM

## 2020-09-11 DIAGNOSIS — Z3480 Encounter for supervision of other normal pregnancy, unspecified trimester: Secondary | ICD-10-CM

## 2020-09-11 DIAGNOSIS — O09299 Supervision of pregnancy with other poor reproductive or obstetric history, unspecified trimester: Secondary | ICD-10-CM

## 2020-09-11 NOTE — Progress Notes (Signed)
° °  PRENATAL VISIT NOTE  Subjective:  Diana Fuentes is a 29 y.o. G2P1001 at [redacted]w[redacted]d being seen today for ongoing prenatal care.  She is currently monitored for the following issues for this low-risk pregnancy and has Pelvic pain; Supervision of other normal pregnancy, antepartum; Adrenal adenoma, left; Depression; and Hx of preeclampsia, prior pregnancy, currently pregnant on their problem list.  Patient reports bleeding. Had pink bleeding for 2-3 days - small amount. None now. Contractions: Not present. Vag. Bleeding: Moderate.  Movement: Absent. Denies leaking of fluid.   The following portions of the patient's history were reviewed and updated as appropriate: allergies, current medications, past family history, past medical history, past social history, past surgical history and problem list.   Objective:   Vitals:   09/11/20 1349  BP: 109/71  Pulse: (!) 102  Weight: 190 lb (86.2 kg)    Fetal Status: Fetal Heart Rate (bpm): 164   Movement: Absent     General:  Alert, oriented and cooperative. Patient is in no acute distress.  Skin: Skin is warm and dry. No rash noted.   Cardiovascular: Normal heart rate noted  Respiratory: Normal respiratory effort, no problems with respiration noted  Abdomen: Soft, gravid, appropriate for gestational age.  Pain/Pressure: Absent     Pelvic: Cervical exam performed in the presence of a chaperoneSmall 44m clot removed from cervix. No active bleeding        Extremities: Normal range of motion.  Edema: None  Mental Status: Normal mood and affect. Normal behavior. Normal judgment and thought content.   Assessment and Plan:  Pregnancy: G2P1001 at [redacted]w[redacted]d 1. Supervision of other normal pregnancy, antepartum  2. Hx of preeclampsia, prior pregnancy, currently pregnant On ASA 81mg   3. Vaginal bleeding during pregnancy Pelvic rest. Precautions given. Reassuring FHT.  Preterm labor symptoms and general obstetric precautions including but not limited to  vaginal bleeding, contractions, leaking of fluid and fetal movement were reviewed in detail with the patient. Please refer to After Visit Summary for other counseling recommendations.   No follow-ups on file.  Future Appointments  Date Time Provider Department Center  10/10/2020 10:00 AM 10/12/2020, DO [facility] None    Levie Heritage, DO

## 2020-09-14 DIAGNOSIS — Z419 Encounter for procedure for purposes other than remedying health state, unspecified: Secondary | ICD-10-CM | POA: Diagnosis not present

## 2020-09-14 NOTE — L&D Delivery Note (Addendum)
Outlet Vacuum Assisted Vaginal Delivery Indication for operative vaginal delivery: Non reassuring fetal heart tones  Patient was examined and found to be fully dilated with fetal station of +2.  Patient's bladder was noted to be empty, and there were no known fetal contraindications to operative vaginal delivery. FHR tracing remarkable for recurrent deep variable decelerations. Dr. Ilda Basset called to bedside. SVE showed +4, direct OA  Risks of vacuum assistance were discussed in detail, including but not limited to, bleeding, infection, damage to maternal tissues, fetal cephalohematoma, inability to effect vaginal delivery of the head or shoulder dystocia that cannot be resolved by established maneuvers and need for emergency cesarean section.  Patient gave verbal consent. Peds team had already been called to the room.   The Kiwi sponge cup was positioned at the flexion point and the suction to the green zone, and the patient was instructed to push.  Pulling was administered along the pelvic curve while patient was pushing; there were 3 contractions and 1 popoffs.  Vacuum was reduced in between contractions.  The infant was then delivered atraumatically, noted to be a viable female infant, Apgars of 8 and 9, weight 3705gm.  No nuchal cord noted at delivery. Neonatology present for delivery.  There was spontaneous placental delivery, intact with three-vessel cord.  Bilateral labial laceration noted, hemostatic. EBL 232mL.   Sponge, instrument and needle counts were correct x 2.  The patient and baby were stable after delivery and remained in couplet care, with plans to transfer later to postpartum unit.  Diana Senate, MD OB Fellow, [place] for [place] 03/27/2021 9:18 PM  Agree with above. I was present and scrubbed for the entire procedure.   Diana Romans MD Attending Center for [employer] farm manager)

## 2020-09-19 ENCOUNTER — Encounter: Payer: 59 | Admitting: Family Medicine

## 2020-09-22 ENCOUNTER — Other Ambulatory Visit: Payer: Self-pay

## 2020-09-22 ENCOUNTER — Emergency Department (HOSPITAL_BASED_OUTPATIENT_CLINIC_OR_DEPARTMENT_OTHER)
Admission: EM | Admit: 2020-09-22 | Discharge: 2020-09-22 | Disposition: A | Discharge status: Home / Self Care | Payer: 59 | Attending: Emergency Medicine | Admitting: Emergency Medicine

## 2020-09-22 ENCOUNTER — Encounter (HOSPITAL_BASED_OUTPATIENT_CLINIC_OR_DEPARTMENT_OTHER): Payer: Self-pay

## 2020-09-22 DIAGNOSIS — Z3A14 14 weeks gestation of pregnancy: Secondary | ICD-10-CM | POA: Diagnosis not present

## 2020-09-22 DIAGNOSIS — Z87891 Personal history of nicotine dependence: Secondary | ICD-10-CM | POA: Insufficient documentation

## 2020-09-22 DIAGNOSIS — R112 Nausea with vomiting, unspecified: Secondary | ICD-10-CM

## 2020-09-22 DIAGNOSIS — O219 Vomiting of pregnancy, unspecified: Secondary | ICD-10-CM | POA: Diagnosis not present

## 2020-09-22 DIAGNOSIS — O99612 Diseases of the digestive system complicating pregnancy, second trimester: Secondary | ICD-10-CM | POA: Diagnosis not present

## 2020-09-22 DIAGNOSIS — O99282 Endocrine, nutritional and metabolic diseases complicating pregnancy, second trimester: Secondary | ICD-10-CM | POA: Insufficient documentation

## 2020-09-22 DIAGNOSIS — E860 Dehydration: Secondary | ICD-10-CM

## 2020-09-22 LAB — CBC WITH DIFFERENTIAL/PLATELET
Abs Immature Granulocytes: 0.09 10*3/uL — ABNORMAL HIGH (ref 0.00–0.07)
Basophils Absolute: 0 10*3/uL (ref 0.0–0.1)
Basophils Relative: 0 %
Eosinophils Absolute: 0 10*3/uL (ref 0.0–0.5)
Eosinophils Relative: 0 %
HCT: 34.8 % — ABNORMAL LOW (ref 36.0–46.0)
Hemoglobin: 12 g/dL (ref 12.0–15.0)
Immature Granulocytes: 1 %
Lymphocytes Relative: 6 %
Lymphs Abs: 1.1 10*3/uL (ref 0.7–4.0)
MCH: 30.5 pg (ref 26.0–34.0)
MCHC: 34.5 g/dL (ref 30.0–36.0)
MCV: 88.5 fL (ref 80.0–100.0)
Monocytes Absolute: 0.5 10*3/uL (ref 0.1–1.0)
Monocytes Relative: 3 %
Neutro Abs: 16.5 10*3/uL — ABNORMAL HIGH (ref 1.7–7.7)
Neutrophils Relative %: 90 %
Platelets: 351 10*3/uL (ref 150–400)
RBC: 3.93 MIL/uL (ref 3.87–5.11)
RDW: 12.2 % (ref 11.5–15.5)
WBC: 18.2 10*3/uL — ABNORMAL HIGH (ref 4.0–10.5)
nRBC: 0 % (ref 0.0–0.2)

## 2020-09-22 LAB — COMPREHENSIVE METABOLIC PANEL
ALT: 16 U/L (ref 0–44)
AST: 16 U/L (ref 15–41)
Albumin: 3.8 g/dL (ref 3.5–5.0)
Alkaline Phosphatase: 41 U/L (ref 38–126)
Anion gap: 11 (ref 5–15)
BUN: 9 mg/dL (ref 6–20)
CO2: 22 mmol/L (ref 22–32)
Calcium: 8.9 mg/dL (ref 8.9–10.3)
Chloride: 102 mmol/L (ref 98–111)
Creatinine, Ser: 0.46 mg/dL (ref 0.44–1.00)
GFR, Estimated: >60 mL/min (ref >60)
Glucose, Bld: 125 mg/dL — ABNORMAL HIGH (ref 70–99)
Potassium: 3.2 mmol/L — ABNORMAL LOW (ref 3.5–5.1)
Sodium: 135 mmol/L (ref 135–145)
Total Bilirubin: 0.4 mg/dL (ref 0.3–1.2)
Total Protein: 7.2 g/dL (ref 6.5–8.1)

## 2020-09-22 LAB — URINALYSIS, ROUTINE W REFLEX MICROSCOPIC
Bilirubin Urine: NEGATIVE
Glucose, UA: NEGATIVE mg/dL
Hgb urine dipstick: NEGATIVE
Ketones, ur: >=80 mg/dL — AB
Leukocytes,Ua: NEGATIVE
Nitrite: NEGATIVE
Protein, ur: NEGATIVE mg/dL
Specific Gravity, Urine: 1.025 (ref 1.005–1.030)
pH: 6 (ref 5.0–8.0)

## 2020-09-22 MED ORDER — SODIUM CHLORIDE 0.9 % IV BOLUS
1000.0000 mL | Freq: Once | INTRAVENOUS | Status: AC
  Administered 2020-09-22: 1000 mL via INTRAVENOUS

## 2020-09-22 MED ORDER — POTASSIUM CHLORIDE CRYS ER 20 MEQ PO TBCR: 30.0000 meq | EXTENDED_RELEASE_TABLET | Freq: Once | ORAL | Status: DC

## 2020-09-22 MED ORDER — POTASSIUM CHLORIDE CRYS ER 20 MEQ PO TBCR
EXTENDED_RELEASE_TABLET | ORAL | Status: AC
  Filled 2020-09-22: qty 2

## 2020-09-22 MED ORDER — ONDANSETRON HCL 4 MG/2ML IJ SOLN
4.0000 mg | Freq: Once | INTRAMUSCULAR | Status: AC
  Administered 2020-09-22: 4 mg via INTRAVENOUS
  Filled 2020-09-22: qty 2

## 2020-09-22 MED ORDER — METOCLOPRAMIDE HCL 5 MG/ML IJ SOLN
10.0000 mg | Freq: Once | INTRAMUSCULAR | Status: AC
  Administered 2020-09-22: 10 mg via INTRAVENOUS
  Filled 2020-09-22: qty 2

## 2020-09-22 MED ORDER — DIPHENHYDRAMINE HCL 50 MG/ML IJ SOLN
25.0000 mg | Freq: Once | INTRAMUSCULAR | Status: AC
  Administered 2020-09-22: 25 mg via INTRAVENOUS
  Filled 2020-09-22: qty 1

## 2020-09-22 NOTE — ED Notes (Addendum)
Pt reports throat pain, burning  Pain. Denies difficulty swallowing, no s/s of distress. Pt ambulatory with steady gait to restroom to provide urine specimen

## 2020-09-22 NOTE — ED Triage Notes (Signed)
Pt arrives ambulatory to ED with c/o vomiting X2 days after something she ate. Pt is [redacted] weeks pregnant.

## 2020-09-22 NOTE — Discharge Instructions (Addendum)
You have been seen for nausea and vomiting, this may be due to food poisoning, viral and less likely bacterial infection.  You may take unisom (doxylamine) over the counter at night for nausea, or take the zofran previously prescribed if you have continued nausea and vomiting from this.  I recommend COVID 19 testing as this may be the etiology of your symptoms.

## 2020-09-22 NOTE — ED Notes (Signed)
Pt reports emesis episode. EDP Schlossman notified

## 2020-09-22 NOTE — ED Notes (Signed)
Pt given water for po challenge.

## 2020-09-22 NOTE — ED Notes (Signed)
ED Provider at bedside. 

## 2020-09-22 NOTE — ED Notes (Signed)
Hold on K+ until pt not vomiting

## 2020-09-22 NOTE — ED Provider Notes (Signed)
[facility] EMERGENCY DEPARTMENT Provider Note   CSN: [id number] Arrival date & time: 09/22/20  1324     History Chief Complaint  Patient presents with  . Emesis    Diana Fuentes is a 30 y.o. female.  HPI     Night before last night had pizza, woke up in the middle of the night to throw up Had diarrhea and continued vomiting everything tried to drink Even when not throwing up has constant nausea like going to Would drink water to throw up to make feeling go away Last night was going to come but wanted to waqit and see and had continuing vomiting through the night Vomited more than 10 times, yesterday a little bit of blood, dark brown color, water, spit, stomach acid Started to feel like acid reflux Just had one episode of diarrhea No other symptoms, feel cold after emesis No other fever, chills, body aches, sore throat, cough, shortness of breath, no loss of taste or smell, No dysuria or urinary symptoms.  No vaqinal bleeding.  Small amt of discharge, more than usual, no other itching or other symptoms.  G2P1001 at 14wk early [place] Dr. Burnett Corrente  Has not had COVID vaccine No known sick contacts  Past Medical History:  Diagnosis Date  . Adrenal adenoma   . Anxiety   . Chlamydia   . Depression   . Ear infection     Patient Active Problem List   Diagnosis Date Noted  . Supervision of other normal pregnancy, antepartum 08/20/2020  . Hx of preeclampsia, prior pregnancy, currently pregnant 08/20/2020  . Pelvic pain 07/26/2018  . Adrenal adenoma, left 01/11/2018  . Depression 08/18/2014    Past Surgical History:  Procedure Laterality Date  . LAPAROSCOPIC LYSIS OF ADHESIONS N/A 11/02/2018   Procedure: LAPAROSCOPIC LYSIS OF ADHESIONS;  Surgeon: Allie Bossier, MD;  Location: [facility];  Service: Gynecology;  Laterality: N/A;  . LAPAROSCOPY N/A 11/02/2018   Procedure: LAPAROSCOPY DIAGNOSTIC;  Surgeon: Allie Bossier, MD;  [facility];  Service: Gynecology;  Laterality: N/A;  . WISDOM TOOTH EXTRACTION       OB History    Gravida  2   Para  1   Term  1   Preterm      AB      Living  1     SAB      IAB      Ectopic      Multiple      Live Births  1           Family History  Problem Relation Age of Onset  . Diabetes Maternal Aunt   . Diabetes Maternal Uncle   . Diabetes Maternal Grandmother     Social History   Tobacco Use  . Smoking status: Former Games developer  . Smokeless tobacco: Current User  . Tobacco comment: vape  Vaping Use  . Vaping Use: Every day  . Substances: Flavoring  Substance Use Topics  . Alcohol use: Not Currently    Comment: occ  . Drug use: Not Currently    Types: Marijuana    Home Medications Prior to Admission medications   Medication Sig Start Date End Date Taking? Authorizing Provider  fluconazole (DIFLUCAN) 150 MG tablet Take 1 tablet by mouth. Repeat in 3 days if symptoms persists. Patient not taking: Reported on 08/20/2020 03/05/20   Levie Heritage, DO  metroNIDAZOLE (FLAGYL) 500 MG tablet Take 1 tablet (500 mg  total) by mouth 2 (two) times daily. Patient not taking: Reported on 08/20/2020 03/05/20   Levie Heritage, DO  Prenatal Vit-Fe Fumarate-FA (PRENATAL VITAMINS PO) Take by mouth.    [provider]  norgestrel-ethinyl estradiol (LO/OVRAL) 0.3-30 MG-MCG tablet Take 1 tablet by mouth daily. Patient not taking: Reported on 12/14/2019 09/14/19 12/16/19  Allie Bossier, MD    Allergies    Shellfish allergy  Review of Systems   Review of Systems  Constitutional: Positive for fatigue. Negative for fever.  HENT: Negative for congestion.   Respiratory: Negative for cough and shortness of breath.   Cardiovascular: Negative for chest pain.  Gastrointestinal: Positive for diarrhea, nausea and vomiting. Negative for abdominal pain and constipation.  Genitourinary: Negative for dysuria.  Musculoskeletal: Negative for back pain.  Skin:  Negative for rash.  Neurological: Negative for headaches.    Physical Exam Updated Vital Signs BP 123/72 (BP Location: Right Arm)   Pulse 76   Temp 99.1 F (37.3 C) (Oral)   Resp 18   Ht 5\' 5"  (1.651 m)   Wt 86.2 kg   LMP 06/14/2020   SpO2 99%   BMI 31.62 kg/m   Physical Exam Vitals and nursing note reviewed.  Constitutional:      General: She is not in acute distress.    Appearance: Normal appearance. She is not ill-appearing, toxic-appearing or diaphoretic.  HENT:     Head: Normocephalic.  Eyes:     Conjunctiva/sclera: Conjunctivae normal.  Cardiovascular:     Rate and Rhythm: Normal rate and regular rhythm.     Pulses: Normal pulses.  Pulmonary:     Effort: Pulmonary effort is normal. No respiratory distress.  Musculoskeletal:        General: No deformity or signs of injury.     Cervical back: No rigidity.  Skin:    General: Skin is warm and dry.     Coloration: Skin is not jaundiced or pale.  Neurological:     General: No focal deficit present.     Mental Status: She is alert and oriented to person, place, and time.     ED Results / Procedures / Treatments   Labs (all labs ordered are listed, but only abnormal results are displayed) Labs Reviewed  CBC WITH DIFFERENTIAL/PLATELET - Abnormal; Notable for the following components:      Result Value   WBC 18.2 (*)    HCT 34.8 (*)    Neutro Abs 16.5 (*)    Abs Immature Granulocytes 0.09 (*)    All other components within normal limits  COMPREHENSIVE METABOLIC PANEL - Abnormal; Notable for the following components:   Potassium 3.2 (*)    Glucose, Bld 125 (*)    All other components within normal limits  URINALYSIS, ROUTINE W REFLEX MICROSCOPIC - Abnormal; Notable for the following components:   APPearance HAZY (*)    Ketones, ur >=80 (*)    All other components within normal limits  URINE CULTURE    EKG None  Radiology No results found.  Procedures Procedures (including critical care  time)  Medications Ordered in ED Medications  sodium chloride 0.9 % bolus 1,000 mL ( Intravenous Stopped 09/22/20 0930)  ondansetron (ZOFRAN) injection 4 mg (4 mg Intravenous Given 09/22/20 0935)  metoCLOPramide (REGLAN) injection 10 mg (10 mg Intravenous Given 09/22/20 1413)  diphenhydrAMINE (BENADRYL) injection 25 mg (25 mg Intravenous Given 09/22/20 1413)    ED Course  I have reviewed the triage vital signs and the nursing notes.  Pertinent labs & imaging results that were available during my care of the patient were reviewed by me and considered in my medical decision making (see chart for details).    MDM Rules/Calculators/A&P                          29yo G2P1001 at 14wk early [place] presents with concern for nausea and vomiting.  Benign abdominal exam, have low suspicion for appendicitis, cholecystitis, small bowel obstruction.  Labs obtained showed no significant electrolyte abnormalities.  Has mild hypokalemia, oral potassium ordered.  Has leukocytosis.  Consider possible food poisoning, viral or bacterial etiology of nausea and vomiting, including possibility of COVID-19.  Patient declines testing.  Urinalysis shows no signs of urinary tract infection, but is consistent with dehydration with urine ketones present.  Was given fluids, Zofran with some improvement of nausea, however continued to vomit after p.o. challenge.  Was given Reglan IV with plan for po fluids.  She told nursing staff that she was feeling improved and left the emergency department after receiving reglan-and before my re-evaluation.  I recommend continued follow-up with her OB/GYN and COVID-19 testing, return for worsening, unisom for nausea.    Final Clinical Impression(s) / ED Diagnoses Final diagnoses:  Nausea and vomiting, intractability of vomiting not specified, unspecified vomiting type  Dehydration    Rx / DC Orders ED Discharge Orders    None       Alvira Monday, MD 09/23/20 [phone]

## 2020-09-22 NOTE — ED Notes (Signed)
Pt still refused Covid Swab, stressed the importance of knowing status. Reinforced washing hands, social distancing, and wearing mask. Reinforced that patient contact her OB MD as well that she required a visit to the ED today due to N/V. Opportunity for questions provided. Copy of AVS provided as well

## 2020-09-22 NOTE — ED Notes (Signed)
Pt aware urine specimen ordered. Pt reports inability to provide specimen at this time. Specimen collection device provided to patient. 

## 2020-09-22 NOTE — ED Notes (Signed)
Pt reports taking Zofran at home, unable to keep it down, reports vomiting it up immediately after taking.

## 2020-09-22 NOTE — ED Notes (Signed)
Pt refused covid swab. This RN explained that this is most accurate testing and the symptoms she is experiencing ha been consistent with Covid is some patients. EDP Schlossman updated.

## 2020-09-23 LAB — URINE CULTURE: Culture: NO GROWTH

## 2020-09-24 ENCOUNTER — Emergency Department (HOSPITAL_BASED_OUTPATIENT_CLINIC_OR_DEPARTMENT_OTHER)
Admission: EM | Admit: 2020-09-24 | Discharge: 2020-09-24 | Disposition: A | Discharge status: Home / Self Care | Payer: 59 | Attending: Emergency Medicine | Admitting: Emergency Medicine

## 2020-09-24 ENCOUNTER — Other Ambulatory Visit: Payer: Self-pay

## 2020-09-24 ENCOUNTER — Encounter (HOSPITAL_BASED_OUTPATIENT_CLINIC_OR_DEPARTMENT_OTHER): Payer: Self-pay | Admitting: Emergency Medicine

## 2020-09-24 DIAGNOSIS — O219 Vomiting of pregnancy, unspecified: Secondary | ICD-10-CM | POA: Insufficient documentation

## 2020-09-24 DIAGNOSIS — Z3A22 22 weeks gestation of pregnancy: Secondary | ICD-10-CM | POA: Insufficient documentation

## 2020-09-24 DIAGNOSIS — Z87891 Personal history of nicotine dependence: Secondary | ICD-10-CM | POA: Diagnosis not present

## 2020-09-24 LAB — CBC WITH DIFFERENTIAL/PLATELET
Abs Immature Granulocytes: 0.07 10*3/uL (ref 0.00–0.07)
Basophils Absolute: 0 10*3/uL (ref 0.0–0.1)
Basophils Relative: 0 %
Eosinophils Absolute: 0 10*3/uL (ref 0.0–0.5)
Eosinophils Relative: 0 %
HCT: 36.4 % (ref 36.0–46.0)
Hemoglobin: 12.4 g/dL (ref 12.0–15.0)
Immature Granulocytes: 1 %
Lymphocytes Relative: 8 %
Lymphs Abs: 1.1 10*3/uL (ref 0.7–4.0)
MCH: 30.8 pg (ref 26.0–34.0)
MCHC: 34.1 g/dL (ref 30.0–36.0)
MCV: 90.3 fL (ref 80.0–100.0)
Monocytes Absolute: 0.4 10*3/uL (ref 0.1–1.0)
Monocytes Relative: 3 %
Neutro Abs: 12.8 10*3/uL — ABNORMAL HIGH (ref 1.7–7.7)
Neutrophils Relative %: 88 %
Platelets: 354 10*3/uL (ref 150–400)
RBC: 4.03 MIL/uL (ref 3.87–5.11)
RDW: 12.2 % (ref 11.5–15.5)
WBC: 14.4 10*3/uL — ABNORMAL HIGH (ref 4.0–10.5)
nRBC: 0 % (ref 0.0–0.2)

## 2020-09-24 LAB — COMPREHENSIVE METABOLIC PANEL
ALT: 24 U/L (ref 0–44)
AST: 18 U/L (ref 15–41)
Albumin: 4 g/dL (ref 3.5–5.0)
Alkaline Phosphatase: 42 U/L (ref 38–126)
Anion gap: 13 (ref 5–15)
BUN: 7 mg/dL (ref 6–20)
CO2: 20 mmol/L — ABNORMAL LOW (ref 22–32)
Calcium: 8.9 mg/dL (ref 8.9–10.3)
Chloride: 103 mmol/L (ref 98–111)
Creatinine, Ser: 0.48 mg/dL (ref 0.44–1.00)
GFR, Estimated: >60 mL/min (ref >60)
Glucose, Bld: 115 mg/dL — ABNORMAL HIGH (ref 70–99)
Potassium: 3.4 mmol/L — ABNORMAL LOW (ref 3.5–5.1)
Sodium: 136 mmol/L (ref 135–145)
Total Bilirubin: 0.8 mg/dL (ref 0.3–1.2)
Total Protein: 7.5 g/dL (ref 6.5–8.1)

## 2020-09-24 LAB — URINALYSIS, ROUTINE W REFLEX MICROSCOPIC
Glucose, UA: NEGATIVE mg/dL
Hgb urine dipstick: NEGATIVE
Ketones, ur: >=80 mg/dL — AB
Leukocytes,Ua: NEGATIVE
Nitrite: NEGATIVE
Protein, ur: NEGATIVE mg/dL
Specific Gravity, Urine: 1.015 (ref 1.005–1.030)
pH: 8.5 — ABNORMAL HIGH (ref 5.0–8.0)

## 2020-09-24 LAB — LIPASE, BLOOD: Lipase: 25 U/L (ref 11–51)

## 2020-09-24 MED ORDER — PROMETHAZINE HCL 25 MG PO TABS: 25.0000 mg | ORAL_TABLET | Freq: Four times a day (QID) | ORAL | 0 refills | Status: DC | PRN

## 2020-09-24 MED ORDER — ONDANSETRON HCL 4 MG/2ML IJ SOLN
4.0000 mg | Freq: Once | INTRAMUSCULAR | Status: AC
  Administered 2020-09-24: 4 mg via INTRAVENOUS
  Filled 2020-09-24: qty 2

## 2020-09-24 MED ORDER — METOCLOPRAMIDE HCL 5 MG/ML IJ SOLN
10.0000 mg | Freq: Once | INTRAMUSCULAR | Status: AC
  Administered 2020-09-24: 10 mg via INTRAVENOUS
  Filled 2020-09-24: qty 2

## 2020-09-24 MED ORDER — ALUM & MAG HYDROXIDE-SIMETH 200-200-20 MG/5ML PO SUSP
30.0000 mL | Freq: Once | ORAL | Status: AC
  Administered 2020-09-24: 30 mL via ORAL
  Filled 2020-09-24: qty 30

## 2020-09-24 MED ORDER — SODIUM CHLORIDE 0.9 % IV BOLUS
1000.0000 mL | Freq: Once | INTRAVENOUS | Status: AC
  Administered 2020-09-24: 1000 mL via INTRAVENOUS

## 2020-09-24 MED ORDER — PROMETHAZINE HCL 25 MG/ML IJ SOLN
25.0000 mg | Freq: Once | INTRAMUSCULAR | Status: AC
  Administered 2020-09-24: 25 mg via INTRAMUSCULAR
  Filled 2020-09-24: qty 1

## 2020-09-24 NOTE — ED Notes (Signed)
Pt [redacted] weeks pregnant with vomiting and nausea since Friday

## 2020-09-24 NOTE — ED Notes (Signed)
Pt continues with episodes of vomiting

## 2020-09-24 NOTE — Discharge Instructions (Addendum)
Drink clear liquids until your stomach feels better. Eat small frequent meals throughout the day. You can take the Phenergan every 6 hours as needed for nausea. The OB office should be contacting you to schedule close follow-up appointment.  If you not heard from them the next day or 2, reach out. If you have recurrent or persisting symptoms, please go to the maternity assessment unit at [facility]. ENTRANCE C

## 2020-09-24 NOTE — ED Provider Notes (Signed)
[facility] EMERGENCY DEPARTMENT Provider Note   CSN: [id number] Arrival date & time: 09/24/20  0947     History Chief Complaint  Patient presents with  . Emesis During Pregnancy    Diana Fuentes is a 30 y.o. female G2P1001, 14 weeks 4 days gestation, presenting to the ED for nausea and vomiting.  She was seen in the ED on Sunday for similar symptoms, and eloped prior to discharge with improved symptoms.  She states symptoms began on Saturday.  She has had nausea and vomiting with epigastric abdominal discomfort and some burning pain.  She states she has been treating her symptoms with Zofran at home, which helped yesterday and she did not have any vomiting yesterday and was able to keep fluids down, however symptoms began again this morning.  She has run out of her Zofran.  She states she is to have recurrent episodes similar to this last year and had no formal diagnosis.  She states she had typical morning sickness throughout her first trimester of this pregnancy, however symptoms over the last few days are much different.  This feels similar to prior episodes last year.  She endorses history of marijuana use, last used about 2 to 3 months ago.  She states she used to use marijuana very frequently.  No recent alcohol use.  Alcohol seemed to exacerbate her symptoms in the past as well.  Denies history of GERD.  No urinary symptoms, fever, diarrhea he contacted her OB office this morning, followed by Dr. Nehemiah Settle.  However, she was told they would send her a MyChart message for follow-up however they did not do so therefore she came to the ED.  She contacted them around 8 AM.  Denies vaginal bleeding, vaginal discharge, leakage of fluids or lower abdominal pain.  The history is provided by the patient and medical records.       Past Medical History:  Diagnosis Date  . Adrenal adenoma   . Anxiety   . Chlamydia   . Depression   . Ear infection     Patient Active Problem List    Diagnosis Date Noted  . Supervision of other normal pregnancy, antepartum 08/20/2020  . Hx of preeclampsia, prior pregnancy, currently pregnant 08/20/2020  . Pelvic pain 07/26/2018  . Adrenal adenoma, left 01/11/2018  . Depression 08/18/2014    Past Surgical History:  Procedure Laterality Date  . LAPAROSCOPIC LYSIS OF ADHESIONS N/A 11/02/2018   Procedure: LAPAROSCOPIC LYSIS OF ADHESIONS;  Surgeon: Emily Filbert, MD;  Location: [facility];  Service: Gynecology;  Laterality: N/A;  . LAPAROSCOPY N/A 11/02/2018   Procedure: LAPAROSCOPY DIAGNOSTIC;  Surgeon: Emily Filbert, MD;  Location: [facility];  Service: Gynecology;  Laterality: N/A;  . WISDOM TOOTH EXTRACTION       OB History    Gravida  2   Para  1   Term  1   Preterm      AB      Living  1     SAB      IAB      Ectopic      Multiple      Live Births  1           Family History  Problem Relation Age of Onset  . Diabetes Maternal Aunt   . Diabetes Maternal Uncle   . Diabetes Maternal Grandmother     Social History   Tobacco Use  . Smoking status: Former Research scientist (life sciences)  .  Smokeless tobacco: Current User  . Tobacco comment: vape  Vaping Use  . Vaping Use: Every day  . Substances: Flavoring  Substance Use Topics  . Alcohol use: Not Currently    Comment: occ  . Drug use: Not Currently    Types: Marijuana    Home Medications Prior to Admission medications   Medication Sig Start Date End Date Taking? Authorizing Provider  promethazine (PHENERGAN) 25 MG tablet Take 1 tablet (25 mg total) by mouth every 6 (six) hours as needed for nausea or vomiting. 09/24/20  Yes Starr Urias, [place] N, PA-C  fluconazole (DIFLUCAN) 150 MG tablet Take 1 tablet by mouth. Repeat in 3 days if symptoms persists. Patient not taking: No sig reported 03/05/20   Truett Mainland, DO  metroNIDAZOLE (FLAGYL) 500 MG tablet Take 1 tablet (500 mg total) by mouth 2 (two) times daily. Patient not taking: No sig  reported 03/05/20   Truett Mainland, DO  Prenatal Vit-Fe Fumarate-FA (PRENATAL VITAMINS PO) Take by mouth.    [provider]  norgestrel-ethinyl estradiol (LO/OVRAL) 0.3-30 MG-MCG tablet Take 1 tablet by mouth daily. Patient not taking: Reported on 12/14/2019 09/14/19 12/16/19  Emily Filbert, MD    Allergies    Shellfish allergy  Review of Systems   Review of Systems  All other systems reviewed and are negative.   Physical Exam Updated Vital Signs BP 126/70 (BP Location: Right Arm)   Pulse 73   Temp 98.4 F (36.9 C) (Oral)   Resp 18   LMP 06/14/2020   SpO2 100%   Physical Exam Vitals and nursing note reviewed.  Constitutional:      Appearance: She is well-developed and well-nourished.  HENT:     Head: Normocephalic and atraumatic.  Eyes:     Conjunctiva/sclera: Conjunctivae normal.  Cardiovascular:     Rate and Rhythm: Normal rate and regular rhythm.  Pulmonary:     Effort: Pulmonary effort is normal.     Breath sounds: Normal breath sounds.  Abdominal:     General: Bowel sounds are normal.     Palpations: Abdomen is soft.     Tenderness: There is no abdominal tenderness. There is no guarding or rebound.  Skin:    General: Skin is warm.  Neurological:     Mental Status: She is alert.  Psychiatric:        Mood and Affect: Mood and affect normal.        Behavior: Behavior normal.     ED Results / Procedures / Treatments   Labs (all labs ordered are listed, but only abnormal results are displayed) Labs Reviewed  COMPREHENSIVE METABOLIC PANEL - Abnormal; Notable for the following components:      Result Value   Potassium 3.4 (*)    CO2 20 (*)    Glucose, Bld 115 (*)    All other components within normal limits  CBC WITH DIFFERENTIAL/PLATELET - Abnormal; Notable for the following components:   WBC 14.4 (*)    Neutro Abs 12.8 (*)    All other components within normal limits  URINALYSIS, ROUTINE W REFLEX MICROSCOPIC - Abnormal; Notable for the following  components:   APPearance HAZY (*)    pH 8.5 (*)    Bilirubin Urine SMALL (*)    Ketones, ur >=80 (*)    All other components within normal limits  LIPASE, BLOOD    EKG None  Radiology No results found.  Procedures Procedures (including critical care time)  Medications Ordered in ED Medications  ondansetron (ZOFRAN) injection 4 mg (4 mg Intravenous Given 09/24/20 1039)  sodium chloride 0.9 % bolus 1,000 mL (0 mLs Intravenous Stopped 09/24/20 1150)  metoCLOPramide (REGLAN) injection 10 mg (10 mg Intravenous Given 09/24/20 1146)  promethazine (PHENERGAN) injection 25 mg (25 mg Intramuscular Given 09/24/20 1344)  sodium chloride 0.9 % bolus 1,000 mL (0 mLs Intravenous Stopped 09/24/20 1535)  alum & mag hydroxide-simeth (MAALOX/MYLANTA) 200-200-20 MG/5ML suspension 30 mL (30 mLs Oral Given 09/24/20 1454)    ED Course  I have reviewed the triage vital signs and the nursing notes.  Pertinent labs & imaging results that were available during my care of the patient were reviewed by me and considered in my medical decision making (see chart for details).  Clinical Course as of 09/24/20 1559  Tue Sep 24, 2020  1312 Discussed with Dr. Rosana Hoes, OB on call. Recommends additional dose of phenergan and additional liter of fluid. If symptoms not improved call back. If improved, d/c with antiemetic and office to reach out to her for sooner f/u. [JR]    Clinical Course User Index [JR] Jamie-Lee Galdamez, [place] N, PA-C   MDM Rules/Calculators/A&P                          Patient presenting for second visit regarding nausea and vomiting that began on Saturday.  She is G2, P1, 14 weeks 4 days gestation, confirmed with early ultrasound, followed by Dr. Nehemiah Settle.  She states symptoms feel different than typical morning sickness which she had throughout the beginning of this pregnancy and prior pregnancy.  She reports a history of recurrent episodes of nausea and vomiting similar to this and this feels the same.   Used to use marijuana heavily however has not used in last 2 to 3 months.  No recent alcohol use.  She has some epigastric abdominal discomfort described as burning sensation, though no vaginal bleeding, discharge, leakage of fluid or lower abdominal pain.  Abdomen is benign on exam.  Vital signs are stable.  Laboratory work-up ordered.  IV fluids and antiemetic ordered.  Labs with mild leukocytosis of 14.4, improved since 2 days ago.  Metabolic panel without significant abnormality.  Normal LFTs and lipase. UA without protein or signs of infection.  Patient with improvement in symptoms after 1 L of fluid, Zofran and Reglan requesting p.o. challenge.  However patient drank water and began vomiting soon after.  Consulted with OB on-call Dr. Rosana Hoes.  Recommends additional dose of antiemetic, Phenergan and additional liter of fluid.  If symptoms persist can call back to discuss possible transfer.  If improved, recommend discharge.  OB office to reach out to patient for close follow-up  Patient with improvement in symptoms, tolerating p.o. after Phenergan and additional liter of fluid.  She is appropriate for discharge, prescription provided.  Strict return precautions provided, recommend she report to the MAU if symptoms return or worsen.  Final Clinical Impression(s) / ED Diagnoses Final diagnoses:  Nausea and vomiting in pregnancy prior to [redacted] weeks gestation    Rx / DC Orders ED Discharge Orders         Ordered    promethazine (PHENERGAN) 25 MG tablet  Every 6 hours PRN        09/24/20 [place], [place] N, [place] 09/24/20 1559    Truddie Hidden, MD 09/25/20 [phone]

## 2020-09-24 NOTE — ED Notes (Signed)
PA notified that patient drank her water and was unable to keep it down.

## 2020-09-24 NOTE — ED Triage Notes (Signed)
Vomiting. [redacted] weeks pregnant. Seen here Sunday for same.

## 2020-09-24 NOTE — ED Notes (Signed)
ED Provider at bedside. 

## 2020-09-24 NOTE — ED Notes (Signed)
Pt able to tolerate fluids 

## 2020-10-03 ENCOUNTER — Ambulatory Visit (INDEPENDENT_AMBULATORY_CARE_PROVIDER_SITE_OTHER): Payer: 59 | Admitting: Family Medicine

## 2020-10-03 ENCOUNTER — Other Ambulatory Visit: Payer: Self-pay

## 2020-10-03 VITALS — BP 124/69 | HR 88 | Wt 189.0 lb

## 2020-10-03 DIAGNOSIS — Z3480 Encounter for supervision of other normal pregnancy, unspecified trimester: Secondary | ICD-10-CM

## 2020-10-03 DIAGNOSIS — Z3A15 15 weeks gestation of pregnancy: Secondary | ICD-10-CM

## 2020-10-03 DIAGNOSIS — O09299 Supervision of pregnancy with other poor reproductive or obstetric history, unspecified trimester: Secondary | ICD-10-CM

## 2020-10-03 DIAGNOSIS — D3502 Benign neoplasm of left adrenal gland: Secondary | ICD-10-CM

## 2020-10-03 NOTE — Progress Notes (Signed)
Patient has had hard time with nausea and vomiting. Has visited MAU twice in the last two weeks. Kathrene Alu RN

## 2020-10-03 NOTE — Progress Notes (Signed)
   PRENATAL VISIT NOTE  Subjective:  Diana Fuentes is a 30 y.o. G2P1001 at [redacted]w[redacted]d being seen today for ongoing prenatal care.  She is currently monitored for the following issues for this low-risk pregnancy and has Pelvic pain; Supervision of other normal pregnancy, antepartum; Adrenal adenoma, left; Depression; and Hx of preeclampsia, prior pregnancy, currently pregnant on their problem list.  Patient reports seen in ED for vomiting. Improving now. Has phenergan, but not needing it now.  Contractions: Not present. Vag. Bleeding: None.  Movement: Absent. Denies leaking of fluid.   The following portions of the patient's history were reviewed and updated as appropriate: allergies, current medications, past family history, past medical history, past social history, past surgical history and problem list.   Objective:   Vitals:   10/03/20 1124  BP: 124/69  Pulse: 88  Weight: 189 lb (85.7 kg)    Fetal Status: Fetal Heart Rate (bpm): 150   Movement: Absent     General:  Alert, oriented and cooperative. Patient is in no acute distress.  Skin: Skin is warm and dry. No rash noted.   Cardiovascular: Normal heart rate noted  Respiratory: Normal respiratory effort, no problems with respiration noted  Abdomen: Soft, gravid, appropriate for gestational age.  Pain/Pressure: Absent     Pelvic: Cervical exam deferred        Extremities: Normal range of motion.  Edema: None  Mental Status: Normal mood and affect. Normal behavior. Normal judgment and thought content.   Assessment and Plan:  Pregnancy: G2P1001 at [redacted]w[redacted]d 1. [redacted] weeks gestation of pregnancy  2. Supervision of other normal pregnancy, antepartum FHT and FH normal - [place] MFM OB COMP + 14 WK; Future  3. Hx of preeclampsia, prior pregnancy, currently pregnant On ASA 81mg   4. Adrenal adenoma, left Favors benign. No evidence of cushings. Will need follow up after pregnancy  Preterm labor symptoms and general obstetric precautions  including but not limited to vaginal bleeding, contractions, leaking of fluid and fetal movement were reviewed in detail with the patient. Please refer to After Visit Summary for other counseling recommendations.   No follow-ups on file.  Future Appointments  Date Time Provider [place]  10/31/2020  9:45 AM Truett Mainland, DO [facility] None    Truett Mainland, DO

## 2020-10-10 ENCOUNTER — Encounter: Payer: 59 | Admitting: Family Medicine

## 2020-10-15 DIAGNOSIS — Z419 Encounter for procedure for purposes other than remedying health state, unspecified: Secondary | ICD-10-CM | POA: Diagnosis not present

## 2020-10-25 ENCOUNTER — Other Ambulatory Visit: Payer: Self-pay | Admitting: *Deleted

## 2020-10-25 ENCOUNTER — Other Ambulatory Visit: Payer: Self-pay

## 2020-10-25 ENCOUNTER — Other Ambulatory Visit: Payer: Self-pay | Admitting: Family Medicine

## 2020-10-25 ENCOUNTER — Ambulatory Visit: Payer: 59 | Attending: Family Medicine

## 2020-10-25 DIAGNOSIS — Z3480 Encounter for supervision of other normal pregnancy, unspecified trimester: Secondary | ICD-10-CM | POA: Insufficient documentation

## 2020-10-25 DIAGNOSIS — Z6833 Body mass index (BMI) 33.0-33.9, adult: Secondary | ICD-10-CM

## 2020-10-25 DIAGNOSIS — E669 Obesity, unspecified: Secondary | ICD-10-CM | POA: Diagnosis not present

## 2020-10-31 ENCOUNTER — Ambulatory Visit (INDEPENDENT_AMBULATORY_CARE_PROVIDER_SITE_OTHER): Payer: 59 | Admitting: Family Medicine

## 2020-10-31 ENCOUNTER — Other Ambulatory Visit (HOSPITAL_COMMUNITY)
Admission: RE | Admit: 2020-10-31 | Discharge: 2020-10-31 | Disposition: A | Discharge status: Home / Self Care | Payer: 59 | Source: Ambulatory Visit | Attending: Family Medicine | Admitting: Family Medicine

## 2020-10-31 ENCOUNTER — Other Ambulatory Visit: Payer: Self-pay

## 2020-10-31 VITALS — BP 106/61 | HR 84 | Wt 197.0 lb

## 2020-10-31 DIAGNOSIS — O09299 Supervision of pregnancy with other poor reproductive or obstetric history, unspecified trimester: Secondary | ICD-10-CM

## 2020-10-31 DIAGNOSIS — N898 Other specified noninflammatory disorders of vagina: Secondary | ICD-10-CM | POA: Diagnosis not present

## 2020-10-31 DIAGNOSIS — Z3A19 19 weeks gestation of pregnancy: Secondary | ICD-10-CM

## 2020-10-31 DIAGNOSIS — O283 Abnormal ultrasonic finding on antenatal screening of mother: Secondary | ICD-10-CM | POA: Insufficient documentation

## 2020-10-31 DIAGNOSIS — Z3480 Encounter for supervision of other normal pregnancy, unspecified trimester: Secondary | ICD-10-CM

## 2020-10-31 NOTE — Progress Notes (Signed)
lab

## 2020-10-31 NOTE — Progress Notes (Signed)
   PRENATAL VISIT NOTE  Subjective:  Diana Fuentes is a 30 y.o. G2P1001 at [redacted]w[redacted]d being seen today for ongoing prenatal care.  She is currently monitored for the following issues for this high-risk pregnancy and has Pelvic pain; Supervision of other normal pregnancy, antepartum; Adrenal adenoma, left; Depression; Hx of preeclampsia, prior pregnancy, currently pregnant; and Abnormal fetal ultrasound on their problem list.  Patient reports vaginal discharge with odor.  Contractions: Not present. Vag. Bleeding: None.  Movement: Present. Denies leaking of fluid.   The following portions of the patient's history were reviewed and updated as appropriate: allergies, current medications, past family history, past medical history, past social history, past surgical history and problem list.   Objective:   Vitals:   10/31/20 0953  BP: 106/61  Pulse: 84  Weight: 197 lb (89.4 kg)    Fetal Status: Fetal Heart Rate (bpm): 158   Movement: Present     General:  Alert, oriented and cooperative. Patient is in no acute distress.  Skin: Skin is warm and dry. No rash noted.   Cardiovascular: Normal heart rate noted  Respiratory: Normal respiratory effort, no problems with respiration noted  Abdomen: Soft, gravid, appropriate for gestational age.  Pain/Pressure: Present     Pelvic: Cervical exam deferred        Extremities: Normal range of motion.  Edema: None  Mental Status: Normal mood and affect. Normal behavior. Normal judgment and thought content.   Assessment and Plan:  Pregnancy: G2P1001 at [redacted]w[redacted]d 1. [redacted] weeks gestation of pregnancy  2. Supervision of other normal pregnancy, antepartum FHT and FH normal  3. Hx of preeclampsia, prior pregnancy, currently pregnant On ASA 81mg   4. Vaginal discharge Wet prep today  5. Abnormal fetal ultrasound ? Right sided aortic arch. Patient referred for fetal echo   Preterm labor symptoms and general obstetric precautions including but not limited to  vaginal bleeding, contractions, leaking of fluid and fetal movement were reviewed in detail with the patient. Please refer to After Visit Summary for other counseling recommendations.   No follow-ups on file.  Future Appointments  Date Time Provider [place]  11/22/2020  1:00 PM [facility] NURSE [facility]  11/22/2020  1:15 PM [facility] US2 [facility] [place], DO

## 2020-11-01 LAB — CERVICOVAGINAL ANCILLARY ONLY
Bacterial Vaginitis (gardnerella): POSITIVE — AB
Candida Glabrata: NEGATIVE
Candida Vaginitis: NEGATIVE
Comment: NEGATIVE
Comment: NEGATIVE
Comment: NEGATIVE

## 2020-11-04 ENCOUNTER — Other Ambulatory Visit: Payer: Self-pay

## 2020-11-04 DIAGNOSIS — N760 Acute vaginitis: Secondary | ICD-10-CM

## 2020-11-04 DIAGNOSIS — B9689 Other specified bacterial agents as the cause of diseases classified elsewhere: Secondary | ICD-10-CM

## 2020-11-04 MED ORDER — METRONIDAZOLE 500 MG PO TABS: 500.0000 mg | ORAL_TABLET | Freq: Two times a day (BID) | ORAL | 0 refills | Status: DC

## 2020-11-07 ENCOUNTER — Telehealth: Payer: Self-pay | Admitting: General Practice

## 2020-11-07 NOTE — Telephone Encounter (Signed)
Patient is scheduled for a fetal echo with Dr. Gladstone Pih office on 12/09/2020 at 0830.

## 2020-11-11 DIAGNOSIS — O358XX0 Maternal care for other (suspected) fetal abnormality and damage, not applicable or unspecified: Secondary | ICD-10-CM | POA: Diagnosis not present

## 2020-11-11 DIAGNOSIS — Z3A21 21 weeks gestation of pregnancy: Secondary | ICD-10-CM | POA: Diagnosis not present

## 2020-11-22 ENCOUNTER — Ambulatory Visit: Payer: 59 | Attending: Maternal & Fetal Medicine

## 2020-11-22 ENCOUNTER — Other Ambulatory Visit: Payer: Self-pay

## 2020-11-22 ENCOUNTER — Ambulatory Visit: Payer: 59 | Admitting: *Deleted

## 2020-11-22 ENCOUNTER — Encounter: Payer: Self-pay | Admitting: *Deleted

## 2020-11-22 DIAGNOSIS — E669 Obesity, unspecified: Secondary | ICD-10-CM

## 2020-11-22 DIAGNOSIS — O09292 Supervision of pregnancy with other poor reproductive or obstetric history, second trimester: Secondary | ICD-10-CM

## 2020-11-22 DIAGNOSIS — Z6833 Body mass index (BMI) 33.0-33.9, adult: Secondary | ICD-10-CM

## 2020-11-22 DIAGNOSIS — O99212 Obesity complicating pregnancy, second trimester: Secondary | ICD-10-CM | POA: Diagnosis not present

## 2020-11-22 DIAGNOSIS — O09299 Supervision of pregnancy with other poor reproductive or obstetric history, unspecified trimester: Secondary | ICD-10-CM | POA: Insufficient documentation

## 2020-11-22 DIAGNOSIS — Z3480 Encounter for supervision of other normal pregnancy, unspecified trimester: Secondary | ICD-10-CM | POA: Insufficient documentation

## 2020-11-22 DIAGNOSIS — Z3A23 23 weeks gestation of pregnancy: Secondary | ICD-10-CM | POA: Diagnosis not present

## 2020-11-22 DIAGNOSIS — O358XX0 Maternal care for other (suspected) fetal abnormality and damage, not applicable or unspecified: Secondary | ICD-10-CM | POA: Insufficient documentation

## 2020-11-25 ENCOUNTER — Other Ambulatory Visit: Payer: Self-pay | Admitting: *Deleted

## 2020-11-25 DIAGNOSIS — Q2547 Right aortic arch: Secondary | ICD-10-CM

## 2020-11-27 ENCOUNTER — Ambulatory Visit (INDEPENDENT_AMBULATORY_CARE_PROVIDER_SITE_OTHER): Payer: 59 | Admitting: Family Medicine

## 2020-11-27 ENCOUNTER — Other Ambulatory Visit: Payer: Self-pay

## 2020-11-27 ENCOUNTER — Encounter: Payer: Self-pay | Admitting: General Practice

## 2020-11-27 VITALS — BP 104/64 | HR 92 | Wt 202.0 lb

## 2020-11-27 DIAGNOSIS — O09299 Supervision of pregnancy with other poor reproductive or obstetric history, unspecified trimester: Secondary | ICD-10-CM

## 2020-11-27 DIAGNOSIS — D3502 Benign neoplasm of left adrenal gland: Secondary | ICD-10-CM

## 2020-11-27 DIAGNOSIS — O99891 Other specified diseases and conditions complicating pregnancy: Secondary | ICD-10-CM

## 2020-11-27 DIAGNOSIS — Z3480 Encounter for supervision of other normal pregnancy, unspecified trimester: Secondary | ICD-10-CM

## 2020-11-27 DIAGNOSIS — O283 Abnormal ultrasonic finding on antenatal screening of mother: Secondary | ICD-10-CM

## 2020-11-27 DIAGNOSIS — O09899 Supervision of other high risk pregnancies, unspecified trimester: Secondary | ICD-10-CM | POA: Insufficient documentation

## 2020-11-27 DIAGNOSIS — Z283 Underimmunization status: Secondary | ICD-10-CM

## 2020-11-27 DIAGNOSIS — Z2839 Other underimmunization status: Secondary | ICD-10-CM | POA: Insufficient documentation

## 2020-11-27 NOTE — Progress Notes (Signed)
   PRENATAL VISIT NOTE  Subjective:  Diana Fuentes is a 30 y.o. G2P1001 at [redacted]w[redacted]d being seen today for ongoing prenatal care.  She is currently monitored for the following issues for this low-risk pregnancy and has Pelvic pain; Supervision of other normal pregnancy, antepartum; Adrenal adenoma, left; Depression; Hx of preeclampsia, prior pregnancy, currently pregnant; Abnormal fetal ultrasound; and Rubella non-immune status, antepartum on their problem list.  Patient reports no complaints.  Contractions: Not present. Vag. Bleeding: None.  Movement: Present. Denies leaking of fluid.   The following portions of the patient's history were reviewed and updated as appropriate: allergies, current medications, past family history, past medical history, past social history, past surgical history and problem list.   Objective:   Vitals:   11/27/20 1109  BP: 104/64  Pulse: 92  Weight: 202 lb (91.6 kg)    Fetal Status: Fetal Heart Rate (bpm): 141 Fundal Height: 23 cm Movement: Present     General:  Alert, oriented and cooperative. Patient is in no acute distress.  Skin: Skin is warm and dry. No rash noted.   Cardiovascular: Normal heart rate noted  Respiratory: Normal respiratory effort, no problems with respiration noted  Abdomen: Soft, gravid, appropriate for gestational age.  Pain/Pressure: Absent     Pelvic: Cervical exam deferred        Extremities: Normal range of motion.  Edema: None  Mental Status: Normal mood and affect. Normal behavior. Normal judgment and thought content.   Assessment and Plan:  Pregnancy: G2P1001 at [redacted]w[redacted]d 1. Supervision of other normal pregnancy, antepartum Continue routine prenatal care. 28 wk labs next visit  2. Hx of preeclampsia, prior pregnancy, currently pregnant Declines ASA  3. Rubella non-immune status, antepartum MMR pp if she will take this  4. Abnormal fetal ultrasound Has had fetal ECHO, has right sided aorta-->no contraindications to  delivery at [facility]  5. Adrenal adenoma, left Needs pp f/u  Preterm labor symptoms and general obstetric precautions including but not limited to vaginal bleeding, contractions, leaking of fluid and fetal movement were reviewed in detail with the patient. Please refer to After Visit Summary for other counseling recommendations.   Return in 4 weeks (on 12/25/2020) for 28 wk labs.  Future Appointments  Date Time Provider [place]  12/20/2020  3:15 PM [facility] NURSE [facility]  12/20/2020  3:30 PM [facility] US3 [facility]  12/25/2020  9:15 AM Nehemiah Settle, Tanna [place]oy, DO [facility] None    Donnamae Jude, MD

## 2020-11-27 NOTE — Patient Instructions (Signed)

## 2020-12-13 DIAGNOSIS — Z419 Encounter for procedure for purposes other than remedying health state, unspecified: Secondary | ICD-10-CM | POA: Diagnosis not present

## 2020-12-20 ENCOUNTER — Ambulatory Visit: Payer: 59 | Admitting: *Deleted

## 2020-12-20 ENCOUNTER — Ambulatory Visit: Payer: 59 | Attending: Obstetrics and Gynecology

## 2020-12-20 ENCOUNTER — Other Ambulatory Visit: Payer: Self-pay

## 2020-12-20 ENCOUNTER — Encounter: Payer: Self-pay | Admitting: *Deleted

## 2020-12-20 DIAGNOSIS — O99212 Obesity complicating pregnancy, second trimester: Secondary | ICD-10-CM

## 2020-12-20 DIAGNOSIS — Z2839 Other underimmunization status: Secondary | ICD-10-CM | POA: Diagnosis not present

## 2020-12-20 DIAGNOSIS — Z3480 Encounter for supervision of other normal pregnancy, unspecified trimester: Secondary | ICD-10-CM

## 2020-12-20 DIAGNOSIS — Z8632 Personal history of gestational diabetes: Secondary | ICD-10-CM | POA: Diagnosis not present

## 2020-12-20 DIAGNOSIS — E669 Obesity, unspecified: Secondary | ICD-10-CM

## 2020-12-20 DIAGNOSIS — O09899 Supervision of other high risk pregnancies, unspecified trimester: Secondary | ICD-10-CM

## 2020-12-20 DIAGNOSIS — O358XX0 Maternal care for other (suspected) fetal abnormality and damage, not applicable or unspecified: Secondary | ICD-10-CM

## 2020-12-20 DIAGNOSIS — Q2547 Right aortic arch: Secondary | ICD-10-CM | POA: Diagnosis not present

## 2020-12-20 DIAGNOSIS — O99282 Endocrine, nutritional and metabolic diseases complicating pregnancy, second trimester: Secondary | ICD-10-CM

## 2020-12-20 DIAGNOSIS — O09299 Supervision of pregnancy with other poor reproductive or obstetric history, unspecified trimester: Secondary | ICD-10-CM | POA: Diagnosis not present

## 2020-12-20 DIAGNOSIS — Z8759 Personal history of other complications of pregnancy, childbirth and the puerperium: Secondary | ICD-10-CM

## 2020-12-20 DIAGNOSIS — Z3A27 27 weeks gestation of pregnancy: Secondary | ICD-10-CM | POA: Diagnosis not present

## 2020-12-23 ENCOUNTER — Other Ambulatory Visit: Payer: Self-pay | Admitting: *Deleted

## 2020-12-23 DIAGNOSIS — Q2547 Right aortic arch: Secondary | ICD-10-CM

## 2020-12-25 ENCOUNTER — Encounter: Payer: Self-pay | Admitting: General Practice

## 2020-12-25 ENCOUNTER — Ambulatory Visit (INDEPENDENT_AMBULATORY_CARE_PROVIDER_SITE_OTHER): Payer: 59 | Admitting: Family Medicine

## 2020-12-25 ENCOUNTER — Other Ambulatory Visit: Payer: Self-pay

## 2020-12-25 VITALS — BP 106/64 | HR 91 | Wt 207.0 lb

## 2020-12-25 DIAGNOSIS — O09299 Supervision of pregnancy with other poor reproductive or obstetric history, unspecified trimester: Secondary | ICD-10-CM

## 2020-12-25 DIAGNOSIS — D3502 Benign neoplasm of left adrenal gland: Secondary | ICD-10-CM

## 2020-12-25 DIAGNOSIS — Z3480 Encounter for supervision of other normal pregnancy, unspecified trimester: Secondary | ICD-10-CM

## 2020-12-25 DIAGNOSIS — O283 Abnormal ultrasonic finding on antenatal screening of mother: Secondary | ICD-10-CM

## 2020-12-25 DIAGNOSIS — Z2839 Other underimmunization status: Secondary | ICD-10-CM

## 2020-12-25 DIAGNOSIS — Z3A27 27 weeks gestation of pregnancy: Secondary | ICD-10-CM

## 2020-12-25 MED ORDER — FAMOTIDINE 20 MG PO TABS: 20.0000 mg | ORAL_TABLET | Freq: Two times a day (BID) | ORAL | 3 refills | Status: DC

## 2020-12-25 NOTE — Progress Notes (Signed)
   PRENATAL VISIT NOTE  Subjective:  Diana Fuentes is a 30 y.o. G2P1001 at [redacted]w[redacted]d being seen today for ongoing prenatal care.  She is currently monitored for the following issues for this high-risk pregnancy and has Pelvic pain; Supervision of other normal pregnancy, antepartum; Adrenal adenoma, left; Depression; Hx of preeclampsia, prior pregnancy, currently pregnant; Abnormal fetal ultrasound; and Rubella non-immune status, antepartum on their problem list.  Patient reports heartburn.  Contractions: Not present. Vag. Bleeding: None.  Movement: Present. Denies leaking of fluid.   The following portions of the patient's history were reviewed and updated as appropriate: allergies, current medications, past family history, past medical history, past social history, past surgical history and problem list.   Objective:   Vitals:   12/25/20 0817  BP: 106/64  Pulse: 91  Weight: 207 lb (93.9 kg)    Fetal Status: Fetal Heart Rate (bpm): 139 Fundal Height: 28 cm Movement: Present     General:  Alert, oriented and cooperative. Patient is in no acute distress.  Skin: Skin is warm and dry. No rash noted.   Cardiovascular: Normal heart rate noted  Respiratory: Normal respiratory effort, no problems with respiration noted  Abdomen: Soft, gravid, appropriate for gestational age.  Pain/Pressure: Absent     Pelvic: Cervical exam deferred        Extremities: Normal range of motion.  Edema: None  Mental Status: Normal mood and affect. Normal behavior. Normal judgment and thought content.   Assessment and Plan:  Pregnancy: G2P1001 at [redacted]w[redacted]d 1. [redacted] weeks gestation of pregnancy - CBC - Glucose Tolerance, 2 Hours w/1 Hour - HIV Antibody (routine testing w rflx) - RPR  2. Supervision of other normal pregnancy, antepartum FHT and FH normal  3. Abnormal fetal ultrasound Right sided aortic arch confirmed by echo. Following with [place]. [facility] to discuss delivery here   4. Rubella non-immune status,  antepartum MMR post delivery.  5. Hx of preeclampsia, prior pregnancy, currently pregnant ASA $RemoveBefo'81mg'rqOfNNYlRsu$  BP controlled  6. Adrenal adenoma, left Maternal - rpt imaging post delivery   Preterm labor symptoms and general obstetric precautions including but not limited to vaginal bleeding, contractions, leaking of fluid and fetal movement were reviewed in detail with the patient. Please refer to After Visit Summary for other counseling recommendations.   Return in about 2 weeks (around 01/08/2021) for OB f/u.  Future Appointments  Date Time Provider [place]  12/25/2020  9:15 AM Truett Mainland, DO [facility] None  01/31/2021  3:30 PM [facility] NURSE [facility]  01/31/2021  3:45 PM [facility] US5 [facility] [place], DO

## 2020-12-26 LAB — CBC
Hematocrit: 33.5 % — ABNORMAL LOW (ref 34.0–46.6)
Hemoglobin: 11 g/dL — ABNORMAL LOW (ref 11.1–15.9)
MCH: 30.1 pg (ref 26.6–33.0)
MCHC: 32.8 g/dL (ref 31.5–35.7)
MCV: 92 fL (ref 79–97)
Platelets: 336 10*3/uL (ref 150–450)
RBC: 3.66 x10E6/uL — ABNORMAL LOW (ref 3.77–5.28)
RDW: 11.8 % (ref 11.7–15.4)
WBC: 12.1 10*3/uL — ABNORMAL HIGH (ref 3.4–10.8)

## 2020-12-26 LAB — GLUCOSE TOLERANCE, 2 HOURS W/ 1HR
Glucose, 1 hour: 120 mg/dL (ref 65–179)
Glucose, 2 hour: 92 mg/dL (ref 65–152)
Glucose, Fasting: 81 mg/dL (ref 65–91)

## 2020-12-26 LAB — HIV ANTIBODY (ROUTINE TESTING W REFLEX): HIV Screen 4th Generation wRfx: NONREACTIVE

## 2020-12-26 LAB — RPR: RPR Ser Ql: NONREACTIVE

## 2020-12-31 ENCOUNTER — Other Ambulatory Visit: Payer: Self-pay

## 2020-12-31 ENCOUNTER — Encounter (HOSPITAL_COMMUNITY): Payer: Self-pay

## 2020-12-31 ENCOUNTER — Inpatient Hospital Stay (HOSPITAL_COMMUNITY)
Admission: EM | Admit: 2020-12-31 | Discharge: 2021-01-01 | Disposition: A | Discharge status: Home / Self Care | Payer: No Typology Code available for payment source | Attending: Obstetrics and Gynecology | Admitting: Obstetrics and Gynecology

## 2020-12-31 DIAGNOSIS — Z87891 Personal history of nicotine dependence: Secondary | ICD-10-CM | POA: Insufficient documentation

## 2020-12-31 DIAGNOSIS — Y92410 Unspecified street and highway as the place of occurrence of the external cause: Secondary | ICD-10-CM | POA: Insufficient documentation

## 2020-12-31 DIAGNOSIS — S134XXA Sprain of ligaments of cervical spine, initial encounter: Secondary | ICD-10-CM | POA: Insufficient documentation

## 2020-12-31 DIAGNOSIS — O368330 Maternal care for abnormalities of the fetal heart rate or rhythm, third trimester, not applicable or unspecified: Secondary | ICD-10-CM | POA: Insufficient documentation

## 2020-12-31 DIAGNOSIS — Z3A28 28 weeks gestation of pregnancy: Secondary | ICD-10-CM | POA: Diagnosis not present

## 2020-12-31 DIAGNOSIS — M542 Cervicalgia: Secondary | ICD-10-CM

## 2020-12-31 DIAGNOSIS — R109 Unspecified abdominal pain: Secondary | ICD-10-CM | POA: Diagnosis not present

## 2020-12-31 DIAGNOSIS — O99891 Other specified diseases and conditions complicating pregnancy: Secondary | ICD-10-CM | POA: Diagnosis not present

## 2020-12-31 DIAGNOSIS — O9A213 Injury, poisoning and certain other consequences of external causes complicating pregnancy, third trimester: Secondary | ICD-10-CM | POA: Insufficient documentation

## 2020-12-31 DIAGNOSIS — Z3A01 Less than 8 weeks gestation of pregnancy: Secondary | ICD-10-CM | POA: Diagnosis not present

## 2020-12-31 MED ORDER — CYCLOBENZAPRINE HCL 5 MG PO TABS
5.0000 mg | ORAL_TABLET | Freq: Once | ORAL | Status: AC
  Administered 2020-12-31: 5 mg via ORAL
  Filled 2020-12-31: qty 1

## 2020-12-31 NOTE — ED Notes (Signed)
Patient to MAU with rapid response OB nurse

## 2020-12-31 NOTE — ED Notes (Signed)
Transport called.
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
## 2020-12-31 NOTE — ED Notes (Signed)
[facility] called back stating pt has to be seen down here first.
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
## 2021-01-01 DIAGNOSIS — O9A213 Injury, poisoning and certain other consequences of external causes complicating pregnancy, third trimester: Secondary | ICD-10-CM | POA: Diagnosis not present

## 2021-01-01 DIAGNOSIS — Z3A28 28 weeks gestation of pregnancy: Secondary | ICD-10-CM

## 2021-01-01 NOTE — Discharge Instructions (Signed)
Cervical Sprain A cervical sprain is also called a neck sprain. It is a stretch or tear in one or more ligaments in the neck. Ligaments are tissues that connect bones to each other. Neck sprains can be mild, bad, or very bad. A very bad sprain in the neck can cause the bones in the neck to be unstable. This can damage the spinal cord. It can also cause serious problems in the brain, spinal cord, and nerves (nervous system). Most neck sprains heal in 4-6 weeks. It can take more or less time depending on:  What caused the injury.  The amount of injury. What are the causes? Neck sprains may be caused by trauma, such as:  An injury from an accident in a vehicle such as a car or boat.  A fall.  The head and neck being moved front to back or side to side all of a sudden (whiplash injury). Mild neck sprains may be caused by wear and tear over time. What increases the risk? The following factors may make you more likely to develop this condition:  Taking part in activities that put you at high risk of hurting your neck. These include: ? Contact sports. ? Animator. ? Gymnastics. ? Diving.  Taking risks when driving or riding in a vehicle such as a car or boat.  Arthritis caused by wear and tear of the joints in the spine.  The neck not being very strong or flexible.  Having had a neck injury in the past.  Poor posture.  Spending a lot of time in certain positions that put stress on the neck. This may be from sitting at a computer for a long time. What are the signs or symptoms? Symptoms of this condition include:  Your neck, shoulders, or upper back feeling: ? Painful or sore. ? Stiff. ? Tender. ? Swollen. ? Hot, or like it is burning.  Sudden tightening of neck muscles (spasms).  Not being able to move the neck very much.  Headache.  Feeling dizzy.  Feeling like you may vomit, or vomiting.  Having a hand or arm that: ? Feels weak. ? Loses feeling (feels  numb). ? Tingles. You may get symptoms right away after injury, or you may get them over a few days. In some cases, symptoms may go away with treatment and come back over time. How is this treated? This condition is treated by:  Resting your neck.  Icing the part of your neck that is hurt.  Doing exercises to restore movement and strength to your neck (physical therapy). If there is no swelling, you may use heat therapy 2-3 days after the injury took place. If your injury is very bad, treatment may also include:  Keeping your neck in place for a length of time. This may be done using: ? A neck collar. This supports your chin and the back of your head. ? A cervical traction device. This is a sling that holds up your head. The sling removes weight and pressure from your neck. It may also help to relieve pain.  Medicines that help with: ? Pain. ? Irritation and swelling (inflammation).  Medicines that help to relax your muscles (muscle relaxants).  Surgery. This is rare. Follow these instructions at home: Medicines  Take over-the-counter and prescription medicines only as told by your doctor.  Ask your doctor if the medicine prescribed to you: ? Requires you to avoid driving or using heavy machinery. ? Can cause trouble pooping (constipation). You  may need to take these actions to prevent or treat trouble pooping:  Drink enough fluid to keep your pee (urine) pale yellow.  Take over-the-counter or prescription medicines.  Eat foods that are high in fiber. These include beans, whole grains, and fresh fruits and vegetables.  Limit foods that are high in fat and sugar. These include fried or sweet foods.   If you have a neck collar:  Wear it as told by your doctor. Do not take it off unless told.  Ask your doctor before adjusting your collar.  If you have long hair, keep it outside of the collar.  Ask your doctor if you may take off the collar for cleaning and bathing. If you  may take off the collar: ? Follow instructions about how to take it off safely. ? Clean it by hand with mild soap and water. Let it air-dry fully. ? If your collar has pads that you can take out:  Take the pads out every 1-2 days.  Wash them by hand with soap and water.  Let the pads air-dry fully before you put them back in the collar. ? Tell your doctor if your skin under the collar has irritation or sores. Managing pain, stiffness, and swelling  Use a cervical traction device, if told by your doctor.  If told, put ice on the affected area. To do this: ? Put ice in a plastic bag. ? Place a towel between your skin and the bag. ? Leave the ice on for 20 minutes, 2-3 times a day.  If told, put heat on the affected area. Do this before exercise or as often as told by your doctor. Use the heat source that your doctor recommends, such as a moist heat pack or a heating pad. ? Place a towel between your skin and the heat source. ? Leave the heat on for 20-30 minutes. ? Take the heat off if your skin turns bright red. This is very important if you cannot feel pain, heat, or cold. You may have a greater risk of getting burned.      Activity  Do not drive while wearing a neck collar. If you do not have a neck collar, ask if it is safe to drive while your neck heals.  Do not lift anything that is heavier than 10 lb (4.5 kg), or the limit that you are told, until your doctor tells you that it is safe.  Rest as told by your doctor.  Do exercises as told by your doctor or physical therapist.  Return to your normal activities as told by your doctor. Avoid positions and activities that make you feel worse. Ask your doctor what activities are safe for you. General instructions  Do not use any products that contain nicotine or tobacco, such as cigarettes, e-cigarettes, and chewing tobacco. These can delay healing. If you need help quitting, ask your doctor.  Keep all follow-up visits as told  by your doctor or physical therapist. This is important. How is this prevented? To prevent a neck sprain from happening again:  Practice good posture. Adjust your workstation to help you do this.  Exercise regularly as told by your doctor or physical therapist.  Avoid activities that are risky or may cause a neck sprain. Contact a doctor if:  Your symptoms get worse.  Your symptoms do not get better after 2 weeks of treatment.  Your pain gets worse.  Medicine does not help your pain.  You have new symptoms  that you cannot explain.  Your neck collar gives you sores on your skin or bothers your skin. Get help right away if:  You have very bad pain.  You get any of the following in any part of your body: ? Loss of feeling. ? Tingling. ? Weakness.  You cannot move a part of your body.  You have neck pain and either of these: ? Very bad dizziness. ? A very bad headache. Summary  A cervical sprain is also called a neck sprain. It is a stretch or tear in one or more ligaments in the neck. Ligaments are tissues that connect bones.  Neck sprains may be caused by trauma, such as an injury or a fall.  You may get symptoms right away after injury, or you may get them over a few days.  Neck sprains may be treated with rest, heat, ice, medicines, exercise, and surgery. This information is not intended to replace advice given to you by your health care provider. Make sure you discuss any questions you have with your health care provider. Document Revised: 05/10/2019 Document Reviewed: 05/10/2019 Elsevier Patient Education  [place].

## 2021-01-08 ENCOUNTER — Other Ambulatory Visit: Payer: Self-pay

## 2021-01-08 ENCOUNTER — Encounter: Payer: Self-pay | Admitting: General Practice

## 2021-01-08 ENCOUNTER — Ambulatory Visit (INDEPENDENT_AMBULATORY_CARE_PROVIDER_SITE_OTHER): Payer: 59 | Admitting: Family Medicine

## 2021-01-08 VITALS — BP 114/62 | HR 96 | Wt 210.0 lb

## 2021-01-08 DIAGNOSIS — Z3A29 29 weeks gestation of pregnancy: Secondary | ICD-10-CM | POA: Diagnosis not present

## 2021-01-08 DIAGNOSIS — O283 Abnormal ultrasonic finding on antenatal screening of mother: Secondary | ICD-10-CM

## 2021-01-08 DIAGNOSIS — Z3480 Encounter for supervision of other normal pregnancy, unspecified trimester: Secondary | ICD-10-CM

## 2021-01-08 DIAGNOSIS — O09299 Supervision of pregnancy with other poor reproductive or obstetric history, unspecified trimester: Secondary | ICD-10-CM

## 2021-01-08 DIAGNOSIS — M9901 Segmental and somatic dysfunction of cervical region: Secondary | ICD-10-CM | POA: Diagnosis not present

## 2021-01-08 DIAGNOSIS — O09899 Supervision of other high risk pregnancies, unspecified trimester: Secondary | ICD-10-CM

## 2021-01-08 DIAGNOSIS — M542 Cervicalgia: Secondary | ICD-10-CM

## 2021-01-08 DIAGNOSIS — D3502 Benign neoplasm of left adrenal gland: Secondary | ICD-10-CM

## 2021-01-08 DIAGNOSIS — M9902 Segmental and somatic dysfunction of thoracic region: Secondary | ICD-10-CM | POA: Diagnosis not present

## 2021-01-08 DIAGNOSIS — Z2839 Other underimmunization status: Secondary | ICD-10-CM

## 2021-01-08 MED ORDER — CYCLOBENZAPRINE HCL 10 MG PO TABS: 5.0000 mg | ORAL_TABLET | Freq: Three times a day (TID) | ORAL | 2 refills | Status: DC | PRN
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
## 2021-01-12 DIAGNOSIS — Z419 Encounter for procedure for purposes other than remedying health state, unspecified: Secondary | ICD-10-CM | POA: Diagnosis not present

## 2021-01-22 ENCOUNTER — Encounter: Payer: Self-pay | Admitting: General Practice

## 2021-01-22 ENCOUNTER — Ambulatory Visit (INDEPENDENT_AMBULATORY_CARE_PROVIDER_SITE_OTHER): Payer: 59 | Admitting: Family Medicine

## 2021-01-22 ENCOUNTER — Other Ambulatory Visit: Payer: Self-pay

## 2021-01-22 VITALS — BP 123/66 | HR 77 | Wt 215.0 lb

## 2021-01-22 DIAGNOSIS — Z3480 Encounter for supervision of other normal pregnancy, unspecified trimester: Secondary | ICD-10-CM

## 2021-01-22 DIAGNOSIS — Z3A31 31 weeks gestation of pregnancy: Secondary | ICD-10-CM

## 2021-01-22 DIAGNOSIS — Z2839 Other underimmunization status: Secondary | ICD-10-CM

## 2021-01-22 DIAGNOSIS — O09899 Supervision of other high risk pregnancies, unspecified trimester: Secondary | ICD-10-CM

## 2021-01-22 DIAGNOSIS — O283 Abnormal ultrasonic finding on antenatal screening of mother: Secondary | ICD-10-CM

## 2021-01-22 DIAGNOSIS — O09299 Supervision of pregnancy with other poor reproductive or obstetric history, unspecified trimester: Secondary | ICD-10-CM

## 2021-01-22 DIAGNOSIS — D3502 Benign neoplasm of left adrenal gland: Secondary | ICD-10-CM

## 2021-01-22 NOTE — Progress Notes (Signed)
   PRENATAL VISIT NOTE  Subjective:  Diana Fuentes is a 30 y.o. G2P1001 at [redacted]w[redacted]d being seen today for ongoing prenatal care.  She is currently monitored for the following issues for this high-risk pregnancy and has Pelvic pain; Supervision of other normal pregnancy, antepartum; Adrenal adenoma, left; Depression; Hx of preeclampsia, prior pregnancy, currently pregnant; Abnormal fetal ultrasound; and Rubella non-immune status, antepartum on their problem list.  Patient reports no complaints.  Contractions: Not present. Vag. Bleeding: None.  Movement: Present. Denies leaking of fluid.   The following portions of the patient's history were reviewed and updated as appropriate: allergies, current medications, past family history, past medical history, past social history, past surgical history and problem list.   Objective:   Vitals:   01/22/21 0900  BP: 123/66  Pulse: 77  Weight: 215 lb (97.5 kg)    Fetal Status: Fetal Heart Rate (bpm): 140 Fundal Height: 31 cm Movement: Present     General:  Alert, oriented and cooperative. Patient is in no acute distress.  Skin: Skin is warm and dry. No rash noted.   Cardiovascular: Normal heart rate noted  Respiratory: Normal respiratory effort, no problems with respiration noted  Abdomen: Soft, gravid, appropriate for gestational age.  Pain/Pressure: Absent     Pelvic: Cervical exam deferred        Extremities: Normal range of motion.  Edema: None  Mental Status: Normal mood and affect. Normal behavior. Normal judgment and thought content.   Assessment and Plan:  Pregnancy: G2P1001 at [redacted]w[redacted]d 1. [redacted] weeks gestation of pregnancy  2. Supervision of other normal pregnancy, antepartum FHT and FH normal  3. Rubella non-immune status, antepartum MMR post delivery  4. Abnormal fetal ultrasound Right aortic arch Peds Cardiology cleared delivery here - will be discussed with NICU at next [facility] meeting  5. Adrenal adenoma, left Imaging post  delivery  6. Hx of preeclampsia, prior pregnancy, currently pregnant ASA $[id number]$   Preterm labor symptoms and general obstetric precautions including but not limited to vaginal bleeding, contractions, leaking of fluid and fetal movement were reviewed in detail with the patient. Please refer to After Visit Summary for other counseling recommendations.   No follow-ups on file.  Future Appointments  Date Time Provider [place]  01/31/2021  3:30 PM [facility]URSE [facility]  01/31/2021  3:45 PM [facility] US5 [facility]  02/05/2021  8:45 AM Clarnce Flock, MD [facility] None  02/19/2021  8:45 AM Truett Mainland, DO [facility] None  02/26/2021  8:45 AM Truett Mainland, DO [facility] None  03/06/2021  8:45 AM Truett Mainland, DO [facility] None  03/13/2021  8:45 AM Truett Mainland, DO [facility] None    Truett Mainland, DO

## 2021-01-31 ENCOUNTER — Ambulatory Visit: Payer: 59 | Admitting: *Deleted

## 2021-01-31 ENCOUNTER — Other Ambulatory Visit: Payer: Self-pay

## 2021-01-31 ENCOUNTER — Ambulatory Visit: Payer: 59 | Attending: Obstetrics and Gynecology

## 2021-01-31 ENCOUNTER — Encounter: Payer: Self-pay | Admitting: *Deleted

## 2021-01-31 ENCOUNTER — Other Ambulatory Visit: Payer: Self-pay | Admitting: Obstetrics

## 2021-01-31 DIAGNOSIS — O09299 Supervision of pregnancy with other poor reproductive or obstetric history, unspecified trimester: Secondary | ICD-10-CM | POA: Diagnosis not present

## 2021-01-31 DIAGNOSIS — O35BXX0 Maternal care for other (suspected) fetal abnormality and damage, fetal cardiac anomalies, not applicable or unspecified: Secondary | ICD-10-CM

## 2021-01-31 DIAGNOSIS — Z2839 Other underimmunization status: Secondary | ICD-10-CM

## 2021-01-31 DIAGNOSIS — Z3A33 33 weeks gestation of pregnancy: Secondary | ICD-10-CM | POA: Diagnosis not present

## 2021-01-31 DIAGNOSIS — E669 Obesity, unspecified: Secondary | ICD-10-CM

## 2021-01-31 DIAGNOSIS — O358XX0 Maternal care for other (suspected) fetal abnormality and damage, not applicable or unspecified: Secondary | ICD-10-CM | POA: Insufficient documentation

## 2021-01-31 DIAGNOSIS — Z3480 Encounter for supervision of other normal pregnancy, unspecified trimester: Secondary | ICD-10-CM

## 2021-01-31 DIAGNOSIS — Q2547 Right aortic arch: Secondary | ICD-10-CM | POA: Insufficient documentation

## 2021-01-31 DIAGNOSIS — O99212 Obesity complicating pregnancy, second trimester: Secondary | ICD-10-CM

## 2021-01-31 DIAGNOSIS — Z8759 Personal history of other complications of pregnancy, childbirth and the puerperium: Secondary | ICD-10-CM | POA: Diagnosis not present

## 2021-02-05 ENCOUNTER — Encounter: Payer: Self-pay | Admitting: General Practice

## 2021-02-05 ENCOUNTER — Encounter: Payer: Self-pay | Admitting: Family Medicine

## 2021-02-05 ENCOUNTER — Ambulatory Visit (INDEPENDENT_AMBULATORY_CARE_PROVIDER_SITE_OTHER): Payer: 59 | Admitting: Family Medicine

## 2021-02-05 ENCOUNTER — Other Ambulatory Visit: Payer: Self-pay

## 2021-02-05 VITALS — BP 110/68 | HR 92 | Wt 213.0 lb

## 2021-02-05 DIAGNOSIS — F32A Depression, unspecified: Secondary | ICD-10-CM

## 2021-02-05 DIAGNOSIS — Z2839 Other underimmunization status: Secondary | ICD-10-CM

## 2021-02-05 DIAGNOSIS — O09899 Supervision of other high risk pregnancies, unspecified trimester: Secondary | ICD-10-CM

## 2021-02-05 DIAGNOSIS — Z3480 Encounter for supervision of other normal pregnancy, unspecified trimester: Secondary | ICD-10-CM

## 2021-02-05 DIAGNOSIS — O09299 Supervision of pregnancy with other poor reproductive or obstetric history, unspecified trimester: Secondary | ICD-10-CM

## 2021-02-05 DIAGNOSIS — D3502 Benign neoplasm of left adrenal gland: Secondary | ICD-10-CM

## 2021-02-05 DIAGNOSIS — O283 Abnormal ultrasonic finding on antenatal screening of mother: Secondary | ICD-10-CM

## 2021-02-05 NOTE — Progress Notes (Signed)
   Subjective:  Diana Fuentes is a 30 y.o. G2P1001 at [redacted]w[redacted]d being seen today for ongoing prenatal care.  She is currently monitored for the following issues for this high-risk pregnancy and has Pelvic pain; Supervision of other normal pregnancy, antepartum; Adrenal adenoma, left; Depression; Hx of preeclampsia, prior pregnancy, currently pregnant; Abnormal fetal ultrasound; and Rubella non-immune status, antepartum on their problem list.  Patient reports no complaints.  Contractions: Not present. Vag. Bleeding: None.  Movement: Present. Denies leaking of fluid.   The following portions of the patient's history were reviewed and updated as appropriate: allergies, current medications, past family history, past medical history, past social history, past surgical history and problem list. Problem list updated.  Objective:   Vitals:   02/05/21 0844  BP: 110/68  Pulse: 92  Weight: 213 lb (96.6 kg)    Fetal Status: Fetal Heart Rate (bpm): 155   Movement: Present     General:  Alert, oriented and cooperative. Patient is in no acute distress.  Skin: Skin is warm and dry. No rash noted.   Cardiovascular: Normal heart rate noted  Respiratory: Normal respiratory effort, no problems with respiration noted  Abdomen: Soft, gravid, appropriate for gestational age. Pain/Pressure: Absent     Pelvic: Vag. Bleeding: None     Cervical exam deferred        Extremities: Normal range of motion.  Edema: Trace  Mental Status: Normal mood and affect. Normal behavior. Normal judgment and thought content.   Urinalysis:      Assessment and Plan:  Pregnancy: G2P1001 at [redacted]w[redacted]d  1. Supervision of other normal pregnancy, antepartum BP and FHR normal Contraception discussed, still deciding  2. Rubella non-immune status, antepartum   3. Abnormal fetal ultrasound Cleared by Peds Cardiology to deliver at [facility]  4. Hx of preeclampsia, prior pregnancy, currently pregnant BP normal, not on ASA  5. Depression,  unspecified depression type   6. Adrenal adenoma, left outpt imaging postpartum  Preterm labor symptoms and general obstetric precautions including but not limited to vaginal bleeding, contractions, leaking of fluid and fetal movement were reviewed in detail with the patient. Please refer to After Visit Summary for other counseling recommendations.  Return in 2 weeks (on 02/19/2021) for [facility], ob visit.   Clarnce Flock, MD

## 2021-02-05 NOTE — Patient Instructions (Signed)
Contraception Choices Contraception, also called birth control, refers to methods or devices that prevent pregnancy. Hormonal methods Contraceptive implant A contraceptive implant is a thin, plastic tube that contains a hormone that prevents pregnancy. It is different from an intrauterine device (IUD). It is inserted into the upper part of the arm by a health care provider. Implants can be effective for up to 3 years. Progestin-only injections Progestin-only injections are injections of progestin, a synthetic form of the hormone progesterone. They are given every 3 months by a health care provider. Birth control pills Birth control pills are pills that contain hormones that prevent pregnancy. They must be taken once a day, preferably at the same time each day. A prescription is needed to use this method of contraception. Birth control patch The birth control patch contains hormones that prevent pregnancy. It is placed on the skin and must be changed once a week for three weeks and removed on the fourth week. A prescription is needed to use this method of contraception. Vaginal ring A vaginal ring contains hormones that prevent pregnancy. It is placed in the vagina for three weeks and removed on the fourth week. After that, the process is repeated with a new ring. A prescription is needed to use this method of contraception. Emergency contraceptive Emergency contraceptives prevent pregnancy after unprotected sex. They come in pill form and can be taken up to 5 days after sex. They work best the sooner they are taken after having sex. Most emergency contraceptives are available without a prescription. This method should not be used as your only form of birth control.   Barrier methods Female condom A female condom is a thin sheath that is worn over the penis during sex. Condoms keep sperm from going inside a woman's body. They can be used with a sperm-killing substance (spermicide) to increase their  effectiveness. They should be thrown away after one use. Female condom A female condom is a soft, loose-fitting sheath that is put into the vagina before sex. The condom keeps sperm from going inside a woman's body. They should be thrown away after one use. Diaphragm A diaphragm is a soft, dome-shaped barrier. It is inserted into the vagina before sex, along with a spermicide. The diaphragm blocks sperm from entering the uterus, and the spermicide kills sperm. A diaphragm should be left in the vagina for 6-8 hours after sex and removed within 24 hours. A diaphragm is prescribed and fitted by a health care provider. A diaphragm should be replaced every 1-2 years, after giving birth, after gaining more than 15 lb (6.8 kg), and after pelvic surgery. Cervical cap A cervical cap is a round, soft latex or plastic cup that fits over the cervix. It is inserted into the vagina before sex, along with spermicide. It blocks sperm from entering the uterus. The cap should be left in place for 6-8 hours after sex and removed within 48 hours. A cervical cap must be prescribed and fitted by a health care provider. It should be replaced every 2 years. Sponge A sponge is a soft, circular piece of polyurethane foam with spermicide in it. The sponge helps block sperm from entering the uterus, and the spermicide kills sperm. To use it, you make it wet and then insert it into the vagina. It should be inserted before sex, left in for at least 6 hours after sex, and removed and thrown away within 30 hours. Spermicides Spermicides are chemicals that kill or block sperm from entering the  cervix and uterus. They can come as a cream, jelly, suppository, foam, or tablet. A spermicide should be inserted into the vagina with an applicator at least 66-06 minutes before sex to allow time for it to work. The process must be repeated every time you have sex. Spermicides do not require a prescription.   Intrauterine  contraception Intrauterine device (IUD) An IUD is a T-shaped device that is put in a woman's uterus. There are two types:  Hormone IUD.This type contains progestin, a synthetic form of the hormone progesterone. This type can stay in place for 3-5 years.  Copper IUD.This type is wrapped in copper wire. It can stay in place for 10 years. Permanent methods of contraception Female tubal ligation In this method, a woman's fallopian tubes are sealed, tied, or blocked during surgery to prevent eggs from traveling to the uterus. Hysteroscopic sterilization In this method, a small, flexible insert is placed into each fallopian tube. The inserts cause scar tissue to form in the fallopian tubes and block them, so sperm cannot reach an egg. The procedure takes about 3 months to be effective. Another form of birth control must be used during those 3 months. Female sterilization This is a procedure to tie off the tubes that carry sperm (vasectomy). After the procedure, the man can still ejaculate fluid (semen). Another form of birth control must be used for 3 months after the procedure. Natural planning methods Natural family planning In this method, a couple does not have sex on days when the woman could become pregnant. Calendar method In this method, the woman keeps track of the length of each menstrual cycle, identifies the days when pregnancy can happen, and does not have sex on those days. Ovulation method In this method, a couple avoids sex during ovulation. Symptothermal method This method involves not having sex during ovulation. The woman typically checks for ovulation by watching changes in her temperature and in the consistency of cervical mucus. Post-ovulation method In this method, a couple waits to have sex until after ovulation. Where to find more information  Centers for Disease Control and Prevention: http://www.wolf.info/ Summary  Contraception, also called birth control, refers to methods or  devices that prevent pregnancy.  Hormonal methods of contraception include implants, injections, pills, patches, vaginal rings, and emergency contraceptives.  Barrier methods of contraception can include female condoms, female condoms, diaphragms, cervical caps, sponges, and spermicides.  There are two types of IUDs (intrauterine devices). An IUD can be put in a woman's uterus to prevent pregnancy for 3-5 years.  Permanent sterilization can be done through a procedure for males and females. Natural family planning methods involve nothaving sex on days when the woman could become pregnant. This information is not intended to replace advice given to you by your health care provider. Make sure you discuss any questions you have with your health care provider. Document Revised: 02/05/2020 Document Reviewed: 02/05/2020 Elsevier Patient Education  [place].   Breastfeeding  Choosing to breastfeed is one of the best decisions you can make for yourself and your baby. A change in hormones during pregnancy causes your breasts to make breast milk in your milk-producing glands. Hormones prevent breast milk from being released before your baby is born. They also prompt milk flow after birth. Once breastfeeding has begun, thoughts of your baby, as well as his or her sucking or crying, can stimulate the release of milk from your milk-producing glands. Benefits of breastfeeding Research shows that breastfeeding offers many health benefits  for infants and mothers. It also offers a cost-free and convenient way to feed your baby. For your baby  Your first milk (colostrum) helps your baby's digestive system to function better.  Special cells in your milk (antibodies) help your baby to fight off infections.  Breastfed babies are less likely to develop asthma, allergies, obesity, or type 2 diabetes. They are also at lower risk for sudden infant death syndrome (SIDS).  Nutrients in breast milk are better  able to meet your baby's needs compared to infant formula.  Breast milk improves your baby's brain development. For you  Breastfeeding helps to create a very special bond between you and your baby.  Breastfeeding is convenient. Breast milk costs nothing and is always available at the correct temperature.  Breastfeeding helps to burn calories. It helps you to lose the weight that you gained during pregnancy.  Breastfeeding makes your uterus return faster to its size before pregnancy. It also slows bleeding (lochia) after you give birth.  Breastfeeding helps to lower your risk of developing type 2 diabetes, osteoporosis, rheumatoid arthritis, cardiovascular disease, and breast, ovarian, uterine, and endometrial cancer later in life. Breastfeeding basics Starting breastfeeding  Find a comfortable place to sit or lie down, with your neck and back well-supported.  Place a pillow or a rolled-up blanket under your baby to bring him or her to the level of your breast (if you are seated). Nursing pillows are specially designed to help support your arms and your baby while you breastfeed.  Make sure that your baby's tummy (abdomen) is facing your abdomen.  Gently massage your breast. With your fingertips, massage from the outer edges of your breast inward toward the nipple. This encourages milk flow. If your milk flows slowly, you may need to continue this action during the feeding.  Support your breast with 4 fingers underneath and your thumb above your nipple (make the letter "C" with your hand). Make sure your fingers are well away from your nipple and your baby's mouth.  Stroke your baby's lips gently with your finger or nipple.  When your baby's mouth is open wide enough, quickly bring your baby to your breast, placing your entire nipple and as much of the areola as possible into your baby's mouth. The areola is the colored area around your nipple. ? More areola should be visible above your  baby's upper lip than below the lower lip. ? Your baby's lips should be opened and extended outward (flanged) to ensure an adequate, comfortable latch. ? Your baby's tongue should be between his or her lower gum and your breast.  Make sure that your baby's mouth is correctly positioned around your nipple (latched). Your baby's lips should create a seal on your breast and be turned out (everted).  It is common for your baby to suck about 2-3 minutes in order to start the flow of breast milk. Latching Teaching your baby how to latch onto your breast properly is very important. An improper latch can cause nipple pain, decreased milk supply, and poor weight gain in your baby. Also, if your baby is not latched onto your nipple properly, he or she may swallow some air during feeding. This can make your baby fussy. Burping your baby when you switch breasts during the feeding can help to get rid of the air. However, teaching your baby to latch on properly is still the best way to prevent fussiness from swallowing air while breastfeeding. Signs that your baby has successfully latched onto  your nipple  Silent tugging or silent sucking, without causing you pain. Infant's lips should be extended outward (flanged).  Swallowing heard between every 3-4 sucks once your milk has started to flow (after your let-down milk reflex occurs).  Muscle movement above and in front of his or her ears while sucking. Signs that your baby has not successfully latched onto your nipple  Sucking sounds or smacking sounds from your baby while breastfeeding.  Nipple pain. If you think your baby has not latched on correctly, slip your finger into the corner of your baby's mouth to break the suction and place it between your baby's gums. Attempt to start breastfeeding again. Signs of successful breastfeeding Signs from your baby  Your baby will gradually decrease the number of sucks or will completely stop sucking.  Your baby  will fall asleep.  Your baby's body will relax.  Your baby will retain a small amount of milk in his or her mouth.  Your baby will let go of your breast by himself or herself. Signs from you  Breasts that have increased in firmness, weight, and size 1-3 hours after feeding.  Breasts that are softer immediately after breastfeeding.  Increased milk volume, as well as a change in milk consistency and color by the fifth day of breastfeeding.  Nipples that are not sore, cracked, or bleeding. Signs that your baby is getting enough milk  Wetting at least 1-2 diapers during the first 24 hours after birth.  Wetting at least 5-6 diapers every 24 hours for the first week after birth. The urine should be clear or pale yellow by the age of 5 days.  Wetting 6-8 diapers every 24 hours as your baby continues to grow and develop.  At least 3 stools in a 24-hour period by the age of 5 days. The stool should be soft and yellow.  At least 3 stools in a 24-hour period by the age of 7 days. The stool should be seedy and yellow.  No loss of weight greater than 10% of birth weight during the first 3 days of life.  Average weight gain of 4-7 oz (113-198 g) per week after the age of 4 days.  Consistent daily weight gain by the age of 5 days, without weight loss after the age of 2 weeks. After a feeding, your baby may spit up a small amount of milk. This is normal. Breastfeeding frequency and duration Frequent feeding will help you make more milk and can prevent sore nipples and extremely full breasts (breast engorgement). Breastfeed when you feel the need to reduce the fullness of your breasts or when your baby shows signs of hunger. This is called "breastfeeding on demand." Signs that your baby is hungry include:  Increased alertness, activity, or restlessness.  Movement of the head from side to side.  Opening of the mouth when the corner of the mouth or cheek is stroked (rooting).  Increased  sucking sounds, smacking lips, cooing, sighing, or squeaking.  Hand-to-mouth movements and sucking on fingers or hands.  Fussing or crying. Avoid introducing a pacifier to your baby in the first 4-6 weeks after your baby is born. After this time, you may choose to use a pacifier. Research has shown that pacifier use during the first year of a baby's life decreases the risk of sudden infant death syndrome (SIDS). Allow your baby to feed on each breast as long as he or she wants. When your baby unlatches or falls asleep while feeding from the  first breast, offer the second breast. Because newborns are often sleepy in the first few weeks of life, you may need to awaken your baby to get him or her to feed. Breastfeeding times will vary from baby to baby. However, the following rules can serve as a guide to help you make sure that your baby is properly fed:  Newborns (babies 33 weeks of age or younger) may breastfeed every 1-3 hours.  Newborns should not go without breastfeeding for longer than 3 hours during the day or 5 hours during the night.  You should breastfeed your baby a minimum of 8 times in a 24-hour period. Breast milk pumping Pumping and storing breast milk allows you to make sure that your baby is exclusively fed your breast milk, even at times when you are unable to breastfeed. This is especially important if you go back to work while you are still breastfeeding, or if you are not able to be present during feedings. Your lactation consultant can help you find a method of pumping that works best for you and give you guidelines about how long it is safe to store breast milk.      Caring for your breasts while you breastfeed Nipples can become dry, cracked, and sore while breastfeeding. The following recommendations can help keep your breasts moisturized and healthy:  Avoid using soap on your nipples.  Wear a supportive bra designed especially for nursing. Avoid wearing underwire-style  bras or extremely tight bras (sports bras).  Air-dry your nipples for 3-4 minutes after each feeding.  Use only cotton bra pads to absorb leaked breast milk. Leaking of breast milk between feedings is normal.  Use lanolin on your nipples after breastfeeding. Lanolin helps to maintain your skin's normal moisture barrier. Pure lanolin is not harmful (not toxic) to your baby. You may also hand express a few drops of breast milk and gently massage that milk into your nipples and allow the milk to air-dry. In the first few weeks after giving birth, some women experience breast engorgement. Engorgement can make your breasts feel heavy, warm, and tender to the touch. Engorgement peaks within 3-5 days after you give birth. The following recommendations can help to ease engorgement:  Completely empty your breasts while breastfeeding or pumping. You may want to start by applying warm, moist heat (in the shower or with warm, water-soaked hand towels) just before feeding or pumping. This increases circulation and helps the milk flow. If your baby does not completely empty your breasts while breastfeeding, pump any extra milk after he or she is finished.  Apply ice packs to your breasts immediately after breastfeeding or pumping, unless this is too uncomfortable for you. To do this: ? Put ice in a plastic bag. ? Place a towel between your skin and the bag. ? Leave the ice on for 20 minutes, 2-3 times a day.  Make sure that your baby is latched on and positioned properly while breastfeeding. If engorgement persists after 48 hours of following these recommendations, contact your health care provider or a Science writer. Overall health care recommendations while breastfeeding  Eat 3 healthy meals and 3 snacks every day. Well-nourished mothers who are breastfeeding need an additional 450-500 calories a day. You can meet this requirement by increasing the amount of a balanced diet that you eat.  Drink  enough water to keep your urine pale yellow or clear.  Rest often, relax, and continue to take your prenatal vitamins to prevent fatigue, stress, and low  vitamin and mineral levels in your body (nutrient deficiencies).  Do not use any products that contain nicotine or tobacco, such as cigarettes and e-cigarettes. Your baby may be harmed by chemicals from cigarettes that pass into breast milk and exposure to secondhand smoke. If you need help quitting, ask your health care provider.  Avoid alcohol.  Do not use illegal drugs or marijuana.  Talk with your health care provider before taking any medicines. These include over-the-counter and prescription medicines as well as vitamins and herbal supplements. Some medicines that may be harmful to your baby can pass through breast milk.  It is possible to become pregnant while breastfeeding. If birth control is desired, ask your health care provider about options that will be safe while breastfeeding your baby. Where to find more information: [employer]: www.llli.org Contact a health care provider if:  You feel like you want to stop breastfeeding or have become frustrated with breastfeeding.  Your nipples are cracked or bleeding.  Your breasts are red, tender, or warm.  You have: ? Painful breasts or nipples. ? A swollen area on either breast. ? A fever or chills. ? Nausea or vomiting. ? Drainage other than breast milk from your nipples.  Your breasts do not become full before feedings by the fifth day after you give birth.  You feel sad and depressed.  Your baby is: ? Too sleepy to eat well. ? Having trouble sleeping. ? More than 45 week old and wetting fewer than 6 diapers in a 24-hour period. ? Not gaining weight by 47 days of age.  Your baby has fewer than 3 stools in a 24-hour period.  Your baby's skin or the white parts of his or her eyes become yellow. Get help right away if:  Your baby is overly tired  (lethargic) and does not want to wake up and feed.  Your baby develops an unexplained fever. Summary  Breastfeeding offers many health benefits for infant and mothers.  Try to breastfeed your infant when he or she shows early signs of hunger.  Gently tickle or stroke your baby's lips with your finger or nipple to allow the baby to open his or her mouth. Bring the baby to your breast. Make sure that much of the areola is in your baby's mouth. Offer one side and burp the baby before you offer the other side.  Talk with your health care provider or lactation consultant if you have questions or you face problems as you breastfeed. This information is not intended to replace advice given to you by your health care provider. Make sure you discuss any questions you have with your health care provider. Document Revised: 11/25/2017 Document Reviewed: 10/02/2016 Elsevier Patient Education  2021 [employer].

## 2021-02-05 NOTE — Progress Notes (Signed)
+   Fetal movement. No complaints.

## 2021-02-12 DIAGNOSIS — Z419 Encounter for procedure for purposes other than remedying health state, unspecified: Secondary | ICD-10-CM | POA: Diagnosis not present

## 2021-02-19 ENCOUNTER — Other Ambulatory Visit (HOSPITAL_COMMUNITY)
Admission: RE | Admit: 2021-02-19 | Discharge: 2021-02-19 | Disposition: A | Discharge status: Home / Self Care | Payer: 59 | Source: Ambulatory Visit | Attending: Family Medicine | Admitting: Family Medicine

## 2021-02-19 ENCOUNTER — Other Ambulatory Visit: Payer: Self-pay

## 2021-02-19 ENCOUNTER — Ambulatory Visit (INDEPENDENT_AMBULATORY_CARE_PROVIDER_SITE_OTHER): Payer: 59 | Admitting: Family Medicine

## 2021-02-19 VITALS — BP 111/78 | HR 97 | Temp 98.4°F | Wt 218.0 lb

## 2021-02-19 DIAGNOSIS — O09299 Supervision of pregnancy with other poor reproductive or obstetric history, unspecified trimester: Secondary | ICD-10-CM

## 2021-02-19 DIAGNOSIS — M546 Pain in thoracic spine: Secondary | ICD-10-CM | POA: Diagnosis not present

## 2021-02-19 DIAGNOSIS — Z3A35 35 weeks gestation of pregnancy: Secondary | ICD-10-CM

## 2021-02-19 DIAGNOSIS — Z2839 Other underimmunization status: Secondary | ICD-10-CM

## 2021-02-19 DIAGNOSIS — Z3480 Encounter for supervision of other normal pregnancy, unspecified trimester: Secondary | ICD-10-CM

## 2021-02-19 DIAGNOSIS — G8929 Other chronic pain: Secondary | ICD-10-CM

## 2021-02-19 DIAGNOSIS — M9901 Segmental and somatic dysfunction of cervical region: Secondary | ICD-10-CM

## 2021-02-19 DIAGNOSIS — M9900 Segmental and somatic dysfunction of head region: Secondary | ICD-10-CM | POA: Diagnosis not present

## 2021-02-19 DIAGNOSIS — M9908 Segmental and somatic dysfunction of rib cage: Secondary | ICD-10-CM

## 2021-02-19 DIAGNOSIS — O09293 Supervision of pregnancy with other poor reproductive or obstetric history, third trimester: Secondary | ICD-10-CM

## 2021-02-19 DIAGNOSIS — O283 Abnormal ultrasonic finding on antenatal screening of mother: Secondary | ICD-10-CM

## 2021-02-19 DIAGNOSIS — O09899 Supervision of other high risk pregnancies, unspecified trimester: Secondary | ICD-10-CM

## 2021-02-19 DIAGNOSIS — F32A Depression, unspecified: Secondary | ICD-10-CM

## 2021-02-19 DIAGNOSIS — M9902 Segmental and somatic dysfunction of thoracic region: Secondary | ICD-10-CM | POA: Diagnosis not present

## 2021-02-19 DIAGNOSIS — D3502 Benign neoplasm of left adrenal gland: Secondary | ICD-10-CM

## 2021-02-19 NOTE — Progress Notes (Addendum)
   PRENATAL VISIT NOTE  Subjective:  Diana Fuentes is a 30 y.o. G2P1001 at 67w5dbeing seen today for ongoing prenatal care.  She is currently monitored for the following issues for this high-risk pregnancy and has Pelvic pain; Supervision of other normal pregnancy, antepartum; Adrenal adenoma, left; Depression; Hx of preeclampsia, prior pregnancy, currently pregnant; Abnormal fetal ultrasound; and Rubella non-immune status, antepartum on their problem list.  Patient reports occasional contractions. Having midback pain from accident several weeks ago. Has been seeing chiropractor (activator method), which she doesn't find helpful. Still having pain.  Contractions: Not present. Vag. Bleeding: None.  Movement: Present. Denies leaking of fluid.   The following portions of the patient's history were reviewed and updated as appropriate: allergies, current medications, past family history, past medical history, past social history, past surgical history and problem list.   Objective:   Vitals:   02/19/21 0900  BP: 111/78  Pulse: 97  Temp: 98.4 F (36.9 C)  Weight: 218 lb (98.9 kg)    Fetal Status: Fetal Heart Rate (bpm): 140   Movement: Present     General:  Alert, oriented and cooperative. Patient is in no acute distress.  Skin: Skin is warm and dry. No rash noted.   Cardiovascular: Normal heart rate noted  Respiratory: Normal respiratory effort, no problems with respiration noted  Abdomen: Soft, gravid, appropriate for gestational age.  Pain/Pressure: Present     Pelvic: Cervical exam deferred        Extremities: Normal range of motion.  Edema: None  Mental Status: Normal mood and affect. Normal behavior. Normal judgment and thought content.   MSK: Restriction, tenderness, tissue texture changes, and paraspinal spasm in the thoracic and cervical spine  Neuro: Moves all four extremities with no focal neurological deficit   OSE: Head OA  SRRL  Cervical C6 SRR  Thoracic T4 FSRR  Rib    Lumbar   Sacrum   Pelvis       Assessment and Plan:  Pregnancy: G2P1001 at 314w5d. [redacted] weeks gestation of pregnancy - Culture, beta strep (group b only) - Cervicovaginal ancillary only( [place])  2. Supervision of other normal pregnancy, antepartum - Culture, beta strep (group b only) - Cervicovaginal ancillary only( [place] 3. 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. Adrenal adenoma, left Imaging postpartum  5. Rubella non-immune status, antepartum MMR postpartum  6. Hx of preeclampsia, prior pregnancy, currently pregnant ASA [id number]. Depression, unspecified depression type controlled  8. Chronic right-sided thoracic back pain 9. Somatic dysfunction of spine, cervical 10. Somatic dysfunction of spine, thoracic 11. Somatic dysfunction of head region 12. Somatic dysfunction of rib OMT done after patient permission. HVLA technique utilized. 4 areas treated with improvement of tissue texture and joint mobility. Patient tolerated procedure well.   Preterm labor symptoms and general obstetric precautions including but not limited to vaginal bleeding, contractions, leaking of fluid and fetal movement were reviewed in detail with the patient. Please refer to After Visit Summary for other counseling recommendations.   No follow-ups on file.  Future Appointments  Date Time Provider DepPeoria/15/2022  8:45 AM StiTruett MainlandO [facility] None  03/06/2021  8:45 AM StiTruett MainlandO [facility] None  03/13/2021  8:45 AM StiNehemiah SettlecTanna SavoyO [facility] None    JacTruett MainlandO

## 2021-02-20 LAB — CERVICOVAGINAL ANCILLARY ONLY
Chlamydia: NEGATIVE
Comment: NEGATIVE
Comment: NEGATIVE
Comment: NORMAL
Neisseria Gonorrhea: NEGATIVE
Trichomonas: NEGATIVE

## 2021-02-23 LAB — CULTURE, BETA STREP (GROUP B ONLY): Strep Gp B Culture: NEGATIVE

## 2021-02-26 ENCOUNTER — Encounter: Payer: 59 | Admitting: Family Medicine

## 2021-03-06 ENCOUNTER — Ambulatory Visit (INDEPENDENT_AMBULATORY_CARE_PROVIDER_SITE_OTHER): Payer: 59 | Admitting: Family Medicine

## 2021-03-06 ENCOUNTER — Other Ambulatory Visit: Payer: Self-pay

## 2021-03-06 VITALS — BP 126/78 | HR 85 | Wt 224.0 lb

## 2021-03-06 DIAGNOSIS — M9901 Segmental and somatic dysfunction of cervical region: Secondary | ICD-10-CM | POA: Diagnosis not present

## 2021-03-06 DIAGNOSIS — M542 Cervicalgia: Secondary | ICD-10-CM

## 2021-03-06 DIAGNOSIS — Z3A37 37 weeks gestation of pregnancy: Secondary | ICD-10-CM

## 2021-03-06 DIAGNOSIS — M9902 Segmental and somatic dysfunction of thoracic region: Secondary | ICD-10-CM

## 2021-03-06 DIAGNOSIS — Z2839 Other underimmunization status: Secondary | ICD-10-CM

## 2021-03-06 DIAGNOSIS — O09293 Supervision of pregnancy with other poor reproductive or obstetric history, third trimester: Secondary | ICD-10-CM

## 2021-03-06 DIAGNOSIS — Z3480 Encounter for supervision of other normal pregnancy, unspecified trimester: Secondary | ICD-10-CM

## 2021-03-06 DIAGNOSIS — Z0289 Encounter for other administrative examinations: Secondary | ICD-10-CM

## 2021-03-06 DIAGNOSIS — O09299 Supervision of pregnancy with other poor reproductive or obstetric history, unspecified trimester: Secondary | ICD-10-CM

## 2021-03-06 DIAGNOSIS — M9900 Segmental and somatic dysfunction of head region: Secondary | ICD-10-CM | POA: Diagnosis not present

## 2021-03-06 DIAGNOSIS — M9908 Segmental and somatic dysfunction of rib cage: Secondary | ICD-10-CM

## 2021-03-06 NOTE — Progress Notes (Signed)
   PRENATAL VISIT NOTE  Subjective:  Diana Fuentes is a 30 y.o. G2P1001 at 32w6dbeing seen today for ongoing prenatal care.  She is currently monitored for the following issues for this high-risk pregnancy and has Pelvic pain; Supervision of other normal pregnancy, antepartum; Adrenal adenoma, left; Depression; Hx of preeclampsia, prior pregnancy, currently pregnant; Abnormal fetal ultrasound; and Rubella non-immune status, antepartum on their problem list.  Patient reports occasional contractions and neck pain improved some from OMT last appt. Having less numbness in right hand. Still present, though .  Contractions: Not present. Vag. Bleeding: None.  Movement: Present. Denies leaking of fluid.   The following portions of the patient's history were reviewed and updated as appropriate: allergies, current medications, past family history, past medical history, past social history, past surgical history and problem list. Problem list updated.  Objective:   Vitals:   03/06/21 0855  BP: 126/78  Pulse: 85  Weight: 224 lb (101.6 kg)    Fetal Status: Fetal Heart Rate (bpm): 144   Movement: Present     General:  Alert, oriented and cooperative. Patient is in no acute distress.  Skin: Skin is warm and dry. No rash noted.   Cardiovascular: Normal heart rate noted  Respiratory: Normal respiratory effort, no problems with respiration noted  Abdomen: Soft, gravid, appropriate for gestational age. Pain/Pressure: Present     Pelvic:  Cervical exam performed 3/50/-3       MSK: Restriction, tenderness, tissue texture changes, and paraspinal spasm in the upper thoracic and cervical spine  Neuro: Moves all four extremities with no focal neurological deficit  Extremities: Normal range of motion.  Edema: Trace  Mental Status: Normal mood and affect. Normal behavior. Normal judgment and thought content.   OSE: Head OA SRRL  Cervical C6 SRR  Thoracic T4 FSRR  Rib   Lumbar   Sacrum   Pelvis      Assessment and Plan:  Pregnancy: G2P1001 at 376w6d1. Supervision of other normal pregnancy, antepartum FHT and FH normal  2. Hx of preeclampsia, prior pregnancy, currently pregnant On ASA. BP normal  3. Rubella non-immune status, antepartum MMR post delivery  4. [redacted] weeks gestation of pregnancy   5. Neck pain 6. Somatic dysfunction of head region 7. Somatic dysfunction of spine, cervical 8. Somatic dysfunction of spine, thoracic OMT done after patient permission. HVLA technique utilized. 3 areas treated with improvement of tissue texture and joint mobility. Patient tolerated procedure well.     Term labor symptoms and general obstetric precautions including but not limited to vaginal bleeding, contractions, leaking of fluid and fetal movement were reviewed in detail with the patient. Please refer to After Visit Summary for other counseling recommendations.  No follow-ups on file.  StTruett MainlandDO

## 2021-03-11 ENCOUNTER — Encounter: Payer: Self-pay | Admitting: General Practice

## 2021-03-13 ENCOUNTER — Other Ambulatory Visit: Payer: Self-pay

## 2021-03-13 ENCOUNTER — Ambulatory Visit (INDEPENDENT_AMBULATORY_CARE_PROVIDER_SITE_OTHER): Payer: 59 | Admitting: Family Medicine

## 2021-03-13 VITALS — BP 133/72 | HR 86 | Wt 224.0 lb

## 2021-03-13 DIAGNOSIS — Z2839 Other underimmunization status: Secondary | ICD-10-CM

## 2021-03-13 DIAGNOSIS — O283 Abnormal ultrasonic finding on antenatal screening of mother: Secondary | ICD-10-CM

## 2021-03-13 DIAGNOSIS — O09899 Supervision of other high risk pregnancies, unspecified trimester: Secondary | ICD-10-CM

## 2021-03-13 DIAGNOSIS — O09299 Supervision of pregnancy with other poor reproductive or obstetric history, unspecified trimester: Secondary | ICD-10-CM

## 2021-03-13 DIAGNOSIS — D3502 Benign neoplasm of left adrenal gland: Secondary | ICD-10-CM

## 2021-03-13 DIAGNOSIS — Z3480 Encounter for supervision of other normal pregnancy, unspecified trimester: Secondary | ICD-10-CM

## 2021-03-13 NOTE — Progress Notes (Signed)
+   fetal movement. No complaints.

## 2021-03-13 NOTE — Progress Notes (Signed)
   PRENATAL VISIT NOTE  Subjective:  Jacky Hartung is a 30 y.o. G2P1001 at [redacted]w[redacted]d being seen today for ongoing prenatal care.  She is currently monitored for the following issues for this low-risk pregnancy and has Pelvic pain; Supervision of other normal pregnancy, antepartum; Adrenal adenoma, left; Depression; Hx of preeclampsia, prior pregnancy, currently pregnant; Abnormal fetal ultrasound; and Rubella non-immune status, antepartum on their problem list.  Patient reports no complaints.  Contractions: Irritability. Vag. Bleeding: None.  Movement: Present. Denies leaking of fluid.   The following portions of the patient's history were reviewed and updated as appropriate: allergies, current medications, past family history, past medical history, past social history, past surgical history and problem list.   Objective:   Vitals:   03/13/21 0849  BP: 133/72  Pulse: 86  Weight: 224 lb (101.6 kg)    Fetal Status: Fetal Heart Rate (bpm): 138 Fundal Height: 38 cm Movement: Present  Presentation: Vertex  General:  Alert, oriented and cooperative. Patient is in no acute distress.  Skin: Skin is warm and dry. No rash noted.   Cardiovascular: Normal heart rate noted  Respiratory: Normal respiratory effort, no problems with respiration noted  Abdomen: Soft, gravid, appropriate for gestational age.  Pain/Pressure: Absent     Pelvic: Cervical exam deferred Dilation: 3.5 Effacement (%): 50 Station: -3  Extremities: Normal range of motion.  Edema: Trace  Mental Status: Normal mood and affect. Normal behavior. Normal judgment and thought content.   Assessment and Plan:  Pregnancy: G2P1001 at [redacted]w[redacted]d 1. Supervision of other normal pregnancy, antepartum FHT and FH normal  2. Rubella non-immune status, antepartum MMR post delivery  3. Hx of preeclampsia, prior pregnancy, currently pregnant ASA $RemoveBefo'81mg'[id number]$   4. Adrenal adenoma, left Imaging post delivery  5. Abnormal fetal ultrasound Right sided  aortic arch.   Term labor symptoms and general obstetric precautions including but not limited to vaginal bleeding, contractions, leaking of fluid and fetal movement were reviewed in detail with the patient. Please refer to After Visit Summary for other counseling recommendations.   No follow-ups on file.  No future appointments.  Truett Mainland, DO

## 2021-03-14 DIAGNOSIS — Z419 Encounter for procedure for purposes other than remedying health state, unspecified: Secondary | ICD-10-CM | POA: Diagnosis not present

## 2021-03-27 ENCOUNTER — Ambulatory Visit (INDEPENDENT_AMBULATORY_CARE_PROVIDER_SITE_OTHER): Payer: 59 | Admitting: Family Medicine

## 2021-03-27 ENCOUNTER — Encounter (HOSPITAL_COMMUNITY): Payer: Self-pay | Admitting: Family Medicine

## 2021-03-27 ENCOUNTER — Telehealth (HOSPITAL_COMMUNITY): Payer: Self-pay | Admitting: *Deleted

## 2021-03-27 ENCOUNTER — Other Ambulatory Visit: Payer: Self-pay

## 2021-03-27 ENCOUNTER — Inpatient Hospital Stay (HOSPITAL_COMMUNITY)
Admission: AD | Admit: 2021-03-27 | Discharge: 2021-03-29 | DRG: 807 | Disposition: A | Discharge status: Home / Self Care | Payer: 59 | Attending: Obstetrics and Gynecology | Admitting: Obstetrics and Gynecology

## 2021-03-27 ENCOUNTER — Encounter (HOSPITAL_COMMUNITY): Payer: Self-pay | Admitting: Anesthesiology

## 2021-03-27 VITALS — BP 128/87 | HR 68 | Wt 221.0 lb

## 2021-03-27 DIAGNOSIS — O09899 Supervision of other high risk pregnancies, unspecified trimester: Secondary | ICD-10-CM

## 2021-03-27 DIAGNOSIS — D3502 Benign neoplasm of left adrenal gland: Secondary | ICD-10-CM | POA: Diagnosis present

## 2021-03-27 DIAGNOSIS — M9900 Segmental and somatic dysfunction of head region: Secondary | ICD-10-CM | POA: Diagnosis not present

## 2021-03-27 DIAGNOSIS — M9901 Segmental and somatic dysfunction of cervical region: Secondary | ICD-10-CM

## 2021-03-27 DIAGNOSIS — O09299 Supervision of pregnancy with other poor reproductive or obstetric history, unspecified trimester: Secondary | ICD-10-CM

## 2021-03-27 DIAGNOSIS — Z3480 Encounter for supervision of other normal pregnancy, unspecified trimester: Secondary | ICD-10-CM

## 2021-03-27 DIAGNOSIS — Z3A40 40 weeks gestation of pregnancy: Secondary | ICD-10-CM | POA: Diagnosis not present

## 2021-03-27 DIAGNOSIS — Z8659 Personal history of other mental and behavioral disorders: Secondary | ICD-10-CM | POA: Diagnosis present

## 2021-03-27 DIAGNOSIS — F32A Depression, unspecified: Secondary | ICD-10-CM | POA: Diagnosis present

## 2021-03-27 DIAGNOSIS — Z3490 Encounter for supervision of normal pregnancy, unspecified, unspecified trimester: Secondary | ICD-10-CM | POA: Diagnosis present

## 2021-03-27 DIAGNOSIS — O358XX0 Maternal care for other (suspected) fetal abnormality and damage, not applicable or unspecified: Secondary | ICD-10-CM | POA: Diagnosis present

## 2021-03-27 DIAGNOSIS — Z87891 Personal history of nicotine dependence: Secondary | ICD-10-CM | POA: Diagnosis not present

## 2021-03-27 DIAGNOSIS — O368330 Maternal care for abnormalities of the fetal heart rate or rhythm, third trimester, not applicable or unspecified: Secondary | ICD-10-CM

## 2021-03-27 DIAGNOSIS — Z20822 Contact with and (suspected) exposure to covid-19: Secondary | ICD-10-CM | POA: Diagnosis present

## 2021-03-27 DIAGNOSIS — M9902 Segmental and somatic dysfunction of thoracic region: Secondary | ICD-10-CM | POA: Diagnosis not present

## 2021-03-27 DIAGNOSIS — O283 Abnormal ultrasonic finding on antenatal screening of mother: Secondary | ICD-10-CM | POA: Diagnosis present

## 2021-03-27 DIAGNOSIS — Z2839 Other underimmunization status: Secondary | ICD-10-CM

## 2021-03-27 DIAGNOSIS — M542 Cervicalgia: Secondary | ICD-10-CM

## 2021-03-27 DIAGNOSIS — O134 Gestational [pregnancy-induced] hypertension without significant proteinuria, complicating childbirth: Secondary | ICD-10-CM | POA: Diagnosis not present

## 2021-03-27 DIAGNOSIS — O480 Post-term pregnancy: Secondary | ICD-10-CM

## 2021-03-27 DIAGNOSIS — O139 Gestational [pregnancy-induced] hypertension without significant proteinuria, unspecified trimester: Secondary | ICD-10-CM

## 2021-03-27 LAB — CBC
HCT: 36.5 % (ref 36.0–46.0)
Hemoglobin: 11.7 g/dL — ABNORMAL LOW (ref 12.0–15.0)
MCH: 29.8 pg (ref 26.0–34.0)
MCHC: 32.1 g/dL (ref 30.0–36.0)
MCV: 92.9 fL (ref 80.0–100.0)
Platelets: 317 10*3/uL (ref 150–400)
RBC: 3.93 MIL/uL (ref 3.87–5.11)
RDW: 13.5 % (ref 11.5–15.5)
WBC: 14.1 10*3/uL — ABNORMAL HIGH (ref 4.0–10.5)
nRBC: 0 % (ref 0.0–0.2)

## 2021-03-27 LAB — TYPE AND SCREEN
ABO/RH(D): A POS
Antibody Screen: NEGATIVE

## 2021-03-27 LAB — RESP PANEL BY RT-PCR (FLU A&B, COVID) ARPGX2
Influenza A by PCR: NEGATIVE
Influenza B by PCR: NEGATIVE
SARS Coronavirus 2 by RT PCR: NEGATIVE

## 2021-03-27 MED ORDER — ONDANSETRON HCL 4 MG/2ML IJ SOLN
4.0000 mg | Freq: Four times a day (QID) | INTRAMUSCULAR | Status: DC | PRN
  Administered 2021-03-27: 4 mg via INTRAVENOUS
  Filled 2021-03-27: qty 2

## 2021-03-27 MED ORDER — TETANUS-DIPHTH-ACELL PERTUSSIS 5-2.5-18.5 LF-MCG/0.5 IM SUSY: 0.5000 mL | PREFILLED_SYRINGE | Freq: Once | INTRAMUSCULAR | Status: DC

## 2021-03-27 MED ORDER — LIDOCAINE HCL (PF) 1 % IJ SOLN: 30.0000 mL | INTRAMUSCULAR | Status: DC | PRN

## 2021-03-27 MED ORDER — OXYTOCIN BOLUS FROM INFUSION
333.0000 mL | Freq: Once | INTRAVENOUS | Status: AC
  Administered 2021-03-27: 333 mL via INTRAVENOUS

## 2021-03-27 MED ORDER — DIPHENHYDRAMINE HCL 50 MG/ML IJ SOLN: 12.5000 mg | INTRAMUSCULAR | Status: DC | PRN

## 2021-03-27 MED ORDER — PHENYLEPHRINE 40 MCG/ML (10ML) SYRINGE FOR IV PUSH (FOR BLOOD PRESSURE SUPPORT): 80.0000 ug | PREFILLED_SYRINGE | INTRAVENOUS | Status: DC | PRN

## 2021-03-27 MED ORDER — LACTATED RINGERS IV SOLN: INTRAVENOUS | Status: DC

## 2021-03-27 MED ORDER — MEASLES, MUMPS & RUBELLA VAC IJ SOLR: 0.5000 mL | Freq: Once | INTRAMUSCULAR | Status: DC

## 2021-03-27 MED ORDER — EPHEDRINE 5 MG/ML INJ: 10.0000 mg | INTRAVENOUS | Status: DC | PRN

## 2021-03-27 MED ORDER — ACETAMINOPHEN 325 MG PO TABS: 650.0000 mg | ORAL_TABLET | ORAL | Status: DC | PRN

## 2021-03-27 MED ORDER — LACTATED RINGERS AMNIOINFUSION: INTRAVENOUS | Status: DC

## 2021-03-27 MED ORDER — FENTANYL-BUPIVACAINE-NACL 0.5-0.125-0.9 MG/250ML-% EP SOLN
12.0000 mL/h | EPIDURAL | Status: DC | PRN
  Filled 2021-03-27: qty 250

## 2021-03-27 MED ORDER — TERBUTALINE SULFATE 1 MG/ML IJ SOLN
INTRAMUSCULAR | Status: AC
  Filled 2021-03-27: qty 1

## 2021-03-27 MED ORDER — COCONUT OIL OIL: 1.0000 "application " | TOPICAL_OIL | Status: DC | PRN

## 2021-03-27 MED ORDER — SENNOSIDES-DOCUSATE SODIUM 8.6-50 MG PO TABS
2.0000 | ORAL_TABLET | ORAL | Status: DC
  Administered 2021-03-28 – 2021-03-29 (×2): 2 via ORAL
  Filled 2021-03-27 (×2): qty 2

## 2021-03-27 MED ORDER — OXYCODONE-ACETAMINOPHEN 5-325 MG PO TABS
1.0000 | ORAL_TABLET | ORAL | Status: DC | PRN
Start: 2021-03-27 — End: 2021-03-27

## 2021-03-27 MED ORDER — OXYTOCIN-SODIUM CHLORIDE 30-0.9 UT/500ML-% IV SOLN
1.0000 m[IU]/min | INTRAVENOUS | Status: DC
Start: 2021-03-27 — End: 2021-03-27
  Administered 2021-03-27: 1 m[IU]/min via INTRAVENOUS
  Filled 2021-03-27: qty 500

## 2021-03-27 MED ORDER — TERBUTALINE SULFATE 1 MG/ML IJ SOLN
0.2500 mg | Freq: Once | INTRAMUSCULAR | Status: AC | PRN
  Administered 2021-03-27: 0.25 mg via SUBCUTANEOUS

## 2021-03-27 MED ORDER — SIMETHICONE 80 MG PO CHEW: 80.0000 mg | CHEWABLE_TABLET | ORAL | Status: DC | PRN

## 2021-03-27 MED ORDER — FENTANYL CITRATE (PF) 100 MCG/2ML IJ SOLN
100.0000 ug | INTRAMUSCULAR | Status: DC | PRN
  Administered 2021-03-27: 100 ug via INTRAVENOUS
  Filled 2021-03-27 (×2): qty 2

## 2021-03-27 MED ORDER — OXYCODONE-ACETAMINOPHEN 5-325 MG PO TABS: 2.0000 | ORAL_TABLET | ORAL | Status: DC | PRN

## 2021-03-27 MED ORDER — SOD CITRATE-CITRIC ACID 500-334 MG/5ML PO SOLN: 30.0000 mL | ORAL | Status: DC | PRN

## 2021-03-27 MED ORDER — ONDANSETRON HCL 4 MG PO TABS: 4.0000 mg | ORAL_TABLET | ORAL | Status: DC | PRN

## 2021-03-27 MED ORDER — LACTATED RINGERS IV SOLN
500.0000 mL | INTRAVENOUS | Status: DC | PRN
Start: 2021-03-27 — End: 2021-03-27

## 2021-03-27 MED ORDER — OXYTOCIN-SODIUM CHLORIDE 30-0.9 UT/500ML-% IV SOLN: 2.5000 [IU]/h | INTRAVENOUS | Status: DC

## 2021-03-27 MED ORDER — LACTATED RINGERS IV SOLN: 500.0000 mL | Freq: Once | INTRAVENOUS | Status: DC

## 2021-03-27 MED ORDER — BENZOCAINE-MENTHOL 20-0.5 % EX AERO
1.0000 "application " | INHALATION_SPRAY | CUTANEOUS | Status: DC | PRN
  Administered 2021-03-27: 1 via TOPICAL
  Filled 2021-03-27: qty 56

## 2021-03-27 MED ORDER — IBUPROFEN 600 MG PO TABS
600.0000 mg | ORAL_TABLET | Freq: Four times a day (QID) | ORAL | Status: DC
  Administered 2021-03-27 – 2021-03-29 (×7): 600 mg via ORAL
  Filled 2021-03-27 (×7): qty 1

## 2021-03-27 MED ORDER — WITCH HAZEL-GLYCERIN EX PADS: 1.0000 "application " | MEDICATED_PAD | CUTANEOUS | Status: DC | PRN

## 2021-03-27 MED ORDER — ONDANSETRON HCL 4 MG/2ML IJ SOLN: 4.0000 mg | INTRAMUSCULAR | Status: DC | PRN

## 2021-03-27 MED ORDER — DIPHENHYDRAMINE HCL 25 MG PO CAPS: 25.0000 mg | ORAL_CAPSULE | Freq: Four times a day (QID) | ORAL | Status: DC | PRN

## 2021-03-27 MED ORDER — PRENATAL MULTIVITAMIN CH
1.0000 | ORAL_TABLET | Freq: Every day | ORAL | Status: DC
  Administered 2021-03-28 – 2021-03-29 (×2): 1 via ORAL
  Filled 2021-03-27 (×2): qty 1

## 2021-03-27 MED ORDER — DIBUCAINE (PERIANAL) 1 % EX OINT: 1.0000 "application " | TOPICAL_OINTMENT | CUTANEOUS | Status: DC | PRN

## 2021-03-27 NOTE — H&P (Addendum)
OBSTETRIC ADMISSION HISTORY AND PHYSICAL  Diana Fuentes is a 30 y.o. female G2P1001 with IUP at [redacted]w[redacted]d by LMP presenting for NRNST  She reports +FMs, No LOF, no VB, no blurry vision, headaches or peripheral edema, and RUQ pain.  She plans on breast feeding. She request POPs for birth control.  She received her prenatal care at  [facility]    Dating: By LMP --->  Estimated Date of Delivery: 03/21/21  Sono:  @[redacted]w[redacted]d , CWD, right sided aortic arch, cephalic presentation, anterior placenta, 2050g, 34%% EFW  Prenatal History/Complications: Hx of preE in previous pregnancy Right sided fetal aortic arch- needs ECHO in 1st month of life  Rubella Non immune Adrenal Adenoma  Past Medical History: Past Medical History:  Diagnosis Date   Adrenal adenoma    Anxiety    Chlamydia    Depression    Ear infection     Past Surgical History: Past Surgical History:  Procedure Laterality Date   LAPAROSCOPIC LYSIS OF ADHESIONS N/A 11/02/2018   Procedure: LAPAROSCOPIC LYSIS OF ADHESIONS;  Surgeon: Emily Filbert, MD;  Location: [facility];  Service: Gynecology;  Laterality: N/A;   LAPAROSCOPY N/A 11/02/2018   Procedure: LAPAROSCOPY DIAGNOSTIC;  Surgeon: Emily Filbert, MD;  Location: [facility];  Service: Gynecology;  Laterality: N/A;   WISDOM TOOTH EXTRACTION      Obstetrical History: OB History     Gravida  2   Para  1   Term  1   Preterm      AB      Living  1      SAB      IAB      Ectopic      Multiple      Live Births  1           Social History Social History   Socioeconomic History   Marital status: Legally Separated    Spouse name: Not on file   Number of children: 1   Years of education: Not on file   Highest education level: High school graduate  Occupational History   Not on file  Tobacco Use   Smoking status: Former   Smokeless tobacco: Current   Tobacco comments:    vape  Scientific laboratory technician Use: Former   Substances:  Flavoring  Substance and Sexual Activity   Alcohol use: Not Currently    Comment: occ   Drug use: Not Currently    Types: Marijuana   Sexual activity: Yes    Birth control/protection: None  Other Topics Concern   Not on file  Social History Narrative   Not on file   Social Determinants of Health   Financial Resource Strain: Not on file  Food Insecurity: Not on file  Transportation Needs: Not on file  Physical Activity: Not on file  Stress: Not on file  Social Connections: Not on file    Family History: Family History  Problem Relation Age of Onset   Hypertension Father    Diabetes Maternal Aunt    Diabetes Maternal Uncle    Diabetes Maternal Grandmother     Allergies: Allergies  Allergen Reactions   Shellfish Allergy Rash and Shortness Of Breath    Medications Prior to Admission  Medication Sig Dispense Refill Last Dose   Prenatal Vit-Fe Fumarate-FA (PRENATAL VITAMINS PO) Take by mouth.   03/27/2021    PE Blood pressure 114/61, pulse 74, temperature 98.4 F (36.9 C), temperature source Oral, last menstrual  period 06/14/2020. General appearance: alert and cooperative HEENT: [place]/AT, MMM Lungs: CTAB Heart: RRR Abdomen: soft, non-tender; bowel sounds normal, gravid  Extremities: Homans sign is negative, no sign of DVT  Pelvic: Dilation: 6.5 Effacement (%): 60 Station: -1 Exam by:: Colman Cater, CNM Presentation: cephalic by cervical exam   Fetal monitoring baseline 140, mod variability, accels present, some variables, early decels, rare late Uterine activityFrequency: Every 2-4 minutes  Prenatal labs: ABO, Rh: --/--/A POS (07/14 1308) Antibody: NEG (07/14 1308) Rubella: <0.90 (12/07 1101) RPR: Non Reactive (04/13 0847)  HBsAg: Negative (12/07 1101)  HIV: Non Reactive (04/13 0847)  GBS: Negative/-- (06/08 1003)  Flu: declined Tdap: declined Pap: NILM 02/2021 2 hr Glucola passed  Genetic screening  declined NIPS Anatomy US: right sided aortic  arch  Prenatal Transfer Tool  Maternal Diabetes: No Genetic Screening: Abnormal:  Results: Other: right sided aortic arch  Maternal Ultrasounds/Referrals: Cardiac defect Fetal Ultrasounds or other Referrals:  Referred to Materal Fetal Medicine  Maternal Substance Abuse:  No Significant Maternal Medications:  None Significant Maternal Lab Results: None and Group B Strep negative  Results for orders placed or performed during the hospital encounter of 03/27/21 (from the past 24 hour(s))  CBC   Collection Time: 03/27/21  1:00 PM  Result Value Ref Range   WBC 14.1 (H) 4.0 - 10.5 K/uL   RBC 3.93 3.87 - 5.11 MIL/uL   Hemoglobin 11.7 (L) 12.0 - 15.0 g/dL   HCT 36.5 36.0 - 46.0 %   MCV 92.9 80.0 - 100.0 fL   MCH 29.8 26.0 - 34.0 pg   MCHC 32.1 30.0 - 36.0 g/dL   RDW 13.5 11.5 - 15.5 %   Platelets 317 150 - 400 K/uL   nRBC 0.0 0.0 - 0.2 %  Type and screen   Collection Time: 03/27/21  1:08 PM  Result Value Ref Range   ABO/RH(D) A POS    Antibody Screen NEG    Sample Expiration      03/30/2021,2359 Performed at [facility], [address], [place], [place] [address]     Patient Active Problem List   Diagnosis Date Noted   Encounter for induction of labor 03/27/2021   Rubella non-immune status, antepartum 11/27/2020   Abnormal fetal ultrasound 10/31/2020   Supervision of other normal pregnancy, antepartum 08/20/2020   Hx of preeclampsia, prior pregnancy, currently pregnant 08/20/2020   Pelvic pain 07/26/2018   Adrenal adenoma, left 01/11/2018   Depression 08/18/2014    Assessment/Plan:  Diana Fuentes is a 30 y.o. G2P1001 at [redacted]w[redacted]d here for NRNST  #IOL for NRNST  --AROM with mec @ 1430 Regular ctx now, Cat II strip Progressing well, may start Pit  Will monitor fetal wellbeing, consider amnio (suspect low AFI)  #Pain: No meds #FWB: Cat II #GBS:  neg #MOF: breast #MOC: POPs #Circ:  Yes   Diana Gemma MD PGY3 03/27/2021, 2:49 PM   Midwife  attestation: I have seen and examined this patient; I agree with above documentation in the resident's note.   PE: Gen: calm comfortable, NAD Resp: normal effort and rate Abd: gravid  ROS, labs, PMH reviewed  Assessment/Plan: [redacted] weeks gestation Labor: latent FWB: Cat II GBS: neg Admit to LD AROM for IOL Anticipate SVD  Julianne Handler, CNM  03/27/2021, 3:07 PM

## 2021-03-27 NOTE — Progress Notes (Addendum)
   PRENATAL VISIT NOTE  Subjective:  Diana Fuentes is a 30 y.o. G2P1001 at 48w6dbeing seen today for ongoing prenatal care.  She is currently monitored for the following issues for this high-risk pregnancy and has Pelvic pain; Supervision of other normal pregnancy, antepartum; Adrenal adenoma, left; Depression; Hx of preeclampsia, prior pregnancy, currently pregnant; Abnormal fetal ultrasound; and Rubella non-immune status, antepartum on their problem list.  Patient reports no complaints.  Contractions: Irregular. Vag. Bleeding: None.  Movement: Present. Denies leaking of fluid.   The following portions of the patient's history were reviewed and updated as appropriate: allergies, current medications, past family history, past medical history, past social history, past surgical history and problem list. Problem list updated.  Objective:   Vitals:   03/27/21 0837 03/27/21 0842  BP: (!) 146/86 128/87  Pulse: 72 68  Weight: 221 lb (100.2 kg)     Fetal Status: Fetal Heart Rate (bpm): 140   Movement: Present     General:  Alert, oriented and cooperative. Patient is in no acute distress.  Skin: Skin is warm and dry. No rash noted.   Cardiovascular: Normal heart rate noted  Respiratory: Normal respiratory effort, no problems with respiration noted  Abdomen: Soft, gravid, appropriate for gestational age. Pain/Pressure: Present     Pelvic:  Cervical exam performed        MSK: Restriction, tenderness, tissue texture changes, and paraspinal spasm in the thoracic spine  Neuro: Moves all four extremities with no focal neurological deficit  Extremities: Normal range of motion.  Edema: Trace  Mental Status: Normal mood and affect. Normal behavior. Normal judgment and thought content.   OSE: Head OA SRL  Cervical C5 SRR  Thoracic T8 FSRR  Rib   Lumbar   Sacrum   Pelvis     Assessment and Plan:  Pregnancy: G2P1001 at 495w6d1. [redacted] weeks gestation of pregnancy  2. Supervision of other  normal pregnancy, antepartum FHT and FH normal. Induction at 41 weeks  3. Neck pain 4. Somatic dysfunction of head region 5. Somatic dysfunction of spine, cervical 6. Somatic dysfunction of spine, thoracic OMT done after patient permission. HVLA technique utilized. 3 areas treated with improvement of tissue texture and joint mobility. Patient tolerated procedure well.    7. Rubella non-immune status, antepartum MMR post delivery  8. Abnormal fetal ultrasound Right sided aortic arch  9. Adrenal adenoma, left   Preterm labor symptoms and general obstetric precautions including but not limited to vaginal bleeding, contractions, leaking of fluid and fetal movement were reviewed in detail with the patient. Please refer to After Visit Summary for other counseling recommendations.  No follow-ups on file.  StTruett MainlandDO  NST obtained, showing FHR in the 120-130s, moderate variability. No accels and a couple of late decels. I discussed patient with L&D charge nurse, Neonatologist. Patient okay to go to labor floor for induction. Patient will go directly there. Labor team notified.  JaTruett MainlandDO 03/27/2021

## 2021-03-27 NOTE — Consult Note (Signed)
  Called to Room 206 to attend this vaginal delivery for MSAF.  Infant came ut crying vigorously and delivery team was excused by Dr. Ilda Basset.    Amedeo Gory, MD (Attending Neonatologist)

## 2021-03-27 NOTE — Progress Notes (Addendum)
Labor Progress Note Mayelin Panos is a 30 y.o. G2P1001 at [redacted]w[redacted]d presented for IOL for NRNST Called to room for FHR decel.  S: Hands and knee, feeling contractions more, breathing through, considering epidural   O:  BP 128/66   Pulse 80   Temp 98.4 F (36.9 C) (Oral)   LMP 06/14/2020  EFM: baseline 120 bpm/ mod variability/ recurrent deep variables  IUPC: q2-89min   SVE: Dilation: 8 Effacement (%): 90 Station: 0 Exam by:: H.Price, RN  A/P: 30 y.o. G2P1001 [redacted]w[redacted]d   #IOL for NRNST --AROM with mec @ 1430 --IUPC, amnio, FSE --Pit off now 2/2 Cat II strip  --LR bolus Concern for fetal intolerance w/ recurrent deep variables. Will try position changes, fluid bolus, d/c pit  #Pain:  IV, considering epidural  #FWB: Cat II #GBS:  neg #MOF: breast #MOC: POPs #Circ:   Yes   Maury Dus, MD 7:50 PM  Midwife attestation I agree with the documentation in the resident's note.   Julianne Handler, CNM 8:16 PM

## 2021-03-27 NOTE — Progress Notes (Signed)
Labor Progress Note Diana Fuentes is a 30 y.o. G2P1001 at [redacted]w[redacted]d presented for IOL for NRNST Called to room for FHR decel.  S:  Pt comfortable in rt lateral.   O:  BP 114/61 (BP Location: Left Arm)   Pulse 74   Temp 98.4 F (36.9 C) (Oral)   LMP 06/14/2020  EFM: baseline 135 bpm/ mod variability/ no accels/ prolonged decels  Toco/IUPC: 2-4 SVE: Dilation: 6.5 Effacement (%): 60 Station: -1 Exam by:: Colman Cater, CNM  A/P: 30 y.o. G2P1001 [redacted]w[redacted]d  1. Labor: latent 2. FWB: Cat II 3. Pain: analgesia/anesthesia/NO prn  FHR decel x7-8 min, maternal repositioning into H&K and Terbutaline given. Good return to baseline. Will monitor closely. Dr. Ernestina Patches at bedside.  Julianne Handler, CNM 3:39 PM

## 2021-03-27 NOTE — Anesthesia Preprocedure Evaluation (Deleted)
Anesthesia Evaluation  Patient identified by MRN, date of birth, ID band Patient awake    Reviewed: Allergy & Precautions, NPO status , Patient's Chart, lab work & pertinent test results  History of Anesthesia Complications Negative for: history of anesthetic complications  Airway Mallampati: II  TM Distance: >3 FB Neck ROM: Full    Dental no notable dental hx. (+) Teeth Intact   Pulmonary former smoker,    Pulmonary exam normal breath sounds clear to auscultation       Cardiovascular negative cardio ROS Normal cardiovascular exam Rhythm:Regular Rate:Normal     Neuro/Psych PSYCHIATRIC DISORDERS Anxiety Depression negative neurological ROS     GI/Hepatic negative GI ROS, Neg liver ROS,   Endo/Other  Morbid obesity  Renal/GU negative Renal ROS     Musculoskeletal   Abdominal   Peds  Hematology   Anesthesia Other Findings   Reproductive/Obstetrics (+) Pregnancy                             Anesthesia Physical  Anesthesia Plan  ASA: II  Anesthesia Plan: Epidural   Post-op Pain Management:    Induction: Intravenous  PONV Risk Score and Plan: 3 and Treatment may vary due to age or medical condition  Airway Management Planned:   Additional Equipment: None  Intra-op Plan:   Post-operative Plan:   Informed Consent: I have reviewed the patients History and Physical, chart, labs and discussed the procedure including the risks, benefits and alternatives for the proposed anesthesia with the patient or authorized representative who has indicated his/her understanding and acceptance.     Dental advisory given  Plan Discussed with: CRNA and Surgeon  Anesthesia Plan Comments:         Anesthesia Quick Evaluation

## 2021-03-27 NOTE — Discharge Summary (Addendum)
Postpartum Discharge Summary     Patient Name: Diana Fuentes DOB: 12/05/1990 MRN: [id number]  Date of admission: 03/27/2021 Delivery date:03/27/2021  Delivering provider: Arrie Senate  Date of discharge: 03/29/2021  Admitting diagnosis: Encounter for induction of labor [Z34.90] Intrauterine pregnancy: [redacted]w[redacted]d    Secondary diagnosis:  Active Problems:   Supervision of other normal pregnancy, antepartum   Adrenal adenoma, left   Depression   Hx of preeclampsia, prior pregnancy, currently pregnant   Abnormal fetal ultrasound   Rubella non-immune status, antepartum   Encounter for induction of labor   Vacuum-assisted vaginal delivery   Gestational hypertension  Additional problems: none    Discharge diagnosis: Term Pregnancy Delivered   Intermittent Hypertension                                         Post partum procedures: none Augmentation: AROM and Pitocin Complications: None  Hospital course: Induction of Labor With Vaginal Delivery   30y.o. yo G2P1001 at 463w6das admitted to the hospital 03/27/2021 for induction of labor.  Indication for induction:  NRNST .  Patient had an uncomplicated labor course as follows: Membrane Rupture Time/Date: 2:38 PM ,03/27/2021   Delivery Method:Vaginal, Vacuum (Extractor)  Episiotomy: None  Lacerations:  Labial  Details of delivery can be found in separate delivery note.  Patient had a routine postpartum course. Patient is discharged home 03/29/21.  Newborn Data: Birth date:03/27/2021  Birth time:8:32 PM  Gender:Female  Living status:Living  Apgars:8 ,9  Weight:3705 g   Magnesium Sulfate received: No BMZ received: No Rhophylac:N/A MMR: to be given postpartum T-DaP:Given prenatallyneeds postpartum Flu: N/A Transfusion:No  Physical exam  Vitals:   03/28/21 0327 03/28/21 0930 03/28/21 1931 03/29/21 0602  BP: 130/71 128/89 113/74 (!) 128/92  Pulse: 84 66 66 65  Resp: '18 18 18 16  ' Temp: 98.2 F (36.8 C) 98.4 F (36.9 C)  98.4 F (36.9 C) 97.8 F (36.6 C)  TempSrc: Oral Oral Oral Oral  SpO2: 98%  98% 99%   General: alert, cooperative, and no distress Lochia: appropriate Uterine Fundus: firm Incision: N/A DVT Evaluation: No evidence of DVT seen on physical exam. Labs: Lab Results  Component Value Date   WBC 14.1 (H) 03/27/2021   HGB 11.7 (L) 03/27/2021   HCT 36.5 03/27/2021   MCV 92.9 03/27/2021   PLT 317 03/27/2021   CMP Latest Ref Rng & Units 09/24/2020  Glucose 70 - 99 mg/dL 115(H)  BUN 6 - 20 mg/dL 7  Creatinine 0.44 - 1.00 mg/dL 0.48  Sodium 135 - 145 mmol/L 136  Potassium 3.5 - 5.1 mmol/L 3.4(L)  Chloride 98 - 111 mmol/L 103  CO2 22 - 32 mmol/L 20(L)  Calcium 8.9 - 10.3 mg/dL 8.9  Total Protein 6.5 - 8.1 g/dL 7.5  Total Bilirubin 0.3 - 1.2 mg/dL 0.8  Alkaline Phos 38 - 126 U/L 42  AST 15 - 41 U/L 18  ALT 0 - 44 U/L 24   Edinburgh Score: Edinburgh Postnatal Depression Scale Screening Tool 03/28/2021  I have been able to laugh and see the funny side of things. 0  I have looked forward with enjoyment to things. 0  I have blamed myself unnecessarily when things went wrong. 0  I have been anxious or worried for no good reason. 0  I have felt scared or panicky for no good reason. 0  Things have been getting on top of me. 1  I have been so unhappy that I have had difficulty sleeping. 0  I have felt sad or miserable. 0  I have been so unhappy that I have been crying. 0  The thought of harming myself has occurred to me. 0  Edinburgh Postnatal Depression Scale Total 1     After visit meds:  Allergies as of 03/29/2021       Reactions   Shellfish Allergy Rash, Shortness Of Breath        Medication List     TAKE these medications    amLODipine 5 MG tablet Commonly known as: NORVASC Take 1 tablet (5 mg total) by mouth daily.   ibuprofen 600 MG tablet Commonly known as: ADVIL Take 1 tablet (600 mg total) by mouth every 6 (six) hours.   norethindrone 0.35 MG tablet Commonly  known as: Ortho Micronor Take 1 tablet (0.35 mg total) by mouth daily.   PRENATAL VITAMINS PO Take by mouth.         Discharge home in stable condition Infant Feeding: Breast Infant Disposition:home with mother Discharge instruction: per After Visit Summary and Postpartum booklet. Activity: Advance as tolerated. Pelvic rest for 6 weeks.  Diet: routine diet Future Appointments: Future Appointments  Date Time Provider [place]  04/07/2021  1:30 PM [facility] NURSE [facility] None  05/02/2021 10:35 AM Nehemiah Settle, Tanna Savoy, DO [facility] None   Follow up Visit:  [facility] [place] Follow up in 1 week(s).   Specialty: Obstetrics and Gynecology Why: For BP check Contact information: [place] [address] [phone]               Message sent to [facility] 03/27/21 by Sylvester Harder.   Please schedule this patient for a In person postpartum visit in 6 weeks with the following provider: Any provider. Additional Postpartum F/U:Postpartum Depression checkup in 1-2 weeks Low risk pregnancy complicated by:  n/a Delivery mode:  Vaginal, Vacuum (Extractor)  Anticipated Birth Control:  POPs, rx sent to pharmacy   03/29/2021 Hansel Feinstein, CNM

## 2021-03-27 NOTE — Telephone Encounter (Signed)
Preadmission screen  

## 2021-03-28 LAB — RPR: RPR Ser Ql: NONREACTIVE

## 2021-03-28 NOTE — Lactation Note (Signed)
This note was copied from a baby's chart. Lactation Consultation Note  Patient Name: Diana Fuentes [id number] Date: 03/28/2021 Reason for consult: Initial assessment;Term Age:28 hours, infant had 3 stools since birth.Per mom, infant did not latch in recovery but is now BF on MBU, mom was towards the end of BF infant on her left breast using the cradle hold position, infant was off and on breast. Per mom, she feels he is breastfeed better, she will continue to work towards latching infant at the breast. LC observed mom is short shafted( nipples), LC gave mom hand pump to pre-pump breast prior to latching infant. Mom knows to BF infant according to hunger cues, 8 to 12 or more times within 24 hours, STS. Mom knows to call RN or LC for latch assistance if needed.  LC discussed infant's input and output with parents. Mom made aware of O/P services, breastfeeding support groups, community resources, and our phone # for post-discharge questions.   Maternal Data Has patient been taught Hand Expression?: Yes Does the patient have breastfeeding experience prior to this delivery?: Yes How long did the patient breastfeed?: Per mom, she BF her 1st child for 6 months, he is currently 35 years old.  Feeding Mother's Current Feeding Choice: Breast Milk  LATCH Score Latch: Repeated attempts needed to sustain latch, nipple held in mouth throughout feeding, stimulation needed to elicit sucking reflex.  Audible Swallowing: A few with stimulation  Type of Nipple: Everted at rest and after stimulation  Comfort (Breast/Nipple): Soft / non-tender  Hold (Positioning): Assistance needed to correctly position infant at breast and maintain latch. (re-postion with pillow support, LC obseved latch at the end of the feeding.)  LATCH Score: 7   Lactation Tools Discussed/Used Tools: Pump Breast pump type: Manual Pump Education: Setup, frequency, and cleaning;Milk Storage Reason for Pumping: mom is short  shafted, help extend nipple shaft out more. Pumping frequency: pre- pump breast prior to latching infant.  Interventions Interventions: Breast feeding basics reviewed;Assisted with latch;Skin to skin;Breast compression;Adjust position;Support pillows;Position options;Hand pump;Education  Discharge Pump: Manual;Personal WIC Program: No  Consult Status Consult Status: Follow-up Date: 03/28/21 Follow-up type: In-patient    Vicente Serene 03/28/2021, 1:13 AM

## 2021-03-28 NOTE — Progress Notes (Signed)
Post Partum Day 1 Subjective: Patient is doing well without complaints. Ambulating without difficulty. Voiding and passing flatus. Tolerating PO. Abdominal pain improved. Vaginal bleeding decreased.  Objective: Blood pressure 130/71, pulse 84, temperature 98.2 F (36.8 C), temperature source Oral, resp. rate 18, last menstrual period 06/14/2020, SpO2 98 %.  Physical Exam:  General: alert, cooperative, and no distress Lochia: appropriate Uterine Fundus: firm Incision: n/a DVT Evaluation: No evidence of DVT seen on physical exam.  Recent Labs    03/27/21 1300  HGB 11.7*  HCT 36.5    Assessment/Plan: PPD#1 VD  -doing well, meeting pp milestones  -VSS except elevated BP  -desires circ for baby, consented  -POPS  #Elevated BP without diagnosis  -BP elevated before and after delivery, will recheck and likely start meds  Plan for discharge tomorrow given time of delivery.   LOS: 1 day   Arrie Senate 2/89/7915, 8:03 AM

## 2021-03-28 NOTE — Social Work (Addendum)
[name] was referred for history of anxiety/depression.  * Referral screened out by Clinical Social Worker because none of the following criteria appear to apply: ~ History of anxiety/depression during this pregnancy, or of post-partum depression following prior delivery. ~ Diagnosis of anxiety and/or depression within last 3 years. Per chart review, [name] diagnosed in 2015 and has been well controlled.  OR * [name]'s symptoms currently being treated with medication and/or therapy.  Please contact the Clinical Social Worker if needs arise, by [facility] request, or if [name] scores greater than 9/yes to question 10 on [place] Postpartum Depression Screen.   Kathrin Greathouse, MSW, LCSW [facility] [place] Worker  [phone] 03/28/2021  8:17 AM

## 2021-03-29 ENCOUNTER — Inpatient Hospital Stay (HOSPITAL_COMMUNITY): Payer: 59

## 2021-03-29 ENCOUNTER — Encounter (HOSPITAL_COMMUNITY): Payer: Self-pay | Admitting: Family Medicine

## 2021-03-29 DIAGNOSIS — O139 Gestational [pregnancy-induced] hypertension without significant proteinuria, unspecified trimester: Secondary | ICD-10-CM

## 2021-03-29 MED ORDER — NORETHINDRONE 0.35 MG PO TABS: 1.0000 | ORAL_TABLET | Freq: Every day | ORAL | 11 refills | Status: DC

## 2021-03-29 MED ORDER — IBUPROFEN 600 MG PO TABS: 600.0000 mg | ORAL_TABLET | Freq: Four times a day (QID) | ORAL | 0 refills | Status: DC

## 2021-03-29 MED ORDER — AMLODIPINE BESYLATE 5 MG PO TABS: 5.0000 mg | ORAL_TABLET | Freq: Every day | ORAL | 0 refills | Status: DC

## 2021-03-29 NOTE — Lactation Note (Signed)
This note was copied from a baby's chart. Lactation Consultation Note  Patient Name: Diana Fuentes [id number] Date: 03/29/2021   Age:28 hours  [place] visit attempted, but Mom trying to rest. LC to return later.     Diana Fuentes [name] 03/29/2021, 9:08 AM

## 2021-04-07 ENCOUNTER — Ambulatory Visit: Payer: 59

## 2021-04-08 ENCOUNTER — Telehealth (HOSPITAL_COMMUNITY): Payer: Self-pay | Admitting: *Deleted

## 2021-04-08 NOTE — Telephone Encounter (Signed)
Left message to return nurse call.  Odis Hollingshead, RN 04/08/2021 at 2:48pm

## 2021-04-14 DIAGNOSIS — Z419 Encounter for procedure for purposes other than remedying health state, unspecified: Secondary | ICD-10-CM | POA: Diagnosis not present

## 2021-05-02 ENCOUNTER — Ambulatory Visit: Payer: 59 | Admitting: Family Medicine

## 2021-05-15 ENCOUNTER — Ambulatory Visit: Payer: 59 | Admitting: Family Medicine

## 2021-05-15 DIAGNOSIS — Z419 Encounter for procedure for purposes other than remedying health state, unspecified: Secondary | ICD-10-CM | POA: Diagnosis not present

## 2021-06-04 ENCOUNTER — Ambulatory Visit: Payer: 59 | Admitting: Family Medicine

## 2021-06-14 DIAGNOSIS — Z419 Encounter for procedure for purposes other than remedying health state, unspecified: Secondary | ICD-10-CM | POA: Diagnosis not present

## 2021-07-15 DIAGNOSIS — Z419 Encounter for procedure for purposes other than remedying health state, unspecified: Secondary | ICD-10-CM | POA: Diagnosis not present

## 2021-08-14 DIAGNOSIS — Z419 Encounter for procedure for purposes other than remedying health state, unspecified: Secondary | ICD-10-CM | POA: Diagnosis not present

## 2021-09-08 IMAGING — US US MFM OB FOLLOW-UP
1 series · 13 of 28 positions shown · non-contrast
Comparison: none

[Series 1: us mfm ob follow-up · 13 of 42 slices shown]
[im 2/42]
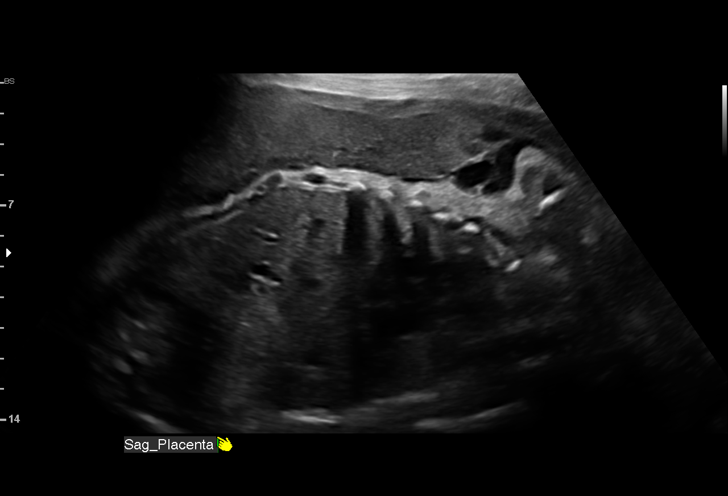
[im 5/42]
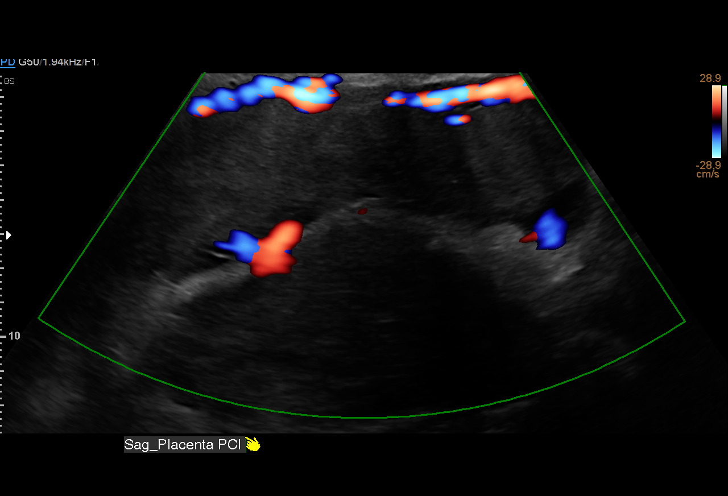
[im 8/42]
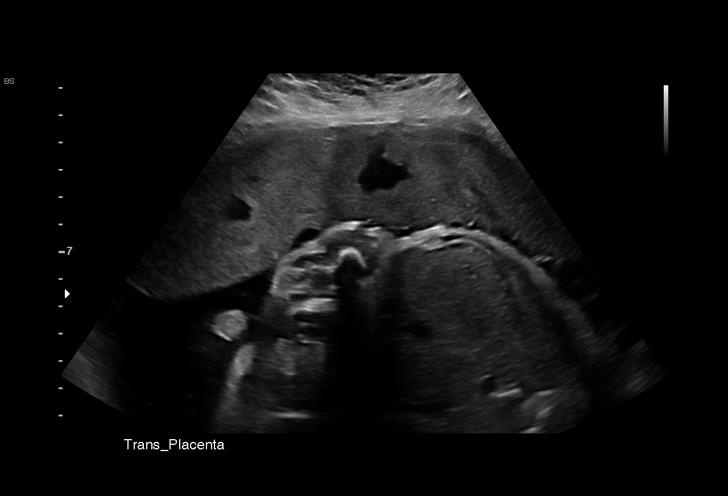
[im 11/42]
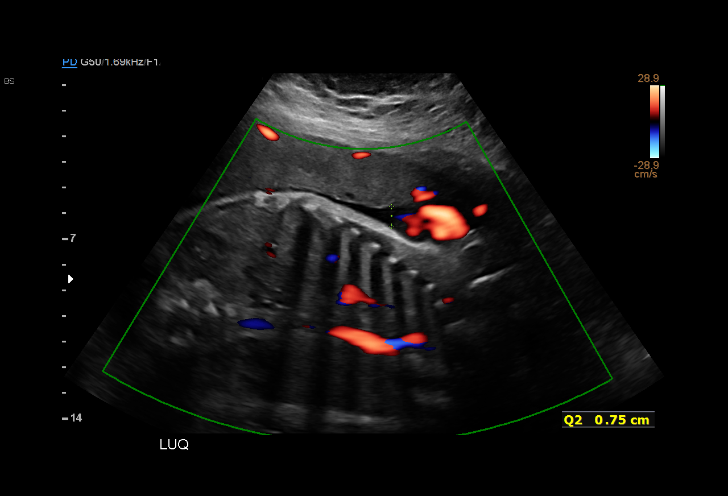
[im 14/42]
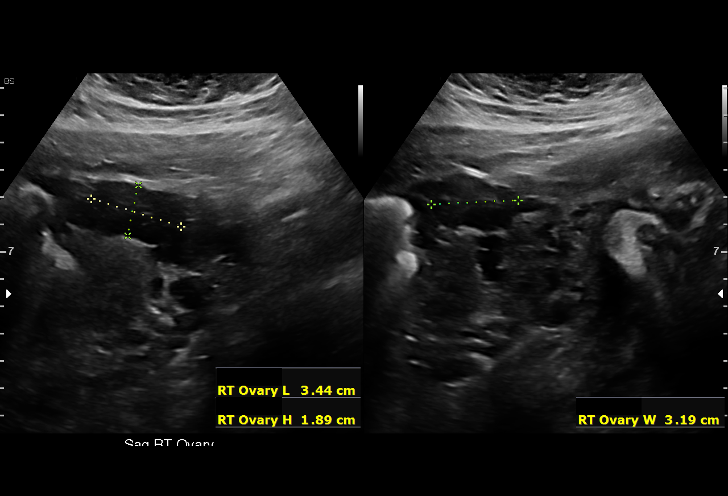
[im 17/42]
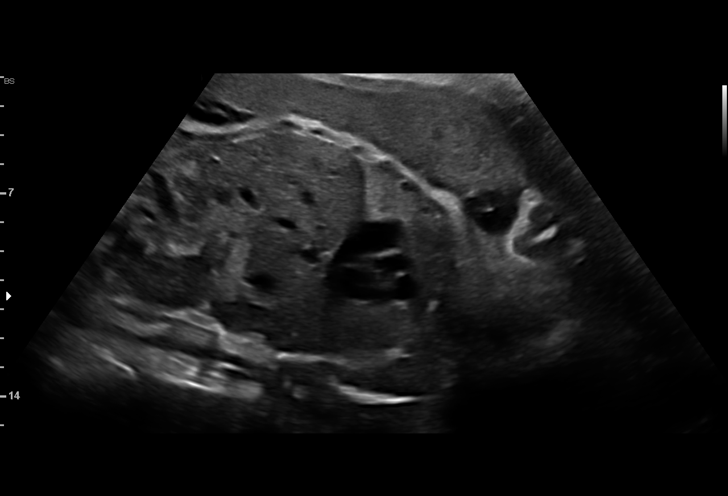
[im 22/42]
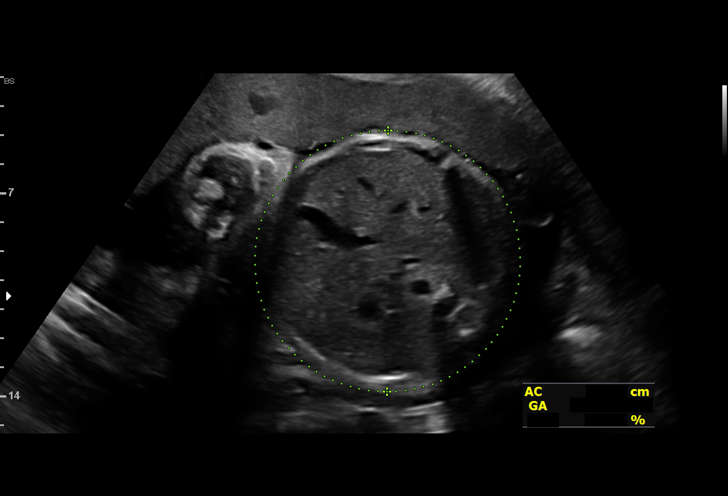
[im 25/42]
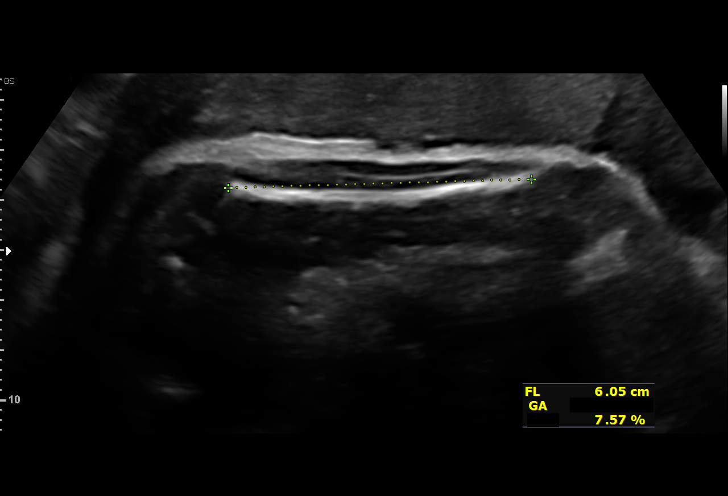
[im 28/42]
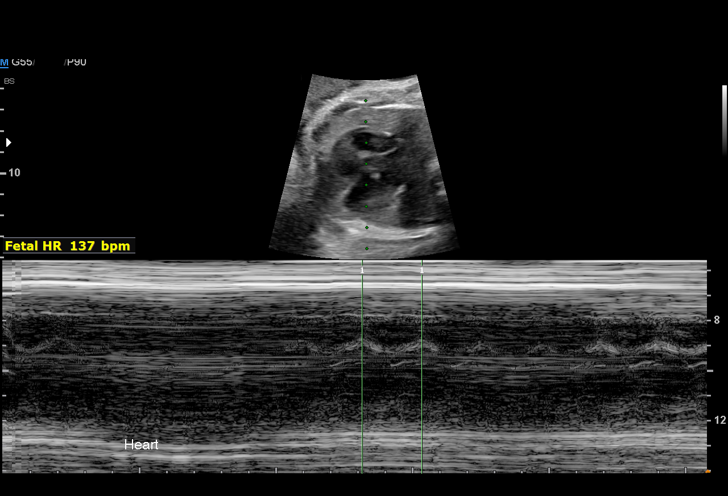
[im 31/42]
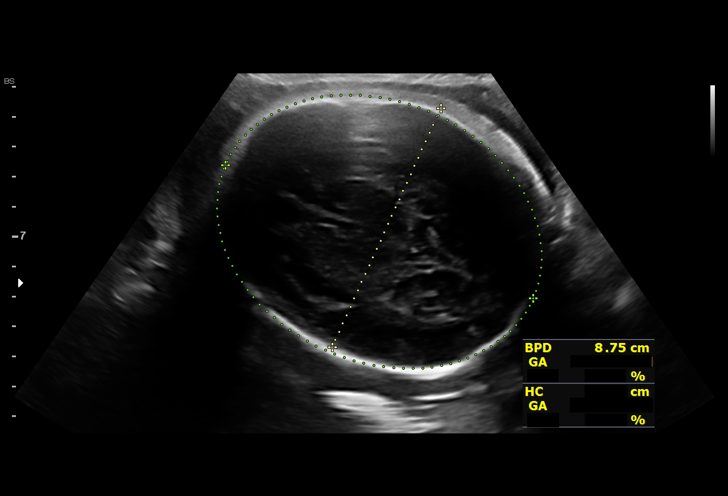
[im 34/42]
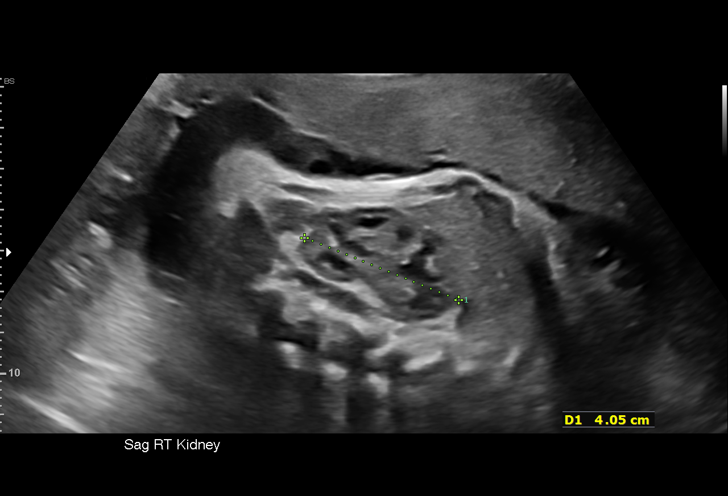
[im 37/42]
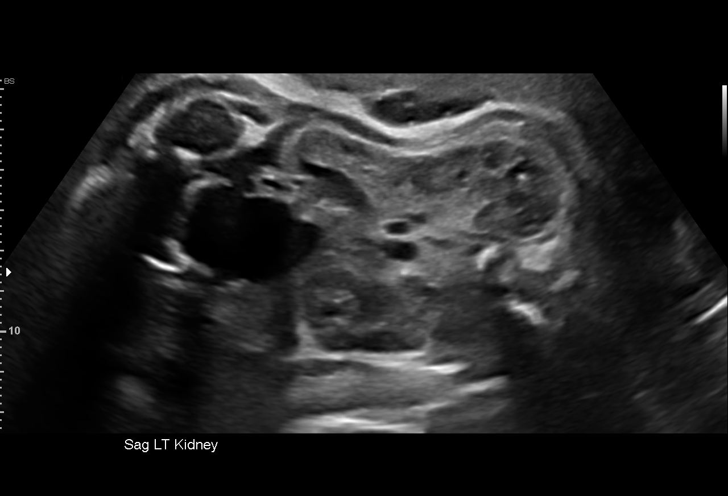
[im 40/42]
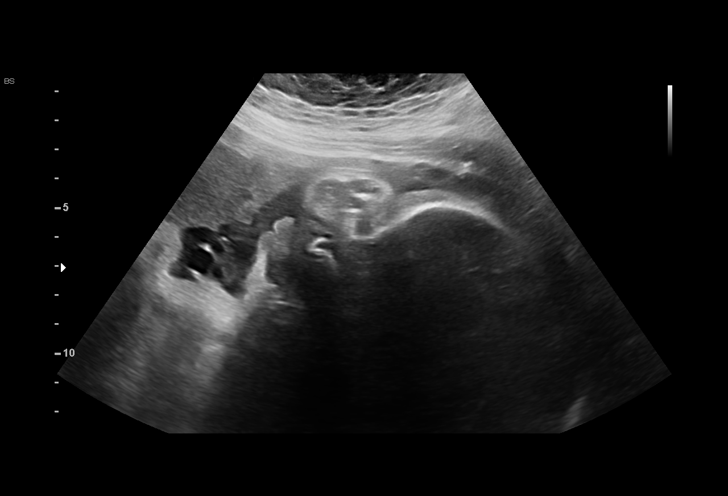

[13 of 28 positions shown; findings below may reference images not displayed]

Indications

 Fetal cardiac anomaly affecting pregnancy,
 (right-sided Ao Arch)
 Obesity complicating pregnancy, second
 trimester (pre-G BMI 31)
 Poor obstetric history: Previous
 preeclampsia / eclampsia/gestational HTN
 Maternal adrenal adenoma
 33 weeks gestation of pregnancy
Fetal Evaluation

 Num Of Fetuses:         1
 Fetal Heart Rate(bpm):  137
 Cardiac Activity:       Observed
 Presentation:           Cephalic
 Placenta:               Anterior
 P. Cord Insertion:      Visualized

 Amniotic Fluid
 AFI FV:      Within normal limits

 AFI Sum(cm)     %Tile       Largest Pocket(cm)
 14.3            50

 RUQ(cm)       RLQ(cm)       LUQ(cm)        LLQ(cm)

Biometry

 BPD:      87.6  mm     G. Age:  35w 3d         95  %    CI:        77.24   %    70 - 86
                                                         FL/HC:      19.1   %    19.9 -
 HC:      315.6  mm     G. Age:  35w 3d         76  %    HC/AC:      1.10        0.96 -
 AC:      286.7  mm     G. Age:  32w 5d         41  %    FL/BPD:     68.7   %    71 - 87
 FL:       60.2  mm     G. Age:  31w 2d          6  %    FL/AC:      21.0   %    20 - 24

 LV:        1.3  mm

 Est. FW:    5747  gm      4 lb 8 oz     34  %
OB History

 Gravidity:    2         Term:   1
 Living:       1
Gestational Age

 LMP:           33w 0d        Date:  06/14/20                 EDD:   03/21/21
 U/S Today:     33w 5d                                        EDD:   03/16/21
 Best:          33w 0d     Det. By:  LMP  (06/14/20)          EDD:   03/21/21
Anatomy

 Cranium:               Appears normal         LVOT:                   Previously seen
 Cavum:                 Previously seen        Aortic Arch:            Right sided aortic
                                                                       arch prev seen
 Ventricles:            Appears normal         Ductal Arch:            Previously seen
 Choroid Plexus:        Previously seen        Diaphragm:              Appears normal
 Cerebellum:            Previously seen        Stomach:                Appears normal, left
                                                                       sided
 Posterior Fossa:       Previously seen        Abdomen:                Appears normal
 Nuchal Fold:           Previously seen        Abdominal Wall:         Previously seen
 Face:                  Orbits and profile     Cord Vessels:           Previously seen
                        previously seen
 Lips:                  Previously seen        Kidneys:                Appear normal
 Palate:                Previously seen        Bladder:                Appears normal
 Thoracic:              Previously seen        Spine:                  Previously seen
 Heart:                 Appears normal         Upper Extremities:      Previously seen
                        (4CH, axis, and
                        situs)
 RVOT:                  Previously seen        Lower Extremities:      Previously seen

 Other:  Fetus appears to be a male.  Nasal bone prev visualized. Heels/feet
         and open hands/5th digits prev visualized. VC, 3VV and 3VTV
         (Abnormal) prev visualized.
Cervix Uterus Adnexa

 Cervix
 Not visualized (advanced GA >25wks)

 Uterus
 No abnormality visualized.
 Right Ovary
 Not visualized.

 Left Ovary
 Not visualized.

 Cul De Sac
 No free fluid seen.

 Adnexa
 No adnexal mass visualized.
Comments

 Follow up growth due to right sided aortic arch.
 Normal interval growth with measurements consistent with
 dates
 Good fetal movement and amniotic fluid volume

 Ms. Patriyono has been seen by pediatric cardiology and is
 planning to deliver in here in [HOSPITAL].

 At the conclusion of today's examination I recommended
 follow up growth as clinically indicated.

## 2021-09-14 NOTE — L&D Delivery Note (Signed)
OB/[facility]N Faculty Practice Delivery Note  Diana Fuentes is a 31 y.o. G3P2002 s/p SVD at [redacted]w[redacted]d She was admitted for IOL for NRNST.   ROM: 5h 329mith particulate meconium fluid GBS Status:  Negative/-- (09/14 1500) Maximum Maternal Temperature:  Temp (24hrs), Avg:98.4 F (36.9 C), Min:98.1 F (36.7 C), Max:98.8 F (37.1 C)    Labor Progress: Patient arrived at 2.5 cm dilation and was induced with AROM.   Delivery Date/Time: 06/22/2022 at 2122 Delivery: Called to room and patient was 9.5 cm and wanting to push. Anterior cervical lip was reducible. Head delivered in ROA position. No nuchal cord present. Shoulder and body delivered in usual fashion. Infant with spontaneous cry, placed on mother's abdomen, dried and stimulated. Cord clamped x 2 after 2-minute delay, and cut by FOB. Cord blood drawn. Placenta delivered spontaneously with gentle cord traction. Fundus firm with massage and Pitocin. Labia, perineum, vagina, and cervix inspected with no lacerations.   Placenta: Spontaneous, intact, 3 vessel cord  Complications: None Lacerations: None  EBL: 79 mL Analgesia: None    Infant: APGAR (1 MIN): 8   APGAR (5 MINS): 9   APGAR (10 MINS):    Weight: Pending   ViGifford ShaveMD  06/22/2022 10:04 PM

## 2021-12-13 DIAGNOSIS — Z419 Encounter for procedure for purposes other than remedying health state, unspecified: Secondary | ICD-10-CM | POA: Diagnosis not present

## 2021-12-16 ENCOUNTER — Encounter: Payer: Self-pay | Admitting: Advanced Practice Midwife

## 2021-12-16 ENCOUNTER — Encounter: Payer: Self-pay | Admitting: General Practice

## 2021-12-16 ENCOUNTER — Ambulatory Visit (INDEPENDENT_AMBULATORY_CARE_PROVIDER_SITE_OTHER): Payer: Medicaid Other | Admitting: Advanced Practice Midwife

## 2021-12-16 ENCOUNTER — Other Ambulatory Visit (HOSPITAL_COMMUNITY)
Admission: RE | Admit: 2021-12-16 | Discharge: 2021-12-16 | Disposition: A | Discharge status: Home / Self Care | Payer: Medicaid Other | Source: Ambulatory Visit | Attending: Advanced Practice Midwife | Admitting: Advanced Practice Midwife

## 2021-12-16 VITALS — BP 104/64 | HR 67 | Wt 207.0 lb

## 2021-12-16 DIAGNOSIS — Z3480 Encounter for supervision of other normal pregnancy, unspecified trimester: Secondary | ICD-10-CM | POA: Diagnosis not present

## 2021-12-16 DIAGNOSIS — Z8759 Personal history of other complications of pregnancy, childbirth and the puerperium: Secondary | ICD-10-CM | POA: Diagnosis not present

## 2021-12-16 MED ORDER — ASPIRIN 81 MG PO CHEW
81.0000 mg | CHEWABLE_TABLET | Freq: Every day | ORAL | 12 refills | Status: DC
Start: 2021-12-16 — End: 2022-06-24

## 2021-12-16 NOTE — Progress Notes (Signed)
?INITIAL OBSTETRICAL VISIT ?Patient name: Diana Fuentes MRN [id number]  Date of birth: 29-Oct-1990 ?Chief Complaint:   ?Initial Prenatal Visit ? ?History of Present Illness:   ?Diana Fuentes is a 31 y.o. G63P1001 Hispanic female at 51w1dby [place] 137[place] with an Estimated Date of Delivery: 06/22/22 being seen today for her initial obstetrical visit.   ?Her obstetrical history is significant for obesity.   ?Today she reports no complaints.  ? ?  12/16/2021  ?  9:43 AM 03/06/2021  ?  9:13 AM 09/14/2019  ? 11:30 AM 07/26/2018  ?  9:57 AM  ?Depression screen PHQ 2/9  ?Decreased Interest 0 0 1 0  ?Down, Depressed, Hopeless 0 0 0 0  ?PHQ - 2 Score 0 0 1 0  ?Altered sleeping '1 2 2 1  '$ ?Tired, decreased energy '1 1 1 '$ 0  ?Change in appetite 0 '1 1 1  '$ ?Feeling bad or failure about yourself  0 0 0 0  ?Trouble concentrating 0 0 2 0  ?Moving slowly or fidgety/restless 0 0 0 0  ?Suicidal thoughts 0 0 0 0  ?PHQ-9 Score '2 4 7 2  '$ ? ? ?Patient's last menstrual period was 09/15/2021. ?Last pap 2021. Results were: normal ?Review of Systems:   ?Pertinent items are noted in HPI ?Denies cramping/contractions, leakage of fluid, vaginal bleeding, abnormal vaginal discharge w/ itching/odor/irritation, headaches, visual changes, shortness of breath, chest pain, abdominal pain, severe nausea/vomiting, or problems with urination or bowel movements unless otherwise stated above.  ?Pertinent History Reviewed:  ?Reviewed past medical,surgical, social, obstetrical and family history.  ?Reviewed problem list, medications and allergies. ?OB History  ?Gravida Para Term Preterm AB Living  ?'3 1 1     1  '$ ?SAB IAB Ectopic Multiple Live Births  ?        1  ?  ?# Outcome Date GA Lbr Len/2nd Weight Sex Delivery Anes PTL Lv  ?3 Current           ?2 Term 05/22/10 3108w0d M Vag-Spont   LIV  ?   Complications: IUGR (intrauterine growth restriction) affecting care of mother, Preeclampsia  ?1 Gravida           ? ?Physical Assessment:  ? ?Vitals:  ? 12/16/21 0920  ?BP:  104/64  ?Pulse: 67  ?Weight: 207 lb (93.9 kg)  ?Body mass index is 34.45 kg/m?. ? ?     Physical Examination: ? General appearance - well appearing, and in no distress ? Mental status - alert, oriented to person, place, and time ? Psych:  She has a normal mood and affect ? Skin - warm and dry, normal color, no suspicious lesions noted ? Chest - effort normal, all lung fields clear to auscultation bilaterally ? Heart - normal rate and regular rhythm ? Abdomen - soft, nontender ? Extremities:  No swelling or varicosities noted ? Pelvic - VULVA: normal appearing vulva with no masses, tenderness or lesions  VAGINA: normal appearing vagina with normal color and discharge, no lesions  CERVIX: normal appearing cervix without discharge or lesions, no CMT ? Thin prep pap is not done  ? ?  ?Assessment & Plan:  ?1) Low-Risk Pregnancy G3P1001 at 1310w1dth an Estimated Date of Delivery: 06/22/22  ? ?2) Initial OB visit ? ?3) History or Gestational HTN ?    Advised to take Baby ASA daily due to risk factors ? ?Meds: No orders of the defined types were placed in this encounter. ? ? ?Initial labs obtained ?  Continue prenatal vitamins ?Reviewed n/v relief measures and warning s/s to report ?Reviewed recommended weight gain based on pre-gravid BMI ?Encouraged well-balanced diet ?Genetic & carrier screening discussed: declines Panorama, declines NT/IT ?Ultrasound discussed; fetal survey: requested ?CCNC completed> form faxed if has or is planning to apply for medicaid ?The nature of [place] for [facility] with multiple MDs and other Advanced Practice Providers was explained to patient; also emphasized that fellows, residents, and students are part of our team. ? ?Indications for ASA therapy (per uptodate) ?One of the following: ?H/O preeclampsia, especially early onset/adverse outcome No ?Multifetal gestation No ?CHTN No ?T1DM or T2DM No ?Chronic kidney disease No ?Autoimmune disease (antiphospholipid syndrome,  systemic lupus erythematosus) No ? ?OR Two or more of the following: ?Nulliparity No ?Obesity (BMI>30 kg/m2) Yes ?Family h/o preeclampsia in mother or sister No ?Age ?35 years No ?Sociodemographic characteristics (African American race, low socioeconomic level) Yes ?Personal risk factors (eg, previous pregnancy w/ LBW or SGA, previous adverse pregnancy outcome [eg, stillbirth], interval >10 years between pregnancies) No ? ?Indications for early A1C (per uptodate) ?BMI >=25 (>=23 in Asian women) AND one of the following ?GDM in a previous pregnancy No ?Previous A1C?5.7, impaired glucose tolerance, or impaired fasting glucose on previous testing No ?First-degree relative with diabetes No ?High-risk race/ethnicity (eg, African American, Latino, Native American, [place] American, [place]) Yes ?History of cardiovascular disease No ?HTN or on therapy for hypertension No ?HDL cholesterol level <35 mg/dL (0.90 mmol/L) and/or a triglyceride level >250 mg/dL (2.82 mmol/L) No ?PCOS No ?Physical inactivity No ?Other clinical condition associated with insulin resistance (eg, severe obesity, acanthosis nigricans) No ?Previous birth of an infant weighing ?4000 g No ?Previous stillbirth of unknown cause No ?>= 40yo No ? ?Follow-up: Return in about 4 weeks (around 01/13/2022) for [facility].  ? ?Orders Placed This Encounter  ?Procedures  ? Culture, OB Urine  ? [place] MFM OB DETAIL +14 WK  ? CBC/D/Plt+RPR+Rh+ABO+RubIgG...  ? Hemoglobin A1c  ? Comprehensive metabolic panel  ? ? ?Hansel Feinstein CNM, WHNP-BC ?12/16/2021 ?11:51 AM ? ?

## 2021-12-17 LAB — GC/CHLAMYDIA PROBE AMP (~~LOC~~) NOT AT ARMC
Chlamydia: NEGATIVE
Comment: NEGATIVE
Comment: NORMAL
Neisseria Gonorrhea: NEGATIVE

## 2021-12-17 LAB — CBC/D/PLT+RPR+RH+ABO+RUBIGG...
Antibody Screen: NEGATIVE
Basophils Absolute: 0 10*3/uL (ref 0.0–0.2)
Basos: 0 %
EOS (ABSOLUTE): 0.1 10*3/uL (ref 0.0–0.4)
Eos: 1 %
HCV Ab: NONREACTIVE
HIV Screen 4th Generation wRfx: NONREACTIVE
Hematocrit: 36.5 % (ref 34.0–46.6)
Hemoglobin: 12.1 g/dL (ref 11.1–15.9)
Hepatitis B Surface Ag: NEGATIVE
Immature Grans (Abs): 0 10*3/uL (ref 0.0–0.1)
Immature Granulocytes: 0 %
Lymphocytes Absolute: 2 10*3/uL (ref 0.7–3.1)
Lymphs: 23 %
MCH: 29.9 pg (ref 26.6–33.0)
MCHC: 33.2 g/dL (ref 31.5–35.7)
MCV: 90 fL (ref 79–97)
Monocytes Absolute: 0.4 10*3/uL (ref 0.1–0.9)
Monocytes: 5 %
Neutrophils Absolute: 5.9 10*3/uL (ref 1.4–7.0)
Neutrophils: 71 %
Platelets: 338 10*3/uL (ref 150–450)
RBC: 4.05 x10E6/uL (ref 3.77–5.28)
RDW: 12.2 % (ref 11.7–15.4)
RPR Ser Ql: NONREACTIVE
Rh Factor: POSITIVE
Rubella Antibodies, IGG: <0.9 index — ABNORMAL LOW (ref >0.99)
WBC: 8.5 10*3/uL (ref 3.4–10.8)

## 2021-12-17 LAB — HEMOGLOBIN A1C
Est. average glucose Bld gHb Est-mCnc: 111 mg/dL
Hgb A1c MFr Bld: 5.5 % (ref 4.8–5.6)

## 2021-12-17 LAB — COMPREHENSIVE METABOLIC PANEL
ALT: 9 IU/L (ref 0–32)
AST: 11 IU/L (ref 0–40)
Albumin/Globulin Ratio: 1.5 (ref 1.2–2.2)
Albumin: 4.1 g/dL (ref 3.9–5.0)
Alkaline Phosphatase: 59 IU/L (ref 44–121)
BUN/Creatinine Ratio: 9 (ref 9–23)
BUN: 5 mg/dL — ABNORMAL LOW (ref 6–20)
Bilirubin Total: 0.3 mg/dL (ref 0.0–1.2)
CO2: 18 mmol/L — ABNORMAL LOW (ref 20–29)
Calcium: 9.4 mg/dL (ref 8.7–10.2)
Chloride: 104 mmol/L (ref 96–106)
Creatinine, Ser: 0.55 mg/dL — ABNORMAL LOW (ref 0.57–1.00)
Globulin, Total: 2.8 g/dL (ref 1.5–4.5)
Glucose: 84 mg/dL (ref 70–99)
Potassium: 4.5 mmol/L (ref 3.5–5.2)
Sodium: 137 mmol/L (ref 134–144)
Total Protein: 6.9 g/dL (ref 6.0–8.5)
eGFR: 128 mL/min/{1.73_m2} (ref >59)

## 2021-12-17 LAB — HCV INTERPRETATION

## 2021-12-18 ENCOUNTER — Encounter: Payer: Self-pay | Admitting: Advanced Practice Midwife

## 2021-12-18 LAB — URINE CULTURE, OB REFLEX

## 2021-12-18 LAB — CULTURE, OB URINE

## 2022-01-16 ENCOUNTER — Ambulatory Visit (INDEPENDENT_AMBULATORY_CARE_PROVIDER_SITE_OTHER): Payer: Medicaid Other | Admitting: Family Medicine

## 2022-01-16 VITALS — BP 118/64 | HR 67 | Wt 206.0 lb

## 2022-01-16 DIAGNOSIS — O09899 Supervision of other high risk pregnancies, unspecified trimester: Secondary | ICD-10-CM

## 2022-01-16 DIAGNOSIS — M542 Cervicalgia: Secondary | ICD-10-CM

## 2022-01-16 DIAGNOSIS — M9900 Segmental and somatic dysfunction of head region: Secondary | ICD-10-CM

## 2022-01-16 DIAGNOSIS — L659 Nonscarring hair loss, unspecified: Secondary | ICD-10-CM

## 2022-01-16 DIAGNOSIS — M9902 Segmental and somatic dysfunction of thoracic region: Secondary | ICD-10-CM | POA: Diagnosis not present

## 2022-01-16 DIAGNOSIS — Z3480 Encounter for supervision of other normal pregnancy, unspecified trimester: Secondary | ICD-10-CM

## 2022-01-16 DIAGNOSIS — O09299 Supervision of pregnancy with other poor reproductive or obstetric history, unspecified trimester: Secondary | ICD-10-CM

## 2022-01-16 DIAGNOSIS — Z3A17 17 weeks gestation of pregnancy: Secondary | ICD-10-CM

## 2022-01-16 DIAGNOSIS — M9901 Segmental and somatic dysfunction of cervical region: Secondary | ICD-10-CM | POA: Diagnosis not present

## 2022-01-16 DIAGNOSIS — Z2839 Other underimmunization status: Secondary | ICD-10-CM

## 2022-01-16 MED ORDER — ONDANSETRON 4 MG PO TBDP: 4.0000 mg | ORAL_TABLET | Freq: Four times a day (QID) | ORAL | 3 refills | Status: DC | PRN

## 2022-01-16 NOTE — Progress Notes (Signed)
? ?  PRENATAL VISIT NOTE ? ?Subjective:  ?Diana Fuentes is a 31 y.o. G3P1001 at 23w4dbeing seen today for ongoing prenatal care.  She is currently monitored for the following issues for this low-risk pregnancy and has Pelvic pain; Adrenal adenoma, left; Depression; Hx of preeclampsia, prior pregnancy, currently pregnant; Abnormal fetal ultrasound; Rubella non-immune status, antepartum; Encounter for induction of labor; Vacuum-assisted vaginal delivery; Gestational hypertension; and Supervision of other normal pregnancy, antepartum on their problem list. ? ?Patient reports  slipping on stairs and falling. She hit the upper part of her back and is having upper back pain and numbness in right fingers. Also having thinning hair .  Contractions: Not present. Vag. Bleeding: None.  Movement: Present. Denies leaking of fluid.  ? ?The following portions of the patient's history were reviewed and updated as appropriate: allergies, current medications, past family history, past medical history, past social history, past surgical history and problem list. Problem list updated. ? ?Objective:  ? ?Vitals:  ? 01/16/22 0857  ?BP: 118/64  ?Pulse: 67  ?Weight: 206 lb (93.4 kg)  ? ? ?Fetal Status: Fetal Heart Rate (bpm): 145   Movement: Present    ? ?General:  Alert, oriented and cooperative. Patient is in no acute distress.  ?Skin: Skin is warm and dry. No rash noted.   ?Cardiovascular: Normal heart rate noted  ?Respiratory: Normal respiratory effort, no problems with respiration noted  ?Abdomen: Soft, gravid, appropriate for gestational age. Pain/Pressure: Present     ?Pelvic:  Cervical exam deferred        ?MSK: Restriction, tenderness, tissue texture changes, and paraspinal spasm in the upper thoracic and cervical spine  ?Neuro: Moves all four extremities with no focal neurological deficit  ?Extremities: Normal range of motion.  Edema: None  ?Mental Status: Normal mood and affect. Normal behavior. Normal judgment and thought  content.  ? ?OSE: ?Head FSLRR  ?Cervical C6 FSRR  ?Thoracic T4 FSRR  ?Rib   ?Lumbar   ?Sacrum   ?Pelvis   ? ? ?Assessment and Plan:  ?Pregnancy: G3P1001 at 172w4d ?1. [redacted] weeks gestation of pregnancy ?- AFP, Serum, Open Spina Bifida ? ?2. Supervision of other normal pregnancy, antepartum ?FHT and FH Normal ?- AFP, Serum, Open Spina Bifida ? ?3. Hair loss ?- TSH ? ?4. Neck pain ?5. Somatic dysfunction of head region ?6. Somatic dysfunction of spine, cervical ?7. Somatic dysfunction of spine, thoracic ?OMT done after patient permission. HVLA technique utilized. 3 areas treated with improvement of tissue texture and joint mobility. Patient tolerated procedure well.  ? ? ?8. Hx of preeclampsia, prior pregnancy, currently pregnant ?ASA '81mg'$  ? ?9. Rubella non-immune status, antepartum ? ? ? ?Preterm labor symptoms and general obstetric precautions including but not limited to vaginal bleeding, contractions, leaking of fluid and fetal movement were reviewed in detail with the patient. ?Please refer to After Visit Summary for other counseling recommendations.  ?No follow-ups on file. ? ?StTruett MainlandDO ?

## 2022-01-18 LAB — AFP, SERUM, OPEN SPINA BIFIDA
AFP MoM: 0.68
AFP Value: 23.3 ng/mL
Gest. Age on Collection Date: 17.4 weeks
Maternal Age At EDD: 29.1 yr
OSBR Risk 1 IN: 10000
Test Results:: NEGATIVE
Weight: 206 [lb_av]

## 2022-01-18 LAB — TSH: TSH: 1 u[IU]/mL (ref 0.450–4.500)

## 2022-01-20 ENCOUNTER — Encounter: Payer: Self-pay | Admitting: Family Medicine

## 2022-01-27 ENCOUNTER — Other Ambulatory Visit: Payer: Self-pay | Admitting: *Deleted

## 2022-01-27 ENCOUNTER — Ambulatory Visit: Payer: Medicaid Other | Admitting: *Deleted

## 2022-01-27 ENCOUNTER — Ambulatory Visit: Payer: Medicaid Other | Attending: Advanced Practice Midwife

## 2022-01-27 ENCOUNTER — Encounter: Payer: Self-pay | Admitting: *Deleted

## 2022-01-27 VITALS — BP 129/65 | HR 72

## 2022-01-27 DIAGNOSIS — Z363 Encounter for antenatal screening for malformations: Secondary | ICD-10-CM | POA: Diagnosis not present

## 2022-01-27 DIAGNOSIS — Z3A19 19 weeks gestation of pregnancy: Secondary | ICD-10-CM | POA: Diagnosis not present

## 2022-01-27 DIAGNOSIS — O99212 Obesity complicating pregnancy, second trimester: Secondary | ICD-10-CM | POA: Insufficient documentation

## 2022-01-27 DIAGNOSIS — O358XX0 Maternal care for other (suspected) fetal abnormality and damage, not applicable or unspecified: Secondary | ICD-10-CM | POA: Diagnosis not present

## 2022-01-27 DIAGNOSIS — Z362 Encounter for other antenatal screening follow-up: Secondary | ICD-10-CM

## 2022-01-27 DIAGNOSIS — Z3480 Encounter for supervision of other normal pregnancy, unspecified trimester: Secondary | ICD-10-CM

## 2022-01-27 DIAGNOSIS — Z6835 Body mass index (BMI) 35.0-35.9, adult: Secondary | ICD-10-CM

## 2022-01-27 DIAGNOSIS — Z8759 Personal history of other complications of pregnancy, childbirth and the puerperium: Secondary | ICD-10-CM

## 2022-02-12 DIAGNOSIS — Z419 Encounter for procedure for purposes other than remedying health state, unspecified: Secondary | ICD-10-CM | POA: Diagnosis not present

## 2022-02-12 NOTE — Progress Notes (Unsigned)
   PRENATAL VISIT NOTE  Subjective:  Diana Fuentes is a 31 y.o. G3P2002 at 43w4dbeing seen today for ongoing prenatal care.  She is currently monitored for the following issues for this high-risk pregnancy and has Adrenal adenoma, left; Depression; Hx of preeclampsia, prior pregnancy, currently pregnant; Rubella non-immune status, antepartum; and Supervision of other normal pregnancy, antepartum on their problem list.  Patient reports {[id number]}.   .  .   . Denies leaking of fluid.   The following portions of the patient's history were reviewed and updated as appropriate: allergies, current medications, past family history, past medical history, past social history, past surgical history and problem list.   Objective:  There were no vitals filed for this visit.  Fetal Status:           General:  Alert, oriented and cooperative. Patient is in no acute distress.  Skin: Skin is warm and dry. No rash noted.   Cardiovascular: Normal heart rate noted  Respiratory: Normal respiratory effort, no problems with respiration noted  Abdomen: Soft, gravid, appropriate for gestational age.        Pelvic: Cervical exam deferred        Extremities: Normal range of motion.     Mental Status: Normal mood and affect. Normal behavior. Normal judgment and thought content.   Assessment and Plan:  Pregnancy: G3P2002 at 215w4d. Supervision of other normal pregnancy, antepartum - EICF on anatomy. Anatomy also incomplete. Has F/U on 6/15 - Offered NIPT again following discussion with MFM on 5/16.   2. Hx of preeclampsia, prior pregnancy, currently pregnant BP ***  3. Depression, unspecified depression type Mood ***  4. Rubella non-immune status, antepartum Reviewed MMRI after delivery ***  Preterm labor symptoms and general obstetric precautions including but not limited to vaginal bleeding, contractions, leaking of fluid and fetal movement were reviewed in detail with the patient. Please refer to  After Visit Summary for other counseling recommendations.   No follow-ups on file.  Future Appointments  Date Time Provider [place]6/10/2021  9:55 AM DuRadene GunningMD [facility] None  02/26/2022  8:30 AM [facility]URSE [facility]  8:45 AM [facility]  8:55 AM StNehemiah SettleJaTanna SavoyDO [facility] None    PaRadene GunningMD

## 2022-02-13 ENCOUNTER — Ambulatory Visit (INDEPENDENT_AMBULATORY_CARE_PROVIDER_SITE_OTHER): Payer: Medicaid Other | Admitting: Obstetrics and Gynecology

## 2022-02-13 VITALS — BP 125/73 | HR 85 | Wt 208.0 lb

## 2022-02-13 DIAGNOSIS — Z2839 Other underimmunization status: Secondary | ICD-10-CM

## 2022-02-13 DIAGNOSIS — Z3480 Encounter for supervision of other normal pregnancy, unspecified trimester: Secondary | ICD-10-CM

## 2022-02-13 DIAGNOSIS — F32A Depression, unspecified: Secondary | ICD-10-CM

## 2022-02-13 DIAGNOSIS — Z3490 Encounter for supervision of normal pregnancy, unspecified, unspecified trimester: Secondary | ICD-10-CM

## 2022-02-13 DIAGNOSIS — O09299 Supervision of pregnancy with other poor reproductive or obstetric history, unspecified trimester: Secondary | ICD-10-CM

## 2022-02-13 DIAGNOSIS — Z3A21 21 weeks gestation of pregnancy: Secondary | ICD-10-CM

## 2022-02-13 DIAGNOSIS — L659 Nonscarring hair loss, unspecified: Secondary | ICD-10-CM

## 2022-02-13 DIAGNOSIS — O09899 Supervision of other high risk pregnancies, unspecified trimester: Secondary | ICD-10-CM

## 2022-02-26 ENCOUNTER — Ambulatory Visit: Payer: Medicaid Other | Attending: Obstetrics and Gynecology | Admitting: *Deleted

## 2022-02-26 ENCOUNTER — Other Ambulatory Visit: Payer: Self-pay | Admitting: *Deleted

## 2022-02-26 ENCOUNTER — Ambulatory Visit: Payer: Medicaid Other

## 2022-02-26 ENCOUNTER — Encounter: Payer: Self-pay | Admitting: *Deleted

## 2022-02-26 ENCOUNTER — Ambulatory Visit (HOSPITAL_BASED_OUTPATIENT_CLINIC_OR_DEPARTMENT_OTHER): Payer: Medicaid Other

## 2022-02-26 VITALS — BP 135/74 | HR 83

## 2022-02-26 DIAGNOSIS — O358XX0 Maternal care for other (suspected) fetal abnormality and damage, not applicable or unspecified: Secondary | ICD-10-CM | POA: Diagnosis not present

## 2022-02-26 DIAGNOSIS — O09299 Supervision of pregnancy with other poor reproductive or obstetric history, unspecified trimester: Secondary | ICD-10-CM

## 2022-02-26 DIAGNOSIS — Z3A23 23 weeks gestation of pregnancy: Secondary | ICD-10-CM

## 2022-02-26 DIAGNOSIS — E669 Obesity, unspecified: Secondary | ICD-10-CM

## 2022-02-26 DIAGNOSIS — Z6835 Body mass index (BMI) 35.0-35.9, adult: Secondary | ICD-10-CM

## 2022-02-26 DIAGNOSIS — O99212 Obesity complicating pregnancy, second trimester: Secondary | ICD-10-CM

## 2022-02-26 DIAGNOSIS — Z362 Encounter for other antenatal screening follow-up: Secondary | ICD-10-CM | POA: Insufficient documentation

## 2022-02-26 DIAGNOSIS — O283 Abnormal ultrasonic finding on antenatal screening of mother: Secondary | ICD-10-CM

## 2022-02-26 DIAGNOSIS — Z8759 Personal history of other complications of pregnancy, childbirth and the puerperium: Secondary | ICD-10-CM

## 2022-03-20 ENCOUNTER — Encounter: Payer: Medicaid Other | Admitting: Family Medicine

## 2022-03-26 ENCOUNTER — Ambulatory Visit: Payer: Medicaid Other | Admitting: *Deleted

## 2022-03-26 ENCOUNTER — Ambulatory Visit: Payer: Medicaid Other | Attending: Obstetrics

## 2022-03-26 ENCOUNTER — Encounter: Payer: Self-pay | Admitting: *Deleted

## 2022-03-26 VITALS — BP 121/78 | HR 78

## 2022-03-26 DIAGNOSIS — Z6835 Body mass index (BMI) 35.0-35.9, adult: Secondary | ICD-10-CM | POA: Diagnosis present

## 2022-03-26 DIAGNOSIS — E669 Obesity, unspecified: Secondary | ICD-10-CM | POA: Diagnosis not present

## 2022-03-26 DIAGNOSIS — O35BXX0 Maternal care for other (suspected) fetal abnormality and damage, fetal cardiac anomalies, not applicable or unspecified: Secondary | ICD-10-CM | POA: Diagnosis not present

## 2022-03-26 DIAGNOSIS — O283 Abnormal ultrasonic finding on antenatal screening of mother: Secondary | ICD-10-CM

## 2022-03-26 DIAGNOSIS — Z8759 Personal history of other complications of pregnancy, childbirth and the puerperium: Secondary | ICD-10-CM | POA: Diagnosis present

## 2022-03-26 DIAGNOSIS — O99212 Obesity complicating pregnancy, second trimester: Secondary | ICD-10-CM

## 2022-03-26 DIAGNOSIS — O09299 Supervision of pregnancy with other poor reproductive or obstetric history, unspecified trimester: Secondary | ICD-10-CM | POA: Diagnosis present

## 2022-03-26 DIAGNOSIS — Z3A27 27 weeks gestation of pregnancy: Secondary | ICD-10-CM

## 2022-03-26 DIAGNOSIS — O09292 Supervision of pregnancy with other poor reproductive or obstetric history, second trimester: Secondary | ICD-10-CM

## 2022-04-03 ENCOUNTER — Encounter: Payer: Medicaid Other | Admitting: Obstetrics & Gynecology

## 2022-04-29 ENCOUNTER — Encounter: Payer: Medicaid Other | Admitting: Family Medicine

## 2022-04-30 ENCOUNTER — Ambulatory Visit (INDEPENDENT_AMBULATORY_CARE_PROVIDER_SITE_OTHER): Payer: Medicaid Other | Admitting: Family Medicine

## 2022-04-30 VITALS — BP 100/66 | HR 102 | Wt 221.0 lb

## 2022-04-30 DIAGNOSIS — Z2839 Other underimmunization status: Secondary | ICD-10-CM

## 2022-04-30 DIAGNOSIS — Z3483 Encounter for supervision of other normal pregnancy, third trimester: Secondary | ICD-10-CM

## 2022-04-30 DIAGNOSIS — M9908 Segmental and somatic dysfunction of rib cage: Secondary | ICD-10-CM

## 2022-04-30 DIAGNOSIS — M9901 Segmental and somatic dysfunction of cervical region: Secondary | ICD-10-CM | POA: Diagnosis not present

## 2022-04-30 DIAGNOSIS — O09893 Supervision of other high risk pregnancies, third trimester: Secondary | ICD-10-CM

## 2022-04-30 DIAGNOSIS — O09293 Supervision of pregnancy with other poor reproductive or obstetric history, third trimester: Secondary | ICD-10-CM

## 2022-04-30 DIAGNOSIS — O09299 Supervision of pregnancy with other poor reproductive or obstetric history, unspecified trimester: Secondary | ICD-10-CM

## 2022-04-30 DIAGNOSIS — M9902 Segmental and somatic dysfunction of thoracic region: Secondary | ICD-10-CM

## 2022-04-30 DIAGNOSIS — Z3A32 32 weeks gestation of pregnancy: Secondary | ICD-10-CM

## 2022-04-30 DIAGNOSIS — M542 Cervicalgia: Secondary | ICD-10-CM

## 2022-04-30 DIAGNOSIS — Z3480 Encounter for supervision of other normal pregnancy, unspecified trimester: Secondary | ICD-10-CM

## 2022-04-30 NOTE — Progress Notes (Signed)
   PRENATAL VISIT NOTE  Subjective:  Diana Fuentes is a 31 y.o. G3P2002 at [redacted]w[redacted]d being seen today for ongoing prenatal care.  She is currently monitored for the following issues for this low-risk pregnancy and has Adrenal adenoma, left; Depression; Hx of preeclampsia, prior pregnancy, currently pregnant; Rubella non-immune status, antepartum; and Supervision of other normal pregnancy, antepartum on their problem list.  Patient reports  numbness occasionally in right hand with upper thoracic and neck pain .  Contractions: Irritability. Vag. Bleeding: None.  Movement: Present. Denies leaking of fluid.   The following portions of the patient's history were reviewed and updated as appropriate: allergies, current medications, past family history, past medical history, past social history, past surgical history and problem list. Problem list updated.  Objective:   Vitals:   04/30/22 0922  BP: 100/66  Pulse: (!) 102  Weight: 221 lb (100.2 kg)    Fetal Status: Fetal Heart Rate (bpm): 152   Movement: Present     General:  Alert, oriented and cooperative. Patient is in no acute distress.  Skin: Skin is warm and dry. No rash noted.   Cardiovascular: Normal heart rate noted  Respiratory: Normal respiratory effort, no problems with respiration noted  Abdomen: Soft, gravid, appropriate for gestational age. Pain/Pressure: Present     Pelvic:  Cervical exam deferred        MSK: Restriction, tenderness, tissue texture changes, and paraspinal spasm in the right thoracic spine  Neuro: Moves all four extremities with no focal neurological deficit  Extremities: Normal range of motion.  Edema: None  Mental Status: Normal mood and affect. Normal behavior. Normal judgment and thought content.   OSE: Head   Cervical C6 FSRR  Thoracic T4-5 SFRR  Rib 4-5 right ribs inhaled.  Lumbar   Sacrum   Pelvis     Assessment and Plan:  Pregnancy: G3P2002 at [redacted]w[redacted]d  1. [redacted] weeks gestation of pregnancy  2.  Supervision of other normal pregnancy, antepartum FHT and FH normal  3. Rubella non-immune status, antepartum MMR post delivery  4. Hx of preeclampsia, prior pregnancy, currently pregnant On ASA $Remo'81mg'oRXQf$   5. Neck pain 6. Somatic dysfunction of spine, thoracic 7. Somatic dysfunction of rib 8. Somatic dysfunction of spine, cervical OMT done after patient permission. HVLA technique utilized. 3 areas treated with improvement of tissue texture and joint mobility. Patient tolerated procedure well.     Preterm labor symptoms and general obstetric precautions including but not limited to vaginal bleeding, contractions, leaking of fluid and fetal movement were reviewed in detail with the patient. Please refer to After Visit Summary for other counseling recommendations.  No follow-ups on file.  Truett Mainland, DO

## 2022-05-28 ENCOUNTER — Other Ambulatory Visit (HOSPITAL_COMMUNITY)
Admission: RE | Admit: 2022-05-28 | Discharge: 2022-05-28 | Disposition: A | Discharge status: Home / Self Care | Payer: Medicaid Other | Source: Ambulatory Visit | Attending: Obstetrics & Gynecology | Admitting: Obstetrics & Gynecology

## 2022-05-28 ENCOUNTER — Ambulatory Visit (INDEPENDENT_AMBULATORY_CARE_PROVIDER_SITE_OTHER): Payer: Medicaid Other | Admitting: Obstetrics & Gynecology

## 2022-05-28 ENCOUNTER — Encounter: Payer: Self-pay | Admitting: Obstetrics & Gynecology

## 2022-05-28 VITALS — BP 127/72 | HR 82 | Wt 230.0 lb

## 2022-05-28 DIAGNOSIS — Z3A36 36 weeks gestation of pregnancy: Secondary | ICD-10-CM | POA: Insufficient documentation

## 2022-05-28 DIAGNOSIS — Z3480 Encounter for supervision of other normal pregnancy, unspecified trimester: Secondary | ICD-10-CM | POA: Insufficient documentation

## 2022-05-28 DIAGNOSIS — Z3483 Encounter for supervision of other normal pregnancy, third trimester: Secondary | ICD-10-CM

## 2022-05-28 DIAGNOSIS — O09299 Supervision of pregnancy with other poor reproductive or obstetric history, unspecified trimester: Secondary | ICD-10-CM

## 2022-05-28 NOTE — Progress Notes (Signed)
   PRENATAL VISIT NOTE  Subjective:  Diana Fuentes is a 31 y.o. G3P2002 at 29w3dbeing seen today for ongoing prenatal care.  She is currently monitored for the following issues for this low-risk pregnancy and has Adrenal adenoma, left; Depression; Hx of preeclampsia, prior pregnancy, currently pregnant; Rubella non-immune status, antepartum; and Supervision of other normal pregnancy, antepartum on their problem list.  Patient reports occasional contractions.  Contractions: Irritability. Vag. Bleeding: None.  Movement: Present. Denies leaking of fluid.   The following portions of the patient's history were reviewed and updated as appropriate: allergies, current medications, past family history, past medical history, past social history, past surgical history and problem list.   Objective:   Vitals:   05/28/22 1452  BP: 127/72  Pulse: 82  Weight: 230 lb (104.3 kg)    Fetal Status: Fetal Heart Rate (bpm): 145   Movement: Present  Presentation: Vertex  General:  Alert, oriented and cooperative. Patient is in no acute distress.  Skin: Skin is warm and dry. No rash noted.   Cardiovascular: Normal heart rate noted  Respiratory: Normal respiratory effort, no problems with respiration noted  Abdomen: Soft, gravid, appropriate for gestational age.  Pain/Pressure: Present     Pelvic: Cervical exam deferred Dilation: 1 Effacement (%): Thick Station: Ballotable  Extremities: Normal range of motion.  Edema: None  Mental Status: Normal mood and affect. Normal behavior. Normal judgment and thought content.   Assessment and Plan:  Pregnancy: G3P2002 at 359w3d. Hx of preeclampsia, prior pregnancy, currently pregnant Stable BP, continue ASA.  2. [redacted] weeks gestation of pregnancy 3. Supervision of other normal pregnancy, antepartum Pelvic cultures done today, will follow up results and manage accordingly. - Culture, beta strep (group b only) - GC/Chlamydia probe amp ([place])not at  [facility] symptoms and general obstetric precautions including but not limited to vaginal bleeding, contractions, leaking of fluid and fetal movement were reviewed in detail with the patient. Please refer to After Visit Summary for other counseling recommendations.   Return in about 1 week (around 06/04/2022) for OFFICE OB VISIT (MD or APP).  Future Appointments  Date Time Provider [place]9/28/2023  9:35 AM StTruett MainlandDO [facility] None  06/19/2022 10:35 AM StNehemiah SettleJaTanna SavoyDO [facility] None    UgVerita SchneidersMD

## 2022-05-28 NOTE — Patient Instructions (Signed)
Return to office for any scheduled appointments. Call the office or go to the MAU at [facility] if: You begin to have strong, frequent contractions Your water breaks.  Sometimes it is a big gush of fluid, sometimes it is just a trickle that keeps getting your underwear wet or running down your legs You have vaginal bleeding.  It is normal to have a small amount of spotting if your cervix was checked.  You do not feel your baby moving like normal.  If you do not, get something to eat and drink and lay down and focus on feeling your baby move.   If your baby is still not moving like normal, you should call the office or go to MAU. Any other obstetric concerns.  

## 2022-06-01 LAB — GC/CHLAMYDIA PROBE AMP (~~LOC~~) NOT AT ARMC
Chlamydia: NEGATIVE
Comment: NEGATIVE
Comment: NORMAL
Neisseria Gonorrhea: NEGATIVE

## 2022-06-01 LAB — CULTURE, BETA STREP (GROUP B ONLY): Strep Gp B Culture: NEGATIVE

## 2022-06-05 ENCOUNTER — Encounter: Payer: Medicaid Other | Admitting: Family Medicine

## 2022-06-11 ENCOUNTER — Ambulatory Visit (INDEPENDENT_AMBULATORY_CARE_PROVIDER_SITE_OTHER): Payer: Medicaid Other | Admitting: Family Medicine

## 2022-06-11 VITALS — BP 136/86 | HR 72 | Wt 229.0 lb

## 2022-06-11 DIAGNOSIS — O09899 Supervision of other high risk pregnancies, unspecified trimester: Secondary | ICD-10-CM

## 2022-06-11 DIAGNOSIS — O09893 Supervision of other high risk pregnancies, third trimester: Secondary | ICD-10-CM

## 2022-06-11 DIAGNOSIS — Z2839 Other underimmunization status: Secondary | ICD-10-CM

## 2022-06-11 DIAGNOSIS — O09299 Supervision of pregnancy with other poor reproductive or obstetric history, unspecified trimester: Secondary | ICD-10-CM

## 2022-06-11 DIAGNOSIS — O09293 Supervision of pregnancy with other poor reproductive or obstetric history, third trimester: Secondary | ICD-10-CM

## 2022-06-11 DIAGNOSIS — Z3480 Encounter for supervision of other normal pregnancy, unspecified trimester: Secondary | ICD-10-CM

## 2022-06-11 DIAGNOSIS — Z3A38 38 weeks gestation of pregnancy: Secondary | ICD-10-CM

## 2022-06-11 LAB — POCT URINALYSIS DIPSTICK OB

## 2022-06-11 NOTE — Progress Notes (Signed)
   PRENATAL VISIT NOTE  Subjective:  Diana Fuentes is a 31 y.o. G3P2002 at 55w3dbeing seen today for ongoing prenatal care.  She is currently monitored for the following issues for this low-risk pregnancy and has Adrenal adenoma, left; Depression; Hx of preeclampsia, prior pregnancy, currently pregnant; Rubella non-immune status, antepartum; and Supervision of other normal pregnancy, antepartum on their problem list.  Patient reports occasional contractions.  Contractions: Irritability. Vag. Bleeding: None.  Movement: Present. Denies leaking of fluid.   The following portions of the patient's history were reviewed and updated as appropriate: allergies, current medications, past family history, past medical history, past social history, past surgical history and problem list.   Objective:   Vitals:   06/11/22 0928  BP: 136/86  Pulse: 72  Weight: 229 lb (103.9 kg)    Fetal Status: Fetal Heart Rate (bpm): 145 Fundal Height: 38 cm Movement: Present  Presentation: Vertex  General:  Alert, oriented and cooperative. Patient is in no acute distress.  Skin: Skin is warm and dry. No rash noted.   Cardiovascular: Normal heart rate noted  Respiratory: Normal respiratory effort, no problems with respiration noted  Abdomen: Soft, gravid, appropriate for gestational age.  Pain/Pressure: Present     Pelvic: Cervical exam performed in the presence of a chaperone Dilation: 3 Effacement (%): Thick Station: Ballotable  Extremities: Normal range of motion.  Edema: Mild pitting, slight indentation  Mental Status: Normal mood and affect. Normal behavior. Normal judgment and thought content.   Assessment and Plan:  Pregnancy: G3P2002 at 377w3d. Supervision of other normal pregnancy, antepartum FHT and FH normal - POC Urinalysis Dipstick OB  2. Rubella non-immune status, antepartum  3. Hx of preeclampsia, prior pregnancy, currently pregnant - POC Urinalysis Dipstick OB  Term labor symptoms and  general obstetric precautions including but not limited to vaginal bleeding, contractions, leaking of fluid and fetal movement were reviewed in detail with the patient. Please refer to After Visit Summary for other counseling recommendations.   No follow-ups on file.  Future Appointments  Date Time Provider De[place]10/02/2022 10:35 AM StTruett MainlandDO [facility] None    JaTruett MainlandDO

## 2022-06-19 ENCOUNTER — Ambulatory Visit (INDEPENDENT_AMBULATORY_CARE_PROVIDER_SITE_OTHER): Payer: Medicaid Other | Admitting: Family Medicine

## 2022-06-19 VITALS — BP 133/83 | HR 68 | Wt 231.0 lb

## 2022-06-19 DIAGNOSIS — O09293 Supervision of pregnancy with other poor reproductive or obstetric history, third trimester: Secondary | ICD-10-CM

## 2022-06-19 DIAGNOSIS — Z3A39 39 weeks gestation of pregnancy: Secondary | ICD-10-CM

## 2022-06-19 DIAGNOSIS — O09299 Supervision of pregnancy with other poor reproductive or obstetric history, unspecified trimester: Secondary | ICD-10-CM

## 2022-06-19 DIAGNOSIS — Z2839 Other underimmunization status: Secondary | ICD-10-CM

## 2022-06-19 DIAGNOSIS — O09899 Supervision of other high risk pregnancies, unspecified trimester: Secondary | ICD-10-CM

## 2022-06-19 DIAGNOSIS — O09893 Supervision of other high risk pregnancies, third trimester: Secondary | ICD-10-CM

## 2022-06-19 DIAGNOSIS — Z3480 Encounter for supervision of other normal pregnancy, unspecified trimester: Secondary | ICD-10-CM

## 2022-06-19 NOTE — Progress Notes (Signed)
   PRENATAL VISIT NOTE  Subjective:  Diana Fuentes is a 31 y.o. G3P2002 at 72w4dbeing seen today for ongoing prenatal care.  She is currently monitored for the following issues for this low-risk pregnancy and has Adrenal adenoma, left; Depression; Hx of preeclampsia, prior pregnancy, currently pregnant; Rubella non-immune status, antepartum; and Supervision of other normal pregnancy, antepartum on their problem list.  Patient reports occasional contractions.  Contractions: Irritability. Vag. Bleeding: None.  Movement: Present. Denies leaking of fluid.   The following portions of the patient's history were reviewed and updated as appropriate: allergies, current medications, past family history, past medical history, past social history, past surgical history and problem list.   Objective:   Vitals:   06/19/22 1035  BP: 133/83  Pulse: 68  Weight: 231 lb (104.8 kg)    Fetal Status: Fetal Heart Rate (bpm): 134   Movement: Present     General:  Alert, oriented and cooperative. Patient is in no acute distress.  Skin: Skin is warm and dry. No rash noted.   Cardiovascular: Normal heart rate noted  Respiratory: Normal respiratory effort, no problems with respiration noted  Abdomen: Soft, gravid, appropriate for gestational age.  Pain/Pressure: Present     Pelvic: Cervical exam performed in the presence of a chaperone        Extremities: Normal range of motion.  Edema: Trace  Mental Status: Normal mood and affect. Normal behavior. Normal judgment and thought content.   Assessment and Plan:  Pregnancy: G3P2002 at 362w4d. [redacted] weeks gestation of pregnancy FHT and FH normal  2. Supervision of other normal pregnancy, antepartum FHT normal Induction scheduled at 41 weeks BPP next week.  3. Rubella non-immune status, antepartum  4. Hx of preeclampsia, prior pregnancy, currently pregnant BP normal ASA '81mg'$   Preterm labor symptoms and general obstetric precautions including but not  limited to vaginal bleeding, contractions, leaking of fluid and fetal movement were reviewed in detail with the patient. Please refer to After Visit Summary for other counseling recommendations.   No follow-ups on file.  No future appointments.  JaTruett MainlandDO

## 2022-06-22 ENCOUNTER — Telehealth (HOSPITAL_COMMUNITY): Payer: Self-pay | Admitting: *Deleted

## 2022-06-22 ENCOUNTER — Other Ambulatory Visit: Payer: Self-pay

## 2022-06-22 ENCOUNTER — Encounter (HOSPITAL_COMMUNITY): Payer: Self-pay | Admitting: Family Medicine

## 2022-06-22 ENCOUNTER — Inpatient Hospital Stay (HOSPITAL_COMMUNITY)
Admission: AD | Admit: 2022-06-22 | Discharge: 2022-06-24 | DRG: 807 | Disposition: A | Discharge status: Home / Self Care | Payer: Medicaid Other | Attending: Family Medicine | Admitting: Family Medicine

## 2022-06-22 ENCOUNTER — Encounter (HOSPITAL_COMMUNITY): Payer: Self-pay

## 2022-06-22 DIAGNOSIS — O164 Unspecified maternal hypertension, complicating childbirth: Secondary | ICD-10-CM | POA: Diagnosis present

## 2022-06-22 DIAGNOSIS — Z87891 Personal history of nicotine dependence: Secondary | ICD-10-CM | POA: Diagnosis not present

## 2022-06-22 DIAGNOSIS — Z3A40 40 weeks gestation of pregnancy: Secondary | ICD-10-CM

## 2022-06-22 DIAGNOSIS — O09899 Supervision of other high risk pregnancies, unspecified trimester: Secondary | ICD-10-CM

## 2022-06-22 DIAGNOSIS — Z3490 Encounter for supervision of normal pregnancy, unspecified, unspecified trimester: Secondary | ICD-10-CM | POA: Diagnosis present

## 2022-06-22 DIAGNOSIS — O99214 Obesity complicating childbirth: Secondary | ICD-10-CM | POA: Diagnosis present

## 2022-06-22 DIAGNOSIS — O09299 Supervision of pregnancy with other poor reproductive or obstetric history, unspecified trimester: Secondary | ICD-10-CM

## 2022-06-22 DIAGNOSIS — Z2839 Other underimmunization status: Secondary | ICD-10-CM

## 2022-06-22 DIAGNOSIS — Z3480 Encounter for supervision of other normal pregnancy, unspecified trimester: Secondary | ICD-10-CM

## 2022-06-22 LAB — CBC
HCT: 33.8 % — ABNORMAL LOW (ref 36.0–46.0)
Hemoglobin: 11.2 g/dL — ABNORMAL LOW (ref 12.0–15.0)
MCH: 29.6 pg (ref 26.0–34.0)
MCHC: 33.1 g/dL (ref 30.0–36.0)
MCV: 89.2 fL (ref 80.0–100.0)
Platelets: 271 10*3/uL (ref 150–400)
RBC: 3.79 MIL/uL — ABNORMAL LOW (ref 3.87–5.11)
RDW: 13.9 % (ref 11.5–15.5)
WBC: 11.8 10*3/uL — ABNORMAL HIGH (ref 4.0–10.5)
nRBC: 0 % (ref 0.0–0.2)

## 2022-06-22 LAB — AMNISURE RUPTURE OF MEMBRANE (ROM) NOT AT ARMC: Amnisure ROM: NEGATIVE

## 2022-06-22 LAB — TYPE AND SCREEN
ABO/RH(D): A POS
Antibody Screen: NEGATIVE

## 2022-06-22 LAB — HEMOGLOBIN A1C
Hgb A1c MFr Bld: 5.2 % (ref 4.8–5.6)
Mean Plasma Glucose: 102.54 mg/dL

## 2022-06-22 LAB — POCT FERN TEST: POCT Fern Test: NEGATIVE

## 2022-06-22 MED ORDER — ZOLPIDEM TARTRATE 5 MG PO TABS: 5.0000 mg | ORAL_TABLET | Freq: Every evening | ORAL | Status: DC | PRN

## 2022-06-22 MED ORDER — FENTANYL CITRATE (PF) 100 MCG/2ML IJ SOLN: 100.0000 ug | Freq: Once | INTRAMUSCULAR | Status: AC

## 2022-06-22 MED ORDER — ACETAMINOPHEN 325 MG PO TABS: 650.0000 mg | ORAL_TABLET | ORAL | Status: DC | PRN

## 2022-06-22 MED ORDER — SENNOSIDES-DOCUSATE SODIUM 8.6-50 MG PO TABS
2.0000 | ORAL_TABLET | ORAL | Status: DC
  Administered 2022-06-23 – 2022-06-24 (×2): 2 via ORAL
  Filled 2022-06-22 (×2): qty 2

## 2022-06-22 MED ORDER — METHYLERGONOVINE MALEATE 0.2 MG/ML IJ SOLN: 0.2000 mg | Freq: Once | INTRAMUSCULAR | Status: AC

## 2022-06-22 MED ORDER — WITCH HAZEL-GLYCERIN EX PADS: 1.0000 | MEDICATED_PAD | CUTANEOUS | Status: DC | PRN

## 2022-06-22 MED ORDER — ACETAMINOPHEN 325 MG PO TABS
650.0000 mg | ORAL_TABLET | ORAL | Status: DC | PRN
  Administered 2022-06-23: 650 mg via ORAL
  Filled 2022-06-22: qty 2

## 2022-06-22 MED ORDER — SOD CITRATE-CITRIC ACID 500-334 MG/5ML PO SOLN: 30.0000 mL | ORAL | Status: DC | PRN

## 2022-06-22 MED ORDER — LACTATED RINGERS IV SOLN: 500.0000 mL | INTRAVENOUS | Status: DC | PRN

## 2022-06-22 MED ORDER — TETANUS-DIPHTH-ACELL PERTUSSIS 5-2.5-18.5 LF-MCG/0.5 IM SUSY: 0.5000 mL | PREFILLED_SYRINGE | Freq: Once | INTRAMUSCULAR | Status: DC

## 2022-06-22 MED ORDER — DIPHENHYDRAMINE HCL 25 MG PO CAPS: 25.0000 mg | ORAL_CAPSULE | Freq: Four times a day (QID) | ORAL | Status: DC | PRN

## 2022-06-22 MED ORDER — DIBUCAINE (PERIANAL) 1 % EX OINT: 1.0000 | TOPICAL_OINTMENT | CUTANEOUS | Status: DC | PRN

## 2022-06-22 MED ORDER — METHYLERGONOVINE MALEATE 0.2 MG/ML IJ SOLN
INTRAMUSCULAR | Status: AC
  Administered 2022-06-22: 0.2 mg via INTRAMUSCULAR
  Filled 2022-06-22: qty 1

## 2022-06-22 MED ORDER — OXYTOCIN-SODIUM CHLORIDE 30-0.9 UT/500ML-% IV SOLN
2.5000 [IU]/h | INTRAVENOUS | Status: DC
  Filled 2022-06-22: qty 500

## 2022-06-22 MED ORDER — SIMETHICONE 80 MG PO CHEW: 80.0000 mg | CHEWABLE_TABLET | ORAL | Status: DC | PRN

## 2022-06-22 MED ORDER — COCONUT OIL OIL: 1.0000 | TOPICAL_OIL | Status: DC | PRN

## 2022-06-22 MED ORDER — TERBUTALINE SULFATE 1 MG/ML IJ SOLN: 0.2500 mg | Freq: Once | INTRAMUSCULAR | Status: DC | PRN

## 2022-06-22 MED ORDER — BENZOCAINE-MENTHOL 20-0.5 % EX AERO
1.0000 | INHALATION_SPRAY | CUTANEOUS | Status: DC | PRN
  Administered 2022-06-23: 1 via TOPICAL
  Filled 2022-06-22: qty 56

## 2022-06-22 MED ORDER — OXYTOCIN BOLUS FROM INFUSION
333.0000 mL | Freq: Once | INTRAVENOUS | Status: AC
  Administered 2022-06-22: 333 mL via INTRAVENOUS

## 2022-06-22 MED ORDER — LACTATED RINGERS IV SOLN: INTRAVENOUS | Status: DC

## 2022-06-22 MED ORDER — OXYTOCIN-SODIUM CHLORIDE 30-0.9 UT/500ML-% IV SOLN: 1.0000 m[IU]/min | INTRAVENOUS | Status: DC

## 2022-06-22 MED ORDER — OXYCODONE-ACETAMINOPHEN 5-325 MG PO TABS: 2.0000 | ORAL_TABLET | ORAL | Status: DC | PRN

## 2022-06-22 MED ORDER — LIDOCAINE HCL (PF) 1 % IJ SOLN: 30.0000 mL | INTRAMUSCULAR | Status: DC | PRN

## 2022-06-22 MED ORDER — ONDANSETRON HCL 4 MG PO TABS: 4.0000 mg | ORAL_TABLET | ORAL | Status: DC | PRN

## 2022-06-22 MED ORDER — PRENATAL MULTIVITAMIN CH
1.0000 | ORAL_TABLET | Freq: Every day | ORAL | Status: DC
  Administered 2022-06-23 – 2022-06-24 (×2): 1 via ORAL
  Filled 2022-06-22 (×2): qty 1

## 2022-06-22 MED ORDER — IBUPROFEN 600 MG PO TABS
600.0000 mg | ORAL_TABLET | Freq: Four times a day (QID) | ORAL | Status: DC
  Administered 2022-06-23 (×4): 600 mg via ORAL
  Filled 2022-06-22 (×5): qty 1

## 2022-06-22 MED ORDER — OXYCODONE-ACETAMINOPHEN 5-325 MG PO TABS: 1.0000 | ORAL_TABLET | ORAL | Status: DC | PRN

## 2022-06-22 MED ORDER — FENTANYL CITRATE (PF) 100 MCG/2ML IJ SOLN
INTRAMUSCULAR | Status: AC
  Administered 2022-06-22: 100 ug via INTRAVENOUS
  Filled 2022-06-22: qty 2

## 2022-06-22 MED ORDER — ONDANSETRON HCL 4 MG/2ML IJ SOLN: 4.0000 mg | INTRAMUSCULAR | Status: DC | PRN

## 2022-06-22 MED ORDER — ONDANSETRON HCL 4 MG/2ML IJ SOLN: 4.0000 mg | Freq: Four times a day (QID) | INTRAMUSCULAR | Status: DC | PRN

## 2022-06-22 NOTE — MAU Provider Note (Signed)
History     CSN: [id number]  Arrival date and time: 06/22/22 1226   None     Chief Complaint  Patient presents with   Vaginal Bleeding   Rupture of Membranes   HPI Patient is a 31 yo G3P2002 at 40 weeks. She presents with light vaginal bleeding and a gush of fluid last night. Irregular contractions since then. Having some light bleeding  OB History     Gravida  3   Para  2   Term  2   Preterm      AB      Living  2      SAB      IAB      Ectopic      Multiple      Live Births  2           Past Medical History:  Diagnosis Date   Adrenal adenoma    Anxiety    Chlamydia    Depression    Ear infection     Past Surgical History:  Procedure Laterality Date   LAPAROSCOPIC LYSIS OF ADHESIONS N/A 11/02/2018   Procedure: LAPAROSCOPIC LYSIS OF ADHESIONS;  Surgeon: Emily Filbert, MD;  Location: [facility];  Service: Gynecology;  Laterality: N/A;   LAPAROSCOPY N/A 11/02/2018   Procedure: LAPAROSCOPY DIAGNOSTIC;  Surgeon: Emily Filbert, MD;  Location: [facility];  Service: Gynecology;  Laterality: N/A;   WISDOM TOOTH EXTRACTION      Family History  Problem Relation Age of Onset   Hypertension Father    Diabetes Maternal Aunt    Diabetes Maternal Uncle    Diabetes Maternal Grandmother     Social History   Tobacco Use   Smoking status: Former   Smokeless tobacco: Current   Tobacco comments:    vape  Scientific laboratory technician Use: Former   Substances: Flavoring  Substance Use Topics   Alcohol use: Not Currently    Comment: occ   Drug use: Not Currently    Types: Marijuana    Allergies:  Allergies  Allergen Reactions   Shellfish Allergy Rash and Shortness Of Breath    Medications Prior to Admission  Medication Sig Dispense Refill Last Dose   aspirin 81 MG chewable tablet Chew 1 tablet (81 mg total) by mouth daily. (Patient not taking: Reported on 01/27/2022) 30 tablet 12    ondansetron (ZOFRAN-ODT) 4 MG  disintegrating tablet Take 1 tablet (4 mg total) by mouth every 6 (six) hours as needed for nausea. (Patient not taking: Reported on 04/30/2022) 30 tablet 3    Prenatal Vit-Fe Fumarate-FA (PRENATAL VITAMINS PO) Take by mouth.       Review of Systems Physical Exam   Blood pressure 125/75, pulse 85, temperature 98.1 F (36.7 C), temperature source Oral, resp. rate 17, height '5\' 6"'$  (1.676 m), weight 106.9 kg, last menstrual period 09/15/2021, SpO2 98 %, unknown if currently breastfeeding.  Physical Exam Vitals reviewed. Exam conducted with a chaperone present.  Constitutional:      Appearance: Normal appearance.  Abdominal:     General: Abdomen is flat.     Palpations: Abdomen is soft.  Skin:    General: Skin is warm and dry.     Capillary Refill: Capillary refill takes less than 2 seconds.  Neurological:     General: No focal deficit present.     Mental Status: She is alert.  Psychiatric:        Mood and  Affect: Mood normal.        Behavior: Behavior normal.        Thought Content: Thought content normal.        Judgment: Judgment normal.    Dilation: 5 Effacement (%): 60 Cervical Position: Posterior Station: -3 Exam by:: Mazey Mantell, DO   MAU Course  Procedures NST:  Baseline: 140  Variability: moderate Accelerations: present  Decelerations: variable Contractions: q8 minutes   MDM  Assessment and Plan  Will admit due to variables with contractions and vaginal bleeding last night.  Truett Mainland 06/22/2022, 1:30 PM

## 2022-06-22 NOTE — Telephone Encounter (Signed)
Preadmission screen  

## 2022-06-22 NOTE — Plan of Care (Signed)
  Problem: Education: Goal: Knowledge of Childbirth will improve Outcome: Completed/Met Goal: Ability to make informed decisions regarding treatment and plan of care will improve Outcome: Completed/Met Goal: Ability to state and carry out methods to decrease the pain will improve Outcome: Completed/Met Goal: Individualized Educational Video(s) Outcome: Completed/Met

## 2022-06-22 NOTE — H&P (Cosign Needed Addendum)
OBSTETRIC ADMISSION HISTORY AND PHYSICAL  Diana Fuentes is a 31 y.o. female G3P2002 with IUP at 54w0dpresenting for IOL d/t variable FHR decels. She reports +FMs. No LOF, VB, blurry vision, headaches, peripheral edema, or RUQ pain. She plans on breastfeeding. She requests IUD for birth control.  Dating: By LMP --->  Estimated Date of Delivery: 06/22/22  Sono:    '@[redacted]w[redacted]d'$ , normal anatomy, transverse presentation, 1196g, 70%ile, EFW 2'10  Prenatal History/Complications: -hx VAVD -hx PEC in previous pregnancy -obesity -rubella non-immune -previous child with congenital heart defect -fetal EIF on [place] Past Medical History: Past Medical History:  Diagnosis Date   Adrenal adenoma    Anxiety    Chlamydia    Depression    Ear infection     Past Surgical History: Past Surgical History:  Procedure Laterality Date   LAPAROSCOPIC LYSIS OF ADHESIONS N/A 11/02/2018   Procedure: LAPAROSCOPIC LYSIS OF ADHESIONS;  Surgeon: DEmily Filbert MD;  [place] [facility]  Service: Gynecology;  Laterality: N/A;   LAPAROSCOPY N/A 11/02/2018   Procedure: LAPAROSCOPY DIAGNOSTIC;  Surgeon: DEmily Filbert MD;  [place] [facility]  Service: Gynecology;  Laterality: N/A;   WISDOM TOOTH EXTRACTION      Obstetrical History: OB History     Gravida  3   Para  2   Term  2   Preterm      AB      Living  2      SAB      IAB      Ectopic      Multiple      Live Births  2           Social History: Social History   Socioeconomic History   Marital status: Legally Separated    Spouse name: Not on file   Number of children: 1   Years of education: Not on file   Highest education level: High school graduate  Occupational History   Not on file  Tobacco Use   Smoking status: Former   Smokeless tobacco: Current   Tobacco comments:    vape  VScientific laboratory technicianUse: Former   Substances: Flavoring  Substance and Sexual Activity   Alcohol use: Not  Currently    Comment: occ   Drug use: Not Currently    Types: Marijuana   Sexual activity: Yes    Birth control/protection: None  Other Topics Concern   Not on file  Social History Narrative   Not on file   Social Determinants of Health   Financial Resource Strain: Not on file  Food Insecurity: Not on file  Transportation Needs: Not on file  Physical Activity: Not on file  Stress: Not on file  Social Connections: Not on file    Family History: Family History  Problem Relation Age of Onset   Hypertension Father    Diabetes Maternal Aunt    Diabetes Maternal Uncle    Diabetes Maternal Grandmother     Allergies: Allergies  Allergen Reactions   Shellfish Allergy Rash and Shortness Of Breath    Medications Prior to Admission  Medication Sig Dispense Refill Last Dose   aspirin 81 MG chewable tablet Chew 1 tablet (81 mg total) by mouth daily. (Patient not taking: Reported on 01/27/2022) 30 tablet 12    ondansetron (ZOFRAN-ODT) 4 MG disintegrating tablet Take 1 tablet (4 mg total) by mouth every 6 (six) hours as needed for nausea. (Patient not taking: Reported  on 04/30/2022) 30 tablet 3    Prenatal Vit-Fe Fumarate-FA (PRENATAL VITAMINS PO) Take by mouth.        Review of Systems:  All systems reviewed and negative except as stated in HPI  PE: Blood pressure 125/75, pulse 85, temperature 98.1 F (36.7 C), temperature source Oral, resp. rate 17, height '5\' 6"'$  (1.676 m), weight 106.9 kg, last menstrual period 09/15/2021, SpO2 98 %, unknown if currently breastfeeding. General appearance: alert, cooperative, and no distress Lungs: regular rate and effort Heart: regular rate  Abdomen: soft, non-tender Extremities: Homans sign is negative, no sign of DVT Presentation: cephalic EFM: 948 bpm, mod variability, + accels, variable decels Toco: 7-8 Dilation: 5 Effacement (%): 60 Station: -3 Exam by:: Stinson, DO  Prenatal labs: ABO, Rh: A/Positive/-- (04/04 1008) Antibody:  Negative (04/04 1008) Rubella: <0.90 (04/04 1008) RPR: Non Reactive (04/04 1008)  HBsAg: Negative (04/04 1008)  HIV: Non Reactive (04/04 1008)  GBS: Negative/-- (09/14 1500)  2 hr GTT not done  Prenatal Transfer Tool  Maternal Diabetes: No Genetic Screening: Normal Maternal Ultrasounds/Referrals: Isolated EIF (echogenic intracardiac focus) Fetal Ultrasounds or other Referrals:  Referred to Materal Fetal Medicine  Maternal Substance Abuse:  No Significant Maternal Medications:  None Significant Maternal Lab Results: Group B Strep negative  Results for orders placed or performed during the hospital encounter of 06/22/22 (from the past 24 hour(s))  Amnisure rupture of membrane (rom)not at [facility]   Collection Time: 06/22/22  1:26 PM  Result Value Ref Range   Amnisure ROM NEGATIVE     Patient Active Problem List   Diagnosis Date Noted   Encounter for induction of labor 06/22/2022   Supervision of other normal pregnancy, antepartum 12/16/2021   Rubella non-immune status, antepartum 11/27/2020   Hx of preeclampsia, prior pregnancy, currently pregnant 08/20/2020   Adrenal adenoma, left 01/11/2018   Depression 08/18/2014    Assessment: Diana Fuentes is a 31 y.o. G3P2002 at 38w0dhere for IOL for NRNST  1. Labor: latent 2. FWB: Cat II 3. Pain: analgesia/anesthesia prn 4. GBS: neg   Plan: Admit to LD IOL Anticipate SVD  MJulianne Handler CNM  06/22/2022, 2:28 PM

## 2022-06-22 NOTE — Progress Notes (Signed)
Labor Progress Note Diana Fuentes is a 31 y.o. G3P2002 at 69w0dpresented for IOL for NRNST  S:  Feeling more painful and regular ctx.  O:  BP (!) 157/92   Pulse 84   Temp 98.8 F (37.1 C) (Oral)   Resp 19   Ht '5\' 6"'$  (1.676 m)   Wt 106.9 kg   LMP 09/15/2021   SpO2 98%   BMI 38.03 kg/m  EFM: baseline 150 bpm/ mod variability/ + accels/ early, variable decels  Toco/IUPC: 2-4 SVE: Dilation: 7 Effacement (%): 70 Cervical Position: Middle Station: -2 Presentation: Vertex Exam by:: MJulianne Handler CNM  A/P: 31y.o. G3P2002 436w0d1. Labor: active, progressing well 2. FWB: Cat II 3. Pain: analgesia/anesthesia/NO prn  Expectant mngt. Anticipate labor progress and SVD.  MeJulianne HandlerCNM 7:55 PM

## 2022-06-22 NOTE — Discharge Summary (Addendum)
Postpartum Discharge Summary  Date of Service updated 06/24/22     Patient Name: Diana Fuentes DOB: October 11, 1990 MRN: [id number]  Date of admission: 06/22/2022 Delivery date:06/22/2022  Delivering provider: Concepcion Living  Date of discharge: 06/24/2022  Admitting diagnosis: Encounter for induction of labor [Z34.90] Intrauterine pregnancy: [redacted]w[redacted]d    Secondary diagnosis:  Active Problems:   Hx of preeclampsia, prior pregnancy, currently pregnant   Rubella non-immune status, antepartum   Supervision of other normal pregnancy, antepartum   Encounter for induction of labor   Vaginal delivery  Additional problems: Hypertension    Discharge diagnosis: Term Pregnancy Delivered                                              Post partum procedures: none Augmentation: AROM Complications: None  Hospital course: Induction of Labor With Vaginal Delivery   31y.o. yo G3P2002 at 465w0das admitted to the hospital 06/22/2022 for induction of labor.  Indication for induction:  NRNST .  Patient had an labor course complicated byNA Membrane Rupture Time/Date: 3:43 PM ,06/22/2022   Delivery Method:Vaginal, Spontaneous  Episiotomy: None  Lacerations:  None  Details of delivery can be found in separate delivery note.  Patient had a postpartum course complicated by BPGU>440/34started on procardia and lasix. Patient is discharged home 06/24/22.  Newborn Data: Birth date:06/22/2022  Birth time:9:22 PM  Gender:Female  Living status:Living  Apgars:8 ,9  Weight:3780 g   Magnesium Sulfate received: No BMZ received: No Rhophylac:N/A MMR:No T-DaP: declined Flu: No Transfusion:No  Physical exam  Vitals:   06/23/22 1208 06/23/22 1605 06/23/22 2017 06/24/22 0555  BP: 118/69 127/75 135/86 121/78  Pulse: 64 60 68 67  Resp:  '20 18 18  ' Temp:  98.1 F (36.7 C) 98.1 F (36.7 C) 97.8 F (36.6 C)  TempSrc:  Oral Oral Oral  SpO2:  100% 100% 97%  Weight:      Height:       General: alert,  cooperative, and no distress Lochia: appropriate Uterine Fundus: firm DVT Evaluation: No evidence of DVT seen on physical exam. Negative Homan's sign. No cords or calf tenderness. No significant calf/ankle edema. Labs: Lab Results  Component Value Date   WBC 11.8 (H) 06/22/2022   HGB 11.2 (L) 06/22/2022   HCT 33.8 (L) 06/22/2022   MCV 89.2 06/22/2022   PLT 271 06/22/2022      Latest Ref Rng & Units 12/16/2021   10:08 AM  CMP  Glucose 70 - 99 mg/dL 84   BUN 6 - 20 mg/dL 5   Creatinine 0.57 - 1.00 mg/dL 0.55   Sodium 134 - 144 mmol/L 137   Potassium 3.5 - 5.2 mmol/L 4.5   Chloride 96 - 106 mmol/L 104   CO2 20 - 29 mmol/L 18   Calcium 8.7 - 10.2 mg/dL 9.4   Total Protein 6.0 - 8.5 g/dL 6.9   Total Bilirubin 0.0 - 1.2 mg/dL 0.3   Alkaline Phos 44 - 121 IU/L 59   AST 0 - 40 IU/L 11   ALT 0 - 32 IU/L 9    Edinburgh Score:    06/23/2022    9:45 AM  Edinburgh Postnatal Depression Scale Screening Tool  I have been able to laugh and see the funny side of things. 0  I have looked forward with enjoyment to things. 0  I have blamed myself unnecessarily when things went wrong. 0  I have been anxious or worried for no good reason. 0  I have felt scared or panicky for no good reason. 0  Things have been getting on top of me. 1  I have been so unhappy that I have had difficulty sleeping. 0  I have felt sad or miserable. 0  I have been so unhappy that I have been crying. 0  The thought of harming myself has occurred to me. 0  Edinburgh Postnatal Depression Scale Total 1     After visit meds:  Allergies as of 06/24/2022       Reactions   Shellfish Allergy Rash, Shortness Of Breath        Medication List     STOP taking these medications    aspirin 81 MG chewable tablet   ondansetron 4 MG disintegrating tablet Commonly known as: ZOFRAN-ODT       TAKE these medications    acetaminophen 325 MG tablet Commonly known as: Tylenol Take 2 tablets (650 mg total) by  mouth every 4 (four) hours as needed (for pain scale < 4).   coconut oil Oil Apply 1 Application topically as needed.   furosemide 20 MG tablet Commonly known as: LASIX Take 1 tablet (20 mg total) by mouth daily for 3 days.   ibuprofen 600 MG tablet Commonly known as: ADVIL Take 1 tablet (600 mg total) by mouth every 6 (six) hours.   NIFEdipine 30 MG 24 hr tablet Commonly known as: ADALAT CC Take 1 tablet (30 mg total) by mouth daily.   PRENATAL VITAMINS PO Take by mouth.         Discharge home in stable condition Infant Feeding: Breast Infant Disposition:home with mother Discharge instruction: per After Visit Summary and Postpartum booklet. Activity: Advance as tolerated. Pelvic rest for 6 weeks.  Diet: routine diet Future Appointments: Future Appointments  Date Time Provider [place]  08/04/2022 10:35 AM Seabron Spates, CNM [facility] None   Follow up Visit:  Message sent 06/22/22  Please schedule this patient for a In person postpartum visit in 4 weeks with the following provider: Any provider. Additional Postpartum F/U: None    Low risk pregnancy complicated by:  None  Delivery mode:  Vaginal, Spontaneous  Anticipated Birth Control:  Unsure and considering outpatient IUD  Unable to see appointment for BP check. High alert message sent to [facility] 06/24/2022 to ensure patient is scheduled for BP check in 7-10 days  Order placed for Postpartum Baby [place], [place], MSN, CNM Certified Nurse Midwife, Product/process development scientist for [employer], [place]

## 2022-06-22 NOTE — MAU Note (Signed)
Diana Fuentes is a 31 y.o. at 46w0dhere in MAU reporting: she had some VB and a gush of fluid yesterday.  Reports she hasn't continues to leak fluid, had light pink discharge with wiping this morning.  States having "light" ctxs that aren't regular. Endorses +FM. LMP: N/A Onset of complaint: yesterday Pain score: 4 Vitals:   06/22/22 1237  BP: 125/75  Pulse: 85  Resp: 17  Temp: 98.1 F (36.7 C)  SpO2: 98%     FHT:152 bpm Lab orders placed from triage:   None

## 2022-06-22 NOTE — Progress Notes (Signed)
Labor Progress Note Diana Fuentes is a 31 y.o. G3P2002 at 68w0dpresented for IOL for NRNST  S:  Comfortable, no complaints.  O:  BP 125/75 (BP Location: Right Arm)   Pulse 85   Temp 98.4 F (36.9 C) (Oral)   Resp 20   Ht '5\' 6"'$  (1.676 m)   Wt 106.9 kg   LMP 09/15/2021   SpO2 98%   BMI 38.03 kg/m  EFM: baseline 140 bpm/ mod variability/ + accels/ variable decels  Toco/IUPC: 7-8 SVE: Dilation: 5.5 Effacement (%): 70 Cervical Position: Middle Station: -3 Exam by:: Diana Fuentes CNM   A/P: 31y.o. G3P2002 458w0d1. Labor: latent 2. FWB: Cat II 3. Pain: analgesia/anesthesia/NO prn   Consented for AROM, AROM'd for mod amt thick MSF. Anticipate labor progress and SVD.  MeJulianne HandlerCNM 6:19 PM

## 2022-06-22 NOTE — Lactation Note (Signed)
This note was copied from a baby's chart. Lactation Consultation Note  Patient Name: Diana Fuentes SELTR'V Date: 06/22/2022   Age:29 hours P3, term female infant. Per RN Caesar Bookman) on L&D, Birth Parent is confident with breastfeeding infant and doesn't want [facility] services in L&D and will call PRN on MBU. Maternal Data    Feeding    LATCH Score                    Lactation Tools Discussed/Used    Interventions    Discharge    Consult Status      Vicente Serene 06/22/2022, 9:46 PM

## 2022-06-23 LAB — RPR: RPR Ser Ql: NONREACTIVE

## 2022-06-23 MED ORDER — NIFEDIPINE ER OSMOTIC RELEASE 30 MG PO TB24
30.0000 mg | ORAL_TABLET | Freq: Every day | ORAL | Status: DC
  Administered 2022-06-23 – 2022-06-24 (×2): 30 mg via ORAL
  Filled 2022-06-23 (×2): qty 1

## 2022-06-23 MED ORDER — FUROSEMIDE 20 MG PO TABS: 20.0000 mg | ORAL_TABLET | Freq: Every day | ORAL | Status: DC

## 2022-06-23 MED ORDER — FUROSEMIDE 20 MG PO TABS
20.0000 mg | ORAL_TABLET | Freq: Every day | ORAL | Status: DC
  Administered 2022-06-24: 20 mg via ORAL
  Filled 2022-06-23: qty 1

## 2022-06-23 NOTE — Progress Notes (Signed)
Post Partum Day 1 Subjective: no complaints, up ad lib, voiding, and tolerating PO  Objective: Blood pressure (!) 144/95, pulse 62, temperature 98.3 F (36.8 C), temperature source Oral, resp. rate 20, height '5\' 6"'$  (1.676 m), weight 106.9 kg, last menstrual period 09/15/2021, SpO2 98 %, unknown if currently breastfeeding.  Physical Exam:  General: alert, cooperative, and no distress Lochia: appropriate Uterine Fundus: firm Incision: n/a DVT Evaluation: No evidence of DVT seen on physical exam. Negative Homan's sign.  Recent Labs    06/22/22 1405  HGB 11.2*  HCT 33.8*    Assessment/Plan: Plan for discharge tomorrow BP slightly elevated - start procardia and lasix.   LOS: 1 day   Truett Mainland, DO 06/23/2022, 12:06 PM

## 2022-06-23 NOTE — Social Work (Signed)
[name] was referred for history of depression/anxiety.  * Referral screened out by Clinical Social Worker because none of the following criteria appear to apply:  ~ History of anxiety/depression during this pregnancy, or of post-partum depression following prior delivery.  ~ Diagnosis of anxiety and/or depression within last 3 years OR * [name]'s symptoms currently being treated with medication and/or therapy.  Per chart review, [name] reported stable mood throughout pregnancy, [name]'s diagnosis was prior to October 2020.   Please contact the Clinical Social Worker if needs arise or by [name] request.  Letta Kocher, [place] Social Worker [phone]

## 2022-06-23 NOTE — Progress Notes (Signed)
Post Partum Day 1  Subjective: no complaints, voiding, and tolerating PO  Objective: Blood pressure 118/69, pulse 64, temperature 98.3 F (36.8 C), temperature source Oral, resp. rate 20, height '5\' 6"'$  (1.676 m), weight 106.9 kg, last menstrual period 09/15/2021, SpO2 98 %, unknown if currently breastfeeding.  Physical Exam:  General: alert and cooperative Lochia: appropriate Uterine Fundus: firm DVT Evaluation: No evidence of DVT seen on physical exam. Negative Homan's sign. No cords or calf tenderness. No significant calf/ankle edema.  Recent Labs    06/22/22 1405  HGB 11.2*  HCT 33.8*    Assessment/Plan: Plan for discharge tomorrow BP elevated last night, came down this morning. Started on procardia and lasix.  Planning to discuss contraception with at [facility] f/u.    LOS: 1 day   Cecilio Asper, MD 06/23/2022, 12:19 PM

## 2022-06-24 ENCOUNTER — Other Ambulatory Visit (HOSPITAL_COMMUNITY): Payer: Self-pay

## 2022-06-24 MED ORDER — NIFEDIPINE ER 30 MG PO TB24
30.0000 mg | ORAL_TABLET | Freq: Every day | ORAL | 0 refills | Status: DC
  Filled 2022-06-24: qty 30, 30d supply, fill #0

## 2022-06-24 MED ORDER — FUROSEMIDE 20 MG PO TABS
20.0000 mg | ORAL_TABLET | Freq: Every day | ORAL | 0 refills | Status: DC
  Filled 2022-06-24: qty 3, 3d supply, fill #0

## 2022-06-24 MED ORDER — IBUPROFEN 600 MG PO TABS
600.0000 mg | ORAL_TABLET | Freq: Four times a day (QID) | ORAL | 0 refills | Status: DC
  Filled 2022-06-24: qty 30, 8d supply, fill #0

## 2022-06-24 MED ORDER — ACETAMINOPHEN 325 MG PO TABS
650.0000 mg | ORAL_TABLET | ORAL | 0 refills | Status: DC | PRN
  Filled 2022-06-24: qty 30, 3d supply, fill #0

## 2022-06-24 MED ORDER — FUROSEMIDE 20 MG PO TABS
20.0000 mg | ORAL_TABLET | Freq: Every day | ORAL | 0 refills | Status: DC
  Filled 2022-06-24: qty 30, 30d supply, fill #0

## 2022-06-24 MED ORDER — COCONUT OIL OIL
1.0000 | TOPICAL_OIL | 0 refills | Status: DC | PRN
  Filled 2022-06-24: qty 120, fill #0

## 2022-06-24 NOTE — Discharge Instructions (Signed)

## 2022-06-24 NOTE — Lactation Note (Signed)
This note was copied from a baby's chart. Lactation Consultation Note  Patient Name: Diana Fuentes LHTDS'K Date: 06/24/2022 Reason for consult:  (Birth parent has not requested [facility] yesterday or today. see initially noted from [facility] on the delivery day.) Age:29 hours  Maternal Data    Feeding Mother's Current Feeding Choice: Breast Milk     Consult Status Consult Status: Complete Date: 06/24/22    Diana Fuentes 06/24/2022, 1:24 PM

## 2022-06-24 NOTE — Progress Notes (Signed)
Called and talked to Diana Fuentes, CNM re: patient's current BP readings.  Have repeated BP measurement several times since Procardia administration.  Told clinician current BP.  Patient cleared for discharge with recheck BP appt scheduled.

## 2022-06-25 ENCOUNTER — Encounter: Payer: Medicaid Other | Admitting: Family Medicine

## 2022-06-25 ENCOUNTER — Other Ambulatory Visit: Payer: Medicaid Other

## 2022-06-29 ENCOUNTER — Inpatient Hospital Stay (HOSPITAL_COMMUNITY): Payer: Medicaid Other

## 2022-06-29 ENCOUNTER — Inpatient Hospital Stay (HOSPITAL_COMMUNITY): Admission: AD | Admit: 2022-06-29 | Payer: Medicaid Other | Source: Home / Self Care | Admitting: Family Medicine

## 2022-07-01 ENCOUNTER — Ambulatory Visit: Payer: Medicaid Other

## 2022-07-03 ENCOUNTER — Telehealth (HOSPITAL_COMMUNITY): Payer: Self-pay | Admitting: *Deleted

## 2022-07-03 NOTE — Telephone Encounter (Signed)
Mom reports feeling good. No concerns about herself at this time. EPDS=1 [facility] score=1) Mom reports baby is doing well. Feeding, peeing, and pooping without difficulty. Safe sleep reviewed. Mom reports no concerns about baby at present.  Odis Hollingshead, RN 07-03-2022 at 10:20am

## 2022-08-04 ENCOUNTER — Ambulatory Visit: Payer: Medicaid Other | Admitting: Advanced Practice Midwife

## 2022-08-25 ENCOUNTER — Ambulatory Visit: Payer: Medicaid Other

## 2022-09-14 NOTE — L&D Delivery Note (Signed)
OB/GYN Faculty Practice Delivery Note  Diana Fuentes is a 32 y.o. [id number] s/p SVD at [redacted]w[redacted]d. She was admitted for SOL.   ROM: delivered en caul with clear fluid GBS Status: neg Maximum Maternal Temperature: 98.7  Labor Progress: [name] was admitted in early active labor; she progressed spontaneously to complete and pushed for ~10 mins to SVD.  Delivery Date/Time: November 14th, 2024 at 0413 Delivery: Called to room and patient was complete and pushing. Head delivered ROA. No nuchal cord present. Shoulder and body delivered in usual fashion. Infant with spontaneous cry, placed on mother's abdomen, dried and stimulated. Cord clamped x 2 after 1-minute delay, and cut by mother of pt. Cord blood drawn. Placenta delivered spontaneously with gentle cord traction. Fundus firm with massage and Pitocin. Labia, perineum, vagina, and cervix inspected and found to be intact.   Placenta: spont, intact; to L&D Complications: none Lacerations: none EBL: 107cc Analgesia: none  Postpartum Planning [x]  message to sent to schedule follow-up  [x]  Lasix 20mg  x 5d/ M2B  Infant: boy  APGARs 9/9  3550g (7lb 13.2oz)  Arabella Merles, CNM  07/29/2023 4:33 AM

## 2022-10-11 ENCOUNTER — Other Ambulatory Visit (HOSPITAL_COMMUNITY): Payer: Self-pay

## 2022-12-07 ENCOUNTER — Other Ambulatory Visit (HOSPITAL_COMMUNITY)
Admission: RE | Admit: 2022-12-07 | Discharge: 2022-12-07 | Disposition: A | Discharge status: Home / Self Care | Payer: Medicaid Other | Source: Ambulatory Visit | Attending: Obstetrics and Gynecology | Admitting: Obstetrics and Gynecology

## 2022-12-07 ENCOUNTER — Ambulatory Visit: Payer: Medicaid Other

## 2022-12-07 VITALS — BP 131/84 | HR 80

## 2022-12-07 DIAGNOSIS — N898 Other specified noninflammatory disorders of vagina: Secondary | ICD-10-CM | POA: Diagnosis not present

## 2022-12-07 MED ORDER — FLUCONAZOLE 150 MG PO TABS: 150.0000 mg | ORAL_TABLET | Freq: Once | ORAL | 1 refills | Status: AC

## 2022-12-07 NOTE — Progress Notes (Signed)
SUBJECTIVE:  32 y.o. female complains of white vaginal discharge with itching for 3 day(s). Denies abnormal vaginal bleeding or significant pelvic pain or fever. No UTI symptoms. Denies history of known exposure to STD.  LMP:   OBJECTIVE:  She appears well, afebrile.  ASSESSMENT:  Vaginal Discharge  Vaginal Odor   PLAN:  GC, chlamydia, trichomonas, BVAG, CVAG probe sent to lab. Treatment: To be determined once lab results are received ROV prn if symptoms persist or worsen.

## 2022-12-08 LAB — CERVICOVAGINAL ANCILLARY ONLY
Bacterial Vaginitis (gardnerella): NEGATIVE
Candida Glabrata: NEGATIVE
Candida Vaginitis: POSITIVE — AB
Chlamydia: NEGATIVE
Comment: NEGATIVE
Comment: NEGATIVE
Comment: NEGATIVE
Comment: NEGATIVE
Comment: NEGATIVE
Comment: NORMAL
Neisseria Gonorrhea: NEGATIVE
Trichomonas: NEGATIVE

## 2022-12-09 ENCOUNTER — Other Ambulatory Visit: Payer: Self-pay | Admitting: Obstetrics and Gynecology

## 2022-12-09 DIAGNOSIS — B3731 Acute candidiasis of vulva and vagina: Secondary | ICD-10-CM

## 2022-12-09 MED ORDER — FLUCONAZOLE 150 MG PO TABS: 150.0000 mg | ORAL_TABLET | Freq: Once | ORAL | 3 refills | Status: AC

## 2022-12-28 ENCOUNTER — Encounter (HOSPITAL_COMMUNITY): Payer: Self-pay | Admitting: *Deleted

## 2022-12-28 ENCOUNTER — Inpatient Hospital Stay (HOSPITAL_COMMUNITY)
Admission: AD | Admit: 2022-12-28 | Discharge: 2022-12-28 | Disposition: A | Discharge status: Home / Self Care | Payer: Medicaid Other | Attending: Obstetrics & Gynecology | Admitting: Obstetrics & Gynecology

## 2022-12-28 DIAGNOSIS — O219 Vomiting of pregnancy, unspecified: Secondary | ICD-10-CM | POA: Diagnosis present

## 2022-12-28 DIAGNOSIS — Z87891 Personal history of nicotine dependence: Secondary | ICD-10-CM | POA: Diagnosis not present

## 2022-12-28 DIAGNOSIS — M549 Dorsalgia, unspecified: Secondary | ICD-10-CM | POA: Diagnosis not present

## 2022-12-28 DIAGNOSIS — Z3A01 Less than 8 weeks gestation of pregnancy: Secondary | ICD-10-CM | POA: Insufficient documentation

## 2022-12-28 DIAGNOSIS — O26891 Other specified pregnancy related conditions, first trimester: Secondary | ICD-10-CM | POA: Diagnosis not present

## 2022-12-28 DIAGNOSIS — R8290 Unspecified abnormal findings in urine: Secondary | ICD-10-CM | POA: Insufficient documentation

## 2022-12-28 DIAGNOSIS — R030 Elevated blood-pressure reading, without diagnosis of hypertension: Secondary | ICD-10-CM | POA: Diagnosis not present

## 2022-12-28 LAB — URINALYSIS, ROUTINE W REFLEX MICROSCOPIC
Bilirubin Urine: NEGATIVE
Glucose, UA: NEGATIVE mg/dL
Hgb urine dipstick: NEGATIVE
Ketones, ur: 20 mg/dL — AB
Leukocytes,Ua: NEGATIVE
Nitrite: POSITIVE — AB
Protein, ur: >=300 mg/dL — AB
Specific Gravity, Urine: 1.03 (ref 1.005–1.030)
pH: 5 (ref 5.0–8.0)

## 2022-12-28 LAB — COMPREHENSIVE METABOLIC PANEL
ALT: 18 U/L (ref 0–44)
AST: 22 U/L (ref 15–41)
Albumin: 4.3 g/dL (ref 3.5–5.0)
Alkaline Phosphatase: 49 U/L (ref 38–126)
Anion gap: 13 (ref 5–15)
BUN: 11 mg/dL (ref 6–20)
CO2: 21 mmol/L — ABNORMAL LOW (ref 22–32)
Calcium: 9.3 mg/dL (ref 8.9–10.3)
Chloride: 101 mmol/L (ref 98–111)
Creatinine, Ser: 0.72 mg/dL (ref 0.44–1.00)
GFR, Estimated: >60 mL/min (ref >60)
Glucose, Bld: 134 mg/dL — ABNORMAL HIGH (ref 70–99)
Potassium: 3.9 mmol/L (ref 3.5–5.1)
Sodium: 135 mmol/L (ref 135–145)
Total Bilirubin: 1 mg/dL (ref 0.3–1.2)
Total Protein: 7.7 g/dL (ref 6.5–8.1)

## 2022-12-28 LAB — CBC
HCT: 39 % (ref 36.0–46.0)
Hemoglobin: 12.9 g/dL (ref 12.0–15.0)
MCH: 29.4 pg (ref 26.0–34.0)
MCHC: 33.1 g/dL (ref 30.0–36.0)
MCV: 88.8 fL (ref 80.0–100.0)
Platelets: 385 10*3/uL (ref 150–400)
RBC: 4.39 MIL/uL (ref 3.87–5.11)
RDW: 13 % (ref 11.5–15.5)
WBC: 19.8 10*3/uL — ABNORMAL HIGH (ref 4.0–10.5)
nRBC: 0 % (ref 0.0–0.2)

## 2022-12-28 LAB — POCT PREGNANCY, URINE: Preg Test, Ur: POSITIVE — AB

## 2022-12-28 MED ORDER — ONDANSETRON HCL 4 MG/2ML IJ SOLN
4.0000 mg | Freq: Once | INTRAMUSCULAR | Status: AC
  Administered 2022-12-28: 4 mg via INTRAVENOUS
  Filled 2022-12-28: qty 2

## 2022-12-28 MED ORDER — FAMOTIDINE 10 MG PO TABS: 10.0000 mg | ORAL_TABLET | Freq: Two times a day (BID) | ORAL | 0 refills | Status: DC

## 2022-12-28 MED ORDER — ONDANSETRON 4 MG PO TBDP: 4.0000 mg | ORAL_TABLET | Freq: Four times a day (QID) | ORAL | 0 refills | Status: DC | PRN

## 2022-12-28 MED ORDER — LACTATED RINGERS IV BOLUS
1000.0000 mL | Freq: Once | INTRAVENOUS | Status: AC
  Administered 2022-12-28: 1000 mL via INTRAVENOUS

## 2022-12-28 MED ORDER — ONDANSETRON 4 MG PO TBDP: 4.0000 mg | ORAL_TABLET | Freq: Once | ORAL | Status: DC

## 2022-12-28 MED ORDER — CEFADROXIL 500 MG PO CAPS: 500.0000 mg | ORAL_CAPSULE | Freq: Two times a day (BID) | ORAL | 0 refills | Status: DC

## 2022-12-28 NOTE — MAU Provider Note (Signed)
History     CSN: [id number]  Arrival date and time: 12/28/22 1836   Event Date/Time   First Provider Initiated Contact with Patient 12/28/22 2119      Chief Complaint  Patient presents with   Emesis   Back Pain   HPI Diana Fuentes is a 32 y.o. G4P3003 at [redacted]w[redacted]d by LMP. She presents to MAU with chief complaint of vomiting and back pain. These are new complaints, onset 12/28/2022 at 0100. No one else in the home is sick. She denies fever, abdominal pain, vaginal bleeding, and dysuria. She does not have access to antiemetics and so has not taken medication for these complaints.  OB History     Gravida  4   Para  3   Term  3   Preterm      AB      Living  3      SAB      IAB      Ectopic      Multiple  0   Live Births  3           Past Medical History:  Diagnosis Date   Adrenal adenoma    Anxiety    Chlamydia    Depression    Ear infection     Past Surgical History:  Procedure Laterality Date   LAPAROSCOPIC LYSIS OF ADHESIONS N/A 11/02/2018   Procedure: LAPAROSCOPIC LYSIS OF ADHESIONS;  Surgeon: Allie Bossier, MD;  Location: [facility];  Service: Gynecology;  Laterality: N/A;   LAPAROSCOPY N/A 11/02/2018   Procedure: LAPAROSCOPY DIAGNOSTIC;  Surgeon: Allie Bossier, MD;  Location: [facility];  Service: Gynecology;  Laterality: N/A;   WISDOM TOOTH EXTRACTION      Family History  Problem Relation Age of Onset   Hypertension Father    Diabetes Maternal Aunt    Diabetes Maternal Uncle    Diabetes Maternal Grandmother     Social History   Tobacco Use   Smoking status: Former   Smokeless tobacco: Current   Tobacco comments:    vape  Building services engineer Use: Former   Substances: Flavoring  Substance Use Topics   Alcohol use: Not Currently    Comment: occ   Drug use: Not Currently    Types: Marijuana    Allergies:  Allergies  Allergen Reactions   Shellfish Allergy Rash and Shortness Of Breath     Medications Prior to Admission  Medication Sig Dispense Refill Last Dose   acetaminophen (TYLENOL) 325 MG tablet Take 2 tablets (650 mg total) by mouth every 4 (four) hours as needed (for pain scale < 4). 30 tablet 0    coconut oil OIL Apply 1 Application topically as needed. 120 mL 0    furosemide (LASIX) 20 MG tablet Take 1 tablet (20 mg total) by mouth daily for 3 days. 3 tablet 0    ibuprofen (ADVIL) 600 MG tablet Take 1 tablet (600 mg total) by mouth every 6 (six) hours. 30 tablet 0    NIFEdipine (ADALAT CC) 30 MG 24 hr tablet Take 1 tablet (30 mg total) by mouth daily. 30 tablet 0    Prenatal Vit-Fe Fumarate-FA (PRENATAL VITAMINS PO) Take by mouth.       Review of Systems  Constitutional:  Positive for fatigue.  Gastrointestinal:  Positive for nausea and vomiting.  Musculoskeletal:  Positive for back pain.  All other systems reviewed and are negative.  Physical Exam   Blood pressure Marland Kitchen)  148/88, pulse 68, temperature 99.3 F (37.4 C), temperature source Oral, resp. rate 16, height 5\' 6"  (1.676 m), weight 88 kg, last menstrual period 11/08/2022, unknown if currently breastfeeding.  Physical Exam Vitals and nursing note reviewed. Exam conducted with a chaperone present.  Constitutional:      Appearance: She is ill-appearing.  HENT:     Mouth/Throat:     Mouth: Mucous membranes are dry.     Pharynx: Oropharynx is clear. Uvula midline.  Cardiovascular:     Rate and Rhythm: Normal rate and regular rhythm.     Pulses: Normal pulses.     Heart sounds: Normal heart sounds.  Pulmonary:     Effort: Pulmonary effort is normal.     Breath sounds: Normal breath sounds.  Abdominal:     General: Abdomen is flat.     Tenderness: There is no abdominal tenderness. There is no right CVA tenderness or left CVA tenderness.  Skin:    Capillary Refill: Capillary refill takes less than 2 seconds.  Neurological:     Mental Status: She is alert and oriented to person, place, and time.   Psychiatric:        Mood and Affect: Mood normal.        Behavior: Behavior normal.        Thought Content: Thought content normal.        Judgment: Judgment normal.     MAU Course  Procedures  MDM  Orders Placed This Encounter  Procedures   Culture, OB Urine   Urinalysis, Routine w reflex microscopic -Urine, Clean Catch   CBC   Comprehensive metabolic panel   Pregnancy, urine POC   Insert peripheral IV   Discharge patient   Patient Vitals for the past 24 hrs:  BP Temp Temp src Pulse Resp SpO2 Height Weight  12/28/22 2225 (!) 145/80 -- -- 63 -- 100 % -- --  12/28/22 1915 (!) 148/88 99.3 F (37.4 C) Oral 68 16 -- 5\' 6"  (1.676 m) 88 kg   Results for orders placed or performed during the hospital encounter of 12/28/22 (from the past 24 hour(s))  Urinalysis, Routine w reflex microscopic -Urine, Clean Catch     Status: Abnormal   Collection Time: 12/28/22  7:25 PM  Result Value Ref Range   Color, Urine YELLOW YELLOW   APPearance TURBID (A) CLEAR   Specific Gravity, Urine 1.030 1.005 - 1.030   pH 5.0 5.0 - 8.0   Glucose, UA NEGATIVE NEGATIVE mg/dL   Hgb urine dipstick NEGATIVE NEGATIVE   Bilirubin Urine NEGATIVE NEGATIVE   Ketones, ur 20 (A) NEGATIVE mg/dL   Protein, ur >=409 (A) NEGATIVE mg/dL   Nitrite POSITIVE (A) NEGATIVE   Leukocytes,Ua NEGATIVE NEGATIVE   RBC / HPF 0-5 0 - 5 RBC/hpf   WBC, UA 0-5 0 - 5 WBC/hpf   Bacteria, UA RARE (A) NONE SEEN   Squamous Epithelial / HPF 0-5 0 - 5 /HPF   Mucus PRESENT    Amorphous Crystal PRESENT   Pregnancy, urine POC     Status: Abnormal   Collection Time: 12/28/22  7:27 PM  Result Value Ref Range   Preg Test, Ur POSITIVE (A) NEGATIVE  CBC     Status: Abnormal   Collection Time: 12/28/22  8:39 PM  Result Value Ref Range   WBC 19.8 (H) 4.0 - 10.5 K/uL   RBC 4.39 3.87 - 5.11 MIL/uL   Hemoglobin 12.9 12.0 - 15.0 g/dL   HCT 81.1 91.4 - 78.2 %  MCV 88.8 80.0 - 100.0 fL   MCH 29.4 26.0 - 34.0 pg   MCHC 33.1 30.0 - 36.0  g/dL   RDW 65.7 84.6 - 96.2 %   Platelets 385 150 - 400 K/uL   nRBC 0.0 0.0 - 0.2 %  Comprehensive metabolic panel     Status: Abnormal   Collection Time: 12/28/22  8:39 PM  Result Value Ref Range   Sodium 135 135 - 145 mmol/L   Potassium 3.9 3.5 - 5.1 mmol/L   Chloride 101 98 - 111 mmol/L   CO2 21 (L) 22 - 32 mmol/L   Glucose, Bld 134 (H) 70 - 99 mg/dL   BUN 11 6 - 20 mg/dL   Creatinine, Ser 9.52 0.44 - 1.00 mg/dL   Calcium 9.3 8.9 - 84.1 mg/dL   Total Protein 7.7 6.5 - 8.1 g/dL   Albumin 4.3 3.5 - 5.0 g/dL   AST 22 15 - 41 U/L   ALT 18 0 - 44 U/L   Alkaline Phosphatase 49 38 - 126 U/L   Total Bilirubin 1.0 0.3 - 1.2 mg/dL   GFR, Estimated >32 >44 mL/min   Anion gap 13 5 - 15   Meds ordered this encounter  Medications   lactated ringers bolus 1,000 mL   ondansetron (ZOFRAN) injection 4 mg   cefadroxil (DURICEF) 500 MG capsule    Sig: Take 1 capsule (500 mg total) by mouth 2 (two) times daily.    Dispense:  14 capsule    Refill:  0    Order Specific Question:   Supervising Provider    Answer:   Despina Hidden, LUTHER H [[id number]]   ondansetron (ZOFRAN-ODT) 4 MG disintegrating tablet    Sig: Take 1 tablet (4 mg total) by mouth every 6 (six) hours as needed for nausea.    Dispense:  20 tablet    Refill:  0    Order Specific Question:   Supervising Provider    Answer:   Despina Hidden, LUTHER H [[id number]]   famotidine (PEPCID) 10 MG tablet    Sig: Take 1 tablet (10 mg total) by mouth 2 (two) times daily.    Dispense:  60 tablet    Refill:  0    Order Specific Question:   Supervising Provider    Answer:   Lazaro Arms [[id number]]   Assessment and Plan  --32 y.o. G4P3003 at [redacted]w[redacted]d  --Vomiting with mild ketonuria --S/p IV rehydration --No episodes of vomiting during encounter --PO challenge not attempted due to ongoing nausea --Initiate Duricef for abnormal UA, urine culture sent --Elevated BP readings without diagnosis of HTN --Discharge home in stable condition  Calvert Cantor, MSA, MSN,  CNM 12/29/2022, 12:30 AM

## 2022-12-28 NOTE — MAU Note (Signed)
Pt says she did HPT- 12-08-22- positive. Says has been vomiting 0100-  Last vomiting - in lobby  Also says lower back hurts.  No fever at home

## 2022-12-28 NOTE — Discharge Instructions (Signed)
Vomiting in First Trimester Follow these instructions at home: To help relieve your symptoms, listen to your body. Everyone is different and has different preferences. Find what works best for you. Here are some things you can try to help relieve your symptoms: Meals and snacks Eat 5-6 small meals daily instead of 3 large meals. Eating small meals and snacks can help you avoid an empty stomach. Before getting out of bed, eat a couple of crackers to avoid moving around on an empty stomach. Eat a protein-rich snack before bed. Examples include cheese and crackers, or a peanut butter sandwich made with 1 slice of whole-wheat bread and 1 tsp (5 g) of peanut butter. Eat and drink slowly. Try eating starchy foods as these are usually tolerated well. Examples include cereal, toast, bread, potatoes, pasta, rice, and pretzels. Eat at least one serving of protein with your meals and snacks. Protein options include lean meats, poultry, seafood, beans, nuts, nut butters, eggs, cheese, and yogurt. Eat or suck on things that have ginger in them. It may help to relieve nausea. Add  tsp (0.44 g) ground ginger to hot tea, or choose ginger tea.   Fluids It is important to stay hydrated. Try to: Drink small amounts of fluids often. Drink fluids 30 minutes before or after a meal to help lessen the feeling of a full stomach. Drink 100% fruit juice or an electrolyte drink. An electrolyte drink contains sodium, potassium, and chloride. Drink fluids that are cold, clear, and carbonated or sour. These include lemonade, ginger ale, lemon-lime soda, ice water, and sparkling water. Things to avoid Avoid the following: Eating foods that trigger your symptoms. These may include spicy foods, coffee, high-fat foods, very sweet foods, and acidic foods. Drinking more than 1 cup of fluid at a time. Skipping meals. Nausea can be more intense on an empty stomach. If you cannot tolerate food, do not force it. Try sucking on ice  chips or other frozen items and make up for missed calories later. Lying down within 2 hours after eating. Being exposed to environmental triggers. These may include food smells, smoky rooms, closed spaces, rooms with strong smells, warm or humid places, overly loud and noisy rooms, and rooms with motion or flickering lights. Try eating meals in a well-ventilated area that is free of strong smells. Making quick and sudden changes in your movement. Taking iron pills and multivitamins that contain iron. If you take prescription iron pills, do not stop taking them unless your health care provider approves. Preparing food. The smell of food can spoil your appetite or trigger nausea. General instructions Brush your teeth or use a mouth rinse after meals. Take over-the-counter and prescription medicines only as told by your health care provider. Follow instructions from your health care provider about eating or drinking restrictions. Talk with your health care provider about starting a supplement of vitamin B6. Continue to take your prenatal vitamins as told by your health care provider. If you are having trouble taking your prenatal vitamins, talk with your health care provider about other options. Keep all follow-up visits. This is important. Follow-up visits include prenatal visits. Contact a health care provider if: You have pain in your abdomen. You have a severe headache. You have vision problems. You are losing weight. You feel weak or dizzy. You cannot eat or drink without vomiting, especially if this goes on for a full day. Get help right away if: You cannot drink fluids without vomiting. You vomit blood. You have constant   nausea and vomiting. You are very weak. You faint. You have a fever and your symptoms suddenly get worse. Summary Making some changes to your eating habits may help relieve nausea and vomiting. This condition may be managed with lifestyle changes and medicines as  prescribed by your health care provider. If medicines do not help relieve nausea and vomiting, you may need to receive fluids through an IV at the hospital. This information is not intended to replace advice given to you by your health care provider. Make sure you discuss any questions you have with your health care provider. Document Revised: 03/25/2020 Document Reviewed: 03/25/2020 Elsevier Patient Education  2021 [employer] Inc[employer]  

## 2022-12-29 DIAGNOSIS — R030 Elevated blood-pressure reading, without diagnosis of hypertension: Secondary | ICD-10-CM | POA: Insufficient documentation

## 2022-12-29 LAB — CULTURE, OB URINE

## 2022-12-30 ENCOUNTER — Inpatient Hospital Stay (HOSPITAL_COMMUNITY)
Admission: AD | Admit: 2022-12-30 | Discharge: 2022-12-30 | Disposition: A | Discharge status: Home / Self Care | Payer: Medicaid Other | Attending: Obstetrics and Gynecology | Admitting: Obstetrics and Gynecology

## 2022-12-30 DIAGNOSIS — O99111 Other diseases of the blood and blood-forming organs and certain disorders involving the immune mechanism complicating pregnancy, first trimester: Secondary | ICD-10-CM | POA: Diagnosis present

## 2022-12-30 DIAGNOSIS — R109 Unspecified abdominal pain: Secondary | ICD-10-CM

## 2022-12-30 DIAGNOSIS — R030 Elevated blood-pressure reading, without diagnosis of hypertension: Secondary | ICD-10-CM | POA: Insufficient documentation

## 2022-12-30 DIAGNOSIS — O99331 Smoking (tobacco) complicating pregnancy, first trimester: Secondary | ICD-10-CM | POA: Insufficient documentation

## 2022-12-30 DIAGNOSIS — O219 Vomiting of pregnancy, unspecified: Secondary | ICD-10-CM | POA: Insufficient documentation

## 2022-12-30 DIAGNOSIS — O26899 Other specified pregnancy related conditions, unspecified trimester: Secondary | ICD-10-CM

## 2022-12-30 DIAGNOSIS — O10919 Unspecified pre-existing hypertension complicating pregnancy, unspecified trimester: Secondary | ICD-10-CM

## 2022-12-30 DIAGNOSIS — O26891 Other specified pregnancy related conditions, first trimester: Secondary | ICD-10-CM | POA: Insufficient documentation

## 2022-12-30 DIAGNOSIS — Z3A01 Less than 8 weeks gestation of pregnancy: Secondary | ICD-10-CM | POA: Diagnosis not present

## 2022-12-30 DIAGNOSIS — D72829 Elevated white blood cell count, unspecified: Secondary | ICD-10-CM | POA: Diagnosis not present

## 2022-12-30 DIAGNOSIS — O10911 Unspecified pre-existing hypertension complicating pregnancy, first trimester: Secondary | ICD-10-CM | POA: Diagnosis not present

## 2022-12-30 LAB — CBC WITH DIFFERENTIAL/PLATELET
Abs Immature Granulocytes: 0.05 10*3/uL (ref 0.00–0.07)
Basophils Absolute: 0 10*3/uL (ref 0.0–0.1)
Basophils Relative: 0 %
Eosinophils Absolute: 0 10*3/uL (ref 0.0–0.5)
Eosinophils Relative: 0 %
HCT: 38.4 % (ref 36.0–46.0)
Hemoglobin: 12.8 g/dL (ref 12.0–15.0)
Immature Granulocytes: 0 %
Lymphocytes Relative: 12 %
Lymphs Abs: 1.5 10*3/uL (ref 0.7–4.0)
MCH: 29.8 pg (ref 26.0–34.0)
MCHC: 33.3 g/dL (ref 30.0–36.0)
MCV: 89.3 fL (ref 80.0–100.0)
Monocytes Absolute: 0.4 10*3/uL (ref 0.1–1.0)
Monocytes Relative: 3 %
Neutro Abs: 10.9 10*3/uL — ABNORMAL HIGH (ref 1.7–7.7)
Neutrophils Relative %: 85 %
Platelets: 350 10*3/uL (ref 150–400)
RBC: 4.3 MIL/uL (ref 3.87–5.11)
RDW: 13.3 % (ref 11.5–15.5)
WBC: 12.8 10*3/uL — ABNORMAL HIGH (ref 4.0–10.5)
nRBC: 0 % (ref 0.0–0.2)

## 2022-12-30 LAB — COMPREHENSIVE METABOLIC PANEL
ALT: 19 U/L (ref 0–44)
AST: 22 U/L (ref 15–41)
Albumin: 4.1 g/dL (ref 3.5–5.0)
Alkaline Phosphatase: 47 U/L (ref 38–126)
Anion gap: 12 (ref 5–15)
BUN: 12 mg/dL (ref 6–20)
CO2: 19 mmol/L — ABNORMAL LOW (ref 22–32)
Calcium: 8.9 mg/dL (ref 8.9–10.3)
Chloride: 104 mmol/L (ref 98–111)
Creatinine, Ser: 0.69 mg/dL (ref 0.44–1.00)
GFR, Estimated: >60 mL/min (ref >60)
Glucose, Bld: 128 mg/dL — ABNORMAL HIGH (ref 70–99)
Potassium: 3.5 mmol/L (ref 3.5–5.1)
Sodium: 135 mmol/L (ref 135–145)
Total Bilirubin: 0.5 mg/dL (ref 0.3–1.2)
Total Protein: 7.2 g/dL (ref 6.5–8.1)

## 2022-12-30 LAB — URINALYSIS, ROUTINE W REFLEX MICROSCOPIC
Bilirubin Urine: NEGATIVE
Glucose, UA: NEGATIVE mg/dL
Hgb urine dipstick: NEGATIVE
Ketones, ur: 5 mg/dL — AB
Leukocytes,Ua: NEGATIVE
Nitrite: NEGATIVE
Protein, ur: NEGATIVE mg/dL
Specific Gravity, Urine: 1.017 (ref 1.005–1.030)
pH: 8 (ref 5.0–8.0)

## 2022-12-30 MED ORDER — LACTATED RINGERS IV BOLUS
1000.0000 mL | Freq: Once | INTRAVENOUS | Status: AC
  Administered 2022-12-30: 1000 mL via INTRAVENOUS

## 2022-12-30 MED ORDER — ONDANSETRON HCL 4 MG/2ML IJ SOLN
4.0000 mg | Freq: Once | INTRAMUSCULAR | Status: AC
  Administered 2022-12-30: 4 mg via INTRAVENOUS
  Filled 2022-12-30: qty 2

## 2022-12-30 MED ORDER — ONDANSETRON 4 MG PO TBDP: 4.0000 mg | ORAL_TABLET | Freq: Four times a day (QID) | ORAL | 5 refills | Status: DC | PRN

## 2022-12-30 MED ORDER — PROMETHAZINE HCL 25 MG/ML IJ SOLN
25.0000 mg | Freq: Once | INTRAVENOUS | Status: AC
  Administered 2022-12-30: 25 mg via INTRAVENOUS
  Filled 2022-12-30: qty 1

## 2022-12-30 MED ORDER — PANTOPRAZOLE SODIUM 40 MG PO TBEC: 40.0000 mg | DELAYED_RELEASE_TABLET | Freq: Every day | ORAL | 5 refills | Status: DC

## 2022-12-30 MED ORDER — PANTOPRAZOLE SODIUM 40 MG IV SOLR
40.0000 mg | Freq: Once | INTRAVENOUS | Status: AC
  Administered 2022-12-30: 40 mg via INTRAVENOUS
  Filled 2022-12-30: qty 10

## 2022-12-30 MED ORDER — PROMETHAZINE HCL 25 MG PO TABS: 25.0000 mg | ORAL_TABLET | Freq: Four times a day (QID) | ORAL | 5 refills | Status: DC | PRN

## 2022-12-30 NOTE — Discharge Instructions (Signed)
You were seen in the MAU for nausea and vomiting. You were given IV fluids, nausea medications, and heart burn medications, and your symptoms improved. It is important that you continue to take your zofran that is already prescribed as well as your new prescriptions for phenergan (nausea medicine) and protonix (heartburn/stomach acid medication) on a regular basis, even if you feel well, so that you can avoid feeling like you do today again.  In addition your blood pressures have been elevated over the past few days, which is consistent with a diagnosis of hypertension, which is commonly known as high blood pressure. For now you do not need to take any blood pressure medicine, but you will likely need to do so later in pregnancy. In addition you should take a baby aspirin daily starting at 12 weeks of pregnancy to help reduce your risk of pre-eclampsia later in pregnancy.

## 2022-12-30 NOTE — MAU Note (Addendum)
...  Diana Fuentes is a 32 y.o. at [redacted]w[redacted]d here in MAU reporting: Ongoing nausea and vomiting. She reports prior to 0530 this morning she was feeling better but then she began vomiting again. She reports vomiting more than 10 times since 0530. She reports she is now seeing a small amount of blood in her emesis. She reports she has felt continually light headed since then and has felt like passing out occasionally. Denies VB.  2 lb weight gain since last visit on 4/15.  Last PO meds: Zofran around 0500 Duricef yesterday evening  Onset of complaint: 0530 Pain score: Denies pain.  Lab orders placed from triage: UA

## 2022-12-30 NOTE — MAU Provider Note (Signed)
History     [id number]  Arrival date and time: 12/30/22 9562    Chief Complaint  Patient presents with   Nausea   Emesis   Dizziness     HPI Diana Fuentes is a 32 y.o. at [redacted]w[redacted]d by LMP with PMHx notable for hx of PreE, adrenal adenoma, who presents for nausea and vomiting.   Patient seen two days prior reporting similar symptoms She was given IVF and anti emetics with improvement and d/c to home Today reports that since early this morning has entered another cycle of vomiting Over the past two days has been able to drink plenty of fluids but is not eating very much Is taking the zofran she was prescribed two days prior but does not feel it is helping Endorses some mild epigastric pain Denies any vaginal bleeding or discharge No other infectious symptoms, denies cough, runny nose, headache, burning or pain with urination, diarrhea  Does endorse history of elevated blood pressures in prior pregnancies but none outside of pregnancy   --/--/A POS (10/09 1405)  OB History     Gravida  4   Para  3   Term  3   Preterm      AB      Living  3      SAB      IAB      Ectopic      Multiple  0   Live Births  3           Past Medical History:  Diagnosis Date   Adrenal adenoma    Anxiety    Chlamydia    Depression    Ear infection     Past Surgical History:  Procedure Laterality Date   LAPAROSCOPIC LYSIS OF ADHESIONS N/A 11/02/2018   Procedure: LAPAROSCOPIC LYSIS OF ADHESIONS;  Surgeon: Allie Bossier, MD;  Location: [facility];  Service: Gynecology;  Laterality: N/A;   LAPAROSCOPY N/A 11/02/2018   Procedure: LAPAROSCOPY DIAGNOSTIC;  Surgeon: Allie Bossier, MD;  Location: [facility];  Service: Gynecology;  Laterality: N/A;   WISDOM TOOTH EXTRACTION      Family History  Problem Relation Age of Onset   Hypertension Father    Diabetes Maternal Aunt    Diabetes Maternal Uncle    Diabetes Maternal Grandmother     Social  History   Socioeconomic History   Marital status: Legally Separated    Spouse name: Not on file   Number of children: 1   Years of education: Not on file   Highest education level: High school graduate  Occupational History   Not on file  Tobacco Use   Smoking status: Former   Smokeless tobacco: Current   Tobacco comments:    vape  Building services engineer Use: Former   Substances: Flavoring  Substance and Sexual Activity   Alcohol use: Not Currently    Comment: occ   Drug use: Not Currently    Types: Marijuana   Sexual activity: Yes    Birth control/protection: None  Other Topics Concern   Not on file  Social History Narrative   Not on file   Social Determinants of Health   Financial Resource Strain: Not on file  Food Insecurity: No Food Insecurity (06/22/2022)   Hunger Vital Sign    Worried About Running Out of Food in the Last Year: Never true    Ran Out of Food in the Last Year: Never true  Transportation Needs:  No Transportation Needs (06/22/2022)   PRAPARE - Administrator, Civil Service (Medical): No    Lack of Transportation (Non-Medical): No  Physical Activity: Not on file  Stress: Not on file  Social Connections: Not on file  Intimate Partner Violence: Not At Risk (06/22/2022)   Humiliation, Afraid, Rape, and Kick questionnaire    Fear of Current or Ex-Partner: No    Emotionally Abused: No    Physically Abused: No    Sexually Abused: No    Allergies  Allergen Reactions   Shellfish Allergy Rash and Shortness Of Breath    No current facility-administered medications on file prior to encounter.   Current Outpatient Medications on File Prior to Encounter  Medication Sig Dispense Refill   acetaminophen (TYLENOL) 325 MG tablet Take 2 tablets (650 mg total) by mouth every 4 (four) hours as needed (for pain scale < 4). 30 tablet 0   cefadroxil (DURICEF) 500 MG capsule Take 1 capsule (500 mg total) by mouth 2 (two) times daily. 14 capsule 0   coconut  oil OIL Apply 1 Application topically as needed. 120 mL 0   famotidine (PEPCID) 10 MG tablet Take 1 tablet (10 mg total) by mouth 2 (two) times daily. 60 tablet 0   Prenatal Vit-Fe Fumarate-FA (PRENATAL VITAMINS PO) Take by mouth.     [DISCONTINUED] norgestrel-ethinyl estradiol (LO/OVRAL) 0.3-30 MG-MCG tablet Take 1 tablet by mouth daily. (Patient not taking: Reported on 12/14/2019) 1 Package 11     ROS Pertinent positives and negative per HPI, all others reviewed and negative  Physical Exam   BP 128/71 (BP Location: Left Arm)   Pulse (!) 58   Resp 20   Ht  (1.676 m)   Wt 88.9 kg   LMP 11/08/2022   SpO2 100%   BMI 31.64 kg/m   Patient Vitals for the past 24 hrs:  BP Temp src Pulse Resp SpO2 Height Weight  12/30/22 1113 128/71 -- (!) 58 -- 100 % -- --  12/30/22 0935 -- -- -- -- --  (1.676 m) 88.9 kg  12/30/22 0830 -- -- (!) 45 -- -- -- --  12/30/22 0820 -- -- (!) 42 -- 100 % -- --  12/30/22 0815 -- -- (!) 45 -- 99 % -- --  12/30/22 0810 -- -- -- -- 100 % -- --  12/30/22 0809 (!) 144/71 -- (!) 49 -- -- -- --  12/30/22 0755 (!) 161/97 Oral (!) 51 20 99 % -- --    Physical Exam Vitals reviewed.  Constitutional:      General: She is not in acute distress.    Appearance: She is well-developed. She is not diaphoretic.  Eyes:     General: No scleral icterus. Pulmonary:     Effort: Pulmonary effort is normal. No respiratory distress.  Abdominal:     General: There is no distension.     Palpations: Abdomen is soft.     Tenderness: There is no abdominal tenderness. There is no guarding or rebound.  Skin:    General: Skin is warm and dry.  Neurological:     Mental Status: She is alert.     Coordination: Coordination normal.      Cervical Exam    Bedside Ultrasound Not performed.  My interpretation: n/a  FHT Not done per unit policy  Labs Results for orders placed or performed during the hospital encounter of 12/30/22 (from the past 24 hour(s))   Urinalysis, Routine w reflex microscopic -Urine,  Clean Catch     Status: Abnormal   Collection Time: 12/30/22  8:04 AM  Result Value Ref Range   Color, Urine YELLOW YELLOW   APPearance TURBID (A) CLEAR   Specific Gravity, Urine 1.017 1.005 - 1.030   pH 8.0 5.0 - 8.0   Glucose, UA NEGATIVE NEGATIVE mg/dL   Hgb urine dipstick NEGATIVE NEGATIVE   Bilirubin Urine NEGATIVE NEGATIVE   Ketones, ur 5 (A) NEGATIVE mg/dL   Protein, ur NEGATIVE NEGATIVE mg/dL   Nitrite NEGATIVE NEGATIVE   Leukocytes,Ua NEGATIVE NEGATIVE   RBC / HPF 0-5 0 - 5 RBC/hpf   WBC, UA 0-5 0 - 5 WBC/hpf   Bacteria, UA RARE (A) NONE SEEN   Squamous Epithelial / HPF 0-5 0 - 5 /HPF   Mucus PRESENT   CBC with Differential/Platelet     Status: Abnormal   Collection Time: 12/30/22  8:53 AM  Result Value Ref Range   WBC 12.8 (H) 4.0 - 10.5 K/uL   RBC 4.30 3.87 - 5.11 MIL/uL   Hemoglobin 12.8 12.0 - 15.0 g/dL   HCT 16.1 09.6 - 04.5 %   MCV 89.3 80.0 - 100.0 fL   MCH 29.8 26.0 - 34.0 pg   MCHC 33.3 30.0 - 36.0 g/dL   RDW 40.9 81.1 - 91.4 %   Platelets 350 150 - 400 K/uL   nRBC 0.0 0.0 - 0.2 %   Neutrophils Relative % 85 %   Neutro Abs 10.9 (H) 1.7 - 7.7 K/uL   Lymphocytes Relative 12 %   Lymphs Abs 1.5 0.7 - 4.0 K/uL   Monocytes Relative 3 %   Monocytes Absolute 0.4 0.1 - 1.0 K/uL   Eosinophils Relative 0 %   Eosinophils Absolute 0.0 0.0 - 0.5 K/uL   Basophils Relative 0 %   Basophils Absolute 0.0 0.0 - 0.1 K/uL   Immature Granulocytes 0 %   Abs Immature Granulocytes 0.05 0.00 - 0.07 K/uL  Comprehensive metabolic panel     Status: Abnormal   Collection Time: 12/30/22  8:53 AM  Result Value Ref Range   Sodium 135 135 - 145 mmol/L   Potassium 3.5 3.5 - 5.1 mmol/L   Chloride 104 98 - 111 mmol/L   CO2 19 (L) 22 - 32 mmol/L   Glucose, Bld 128 (H) 70 - 99 mg/dL   BUN 12 6 - 20 mg/dL   Creatinine, Ser 7.82 0.44 - 1.00 mg/dL   Calcium 8.9 8.9 - 95.6 mg/dL   Total Protein 7.2 6.5 - 8.1 g/dL   Albumin 4.1 3.5 - 5.0  g/dL   AST 22 15 - 41 U/L   ALT 19 0 - 44 U/L   Alkaline Phosphatase 47 38 - 126 U/L   Total Bilirubin 0.5 0.3 - 1.2 mg/dL   GFR, Estimated >21 >30 mL/min   Anion gap 12 5 - 15    Imaging No results found.  MAU Course  Procedures Lab Orders         Urinalysis, Routine w reflex microscopic -Urine, Clean Catch         CBC with Differential/Platelet         Comprehensive metabolic panel    Meds ordered this encounter  Medications   promethazine (PHENERGAN) 25 mg in lactated ringers 1,000 mL infusion   ondansetron (ZOFRAN) injection 4 mg   pantoprazole (PROTONIX) injection 40 mg   lactated ringers bolus 1,000 mL   ondansetron (ZOFRAN-ODT) 4 MG disintegrating tablet    Sig: Take 1  tablet (4 mg total) by mouth every 6 (six) hours as needed for nausea.    Dispense:  30 tablet    Refill:  5   pantoprazole (PROTONIX) 40 MG tablet    Sig: Take 1 tablet (40 mg total) by mouth daily.    Dispense:  30 tablet    Refill:  5   promethazine (PHENERGAN) 25 MG tablet    Sig: Take 1 tablet (25 mg total) by mouth every 6 (six) hours as needed for nausea or vomiting.    Dispense:  30 tablet    Refill:  5   Imaging Orders  No imaging studies ordered today    MDM Moderate (Level 3-4)  Assessment and Plan  #Nausea and vomiting of pregnancy #Abdominal pain in pregnancy, first trimester #[redacted] weeks gestation of pregnancy Symptoms much improved s/p IVF and anti-emetics/PPI. Patient able to drink liquids without issue. Strongly encouraged to do PO challenge with some saltines but patient declined. CMP unremarkable, and CBC shows earlier leukocytosis from last visit has mostly resolved. Rx sent for home anti-emetics and PPI, encouraged to continue taking these on a regular schedule.   #Chronic hypertension Multiple elevated BP's over past two days in MAU c/w diagnosis of cHTN. Discharge BP normotensive so will hold on treatment for now, but discussed with patient she will likely need meds in this  pregnancy and also advised her she will need to start a baby aspirin once daily once she is 12 weeks.   Dispo: discharged to home in stable condition    Venora Maples, MD/MPH 12/30/22 11:21 AM  Allergies as of 12/30/2022       Reactions   Shellfish Allergy Rash, Shortness Of Breath        Medication List     STOP taking these medications    cefadroxil 500 MG capsule Commonly known as: DURICEF   famotidine 10 MG tablet Commonly known as: PEPCID       TAKE these medications    acetaminophen 325 MG tablet Commonly known as: Tylenol Take 2 tablets (650 mg total) by mouth every 4 (four) hours as needed (for pain scale < 4).   coconut oil Oil Apply 1 Application topically as needed.   ondansetron 4 MG disintegrating tablet Commonly known as: ZOFRAN-ODT Take 1 tablet (4 mg total) by mouth every 6 (six) hours as needed for nausea.   pantoprazole 40 MG tablet Commonly known as: Protonix Take 1 tablet (40 mg total) by mouth daily.   PRENATAL VITAMINS PO Take by mouth.   promethazine 25 MG tablet Commonly known as: PHENERGAN Take 1 tablet (25 mg total) by mouth every 6 (six) hours as needed for nausea or vomiting.

## 2023-01-19 ENCOUNTER — Other Ambulatory Visit (HOSPITAL_COMMUNITY): Payer: Self-pay

## 2023-01-30 ENCOUNTER — Encounter (HOSPITAL_COMMUNITY): Payer: Self-pay | Admitting: Obstetrics and Gynecology

## 2023-01-30 ENCOUNTER — Inpatient Hospital Stay (HOSPITAL_COMMUNITY)
Admission: AD | Admit: 2023-01-30 | Discharge: 2023-01-30 | Disposition: A | Discharge status: Home / Self Care | Payer: Medicaid Other | Attending: Obstetrics and Gynecology | Admitting: Obstetrics and Gynecology

## 2023-01-30 DIAGNOSIS — Z79899 Other long term (current) drug therapy: Secondary | ICD-10-CM | POA: Diagnosis not present

## 2023-01-30 DIAGNOSIS — Z3A11 11 weeks gestation of pregnancy: Secondary | ICD-10-CM | POA: Diagnosis not present

## 2023-01-30 DIAGNOSIS — O10919 Unspecified pre-existing hypertension complicating pregnancy, unspecified trimester: Secondary | ICD-10-CM | POA: Insufficient documentation

## 2023-01-30 DIAGNOSIS — O219 Vomiting of pregnancy, unspecified: Secondary | ICD-10-CM | POA: Diagnosis present

## 2023-01-30 LAB — URINALYSIS, ROUTINE W REFLEX MICROSCOPIC
Bilirubin Urine: NEGATIVE
Glucose, UA: NEGATIVE mg/dL
Hgb urine dipstick: NEGATIVE
Ketones, ur: 20 mg/dL — AB
Nitrite: NEGATIVE
Protein, ur: 30 mg/dL — AB
Specific Gravity, Urine: 1.026 (ref 1.005–1.030)
pH: 5 (ref 5.0–8.0)

## 2023-01-30 MED ORDER — LACTATED RINGERS IV BOLUS
1000.0000 mL | Freq: Once | INTRAVENOUS | Status: AC
  Administered 2023-01-30: 1000 mL via INTRAVENOUS

## 2023-01-30 MED ORDER — BONJESTA 20-20 MG PO TBCR: 1.0000 | EXTENDED_RELEASE_TABLET | Freq: Every evening | ORAL | 2 refills | Status: DC

## 2023-01-30 MED ORDER — PANTOPRAZOLE SODIUM 40 MG IV SOLR
40.0000 mg | Freq: Once | INTRAVENOUS | Status: AC
  Administered 2023-01-30: 40 mg via INTRAVENOUS
  Filled 2023-01-30: qty 10

## 2023-01-30 MED ORDER — SODIUM CHLORIDE 0.9 % IV SOLN
8.0000 mg | Freq: Once | INTRAVENOUS | Status: AC
  Administered 2023-01-30: 8 mg via INTRAVENOUS
  Filled 2023-01-30: qty 4

## 2023-01-30 NOTE — MAU Provider Note (Signed)
History     CSN: [id number]  Arrival date and time: 01/30/23 0101   None     Chief Complaint  Patient presents with   Emesis   HPI Diana Fuentes is a 32 y.o. G4P3003 at [redacted]w[redacted]d who receives care at [facility].  She presents today for N/V.  She states she was seen last month for similar symptoms, but improved and discontinued her medications.  She states Thursday she started to have nausea and was unable to keep anything down.  She reports she has not been able to take medications for fear of vomiting.  Patient denies vaginal concerns and reports pain in the epigastric area.    OB History     Gravida  4   Para  3   Term  3   Preterm      AB      Living  3      SAB      IAB      Ectopic      Multiple  0   Live Births  3           Past Medical History:  Diagnosis Date   Adrenal adenoma    Anxiety    Chlamydia    Depression    Ear infection     Past Surgical History:  Procedure Laterality Date   LAPAROSCOPIC LYSIS OF ADHESIONS N/A 11/02/2018   Procedure: LAPAROSCOPIC LYSIS OF ADHESIONS;  Surgeon: Allie Bossier, MD;  Location: [facility];  Service: Gynecology;  Laterality: N/A;   LAPAROSCOPY N/A 11/02/2018   Procedure: LAPAROSCOPY DIAGNOSTIC;  Surgeon: Allie Bossier, MD;  Location: [facility];  Service: Gynecology;  Laterality: N/A;   WISDOM TOOTH EXTRACTION      Family History  Problem Relation Age of Onset   Hypertension Father    Diabetes Maternal Aunt    Diabetes Maternal Uncle    Diabetes Maternal Grandmother     Social History   Tobacco Use   Smoking status: Former   Smokeless tobacco: Current   Tobacco comments:    vape  Building services engineer Use: Former   Substances: Flavoring  Substance Use Topics   Alcohol use: Not Currently    Comment: occ   Drug use: Not Currently    Types: Marijuana    Allergies:  Allergies  Allergen Reactions   Shellfish Allergy Rash and Shortness Of Breath    Medications  Prior to Admission  Medication Sig Dispense Refill Last Dose   acetaminophen (TYLENOL) 325 MG tablet Take 2 tablets (650 mg total) by mouth every 4 (four) hours as needed (for pain scale < 4). 30 tablet 0    coconut oil OIL Apply 1 Application topically as needed. 120 mL 0    ondansetron (ZOFRAN-ODT) 4 MG disintegrating tablet Take 1 tablet (4 mg total) by mouth every 6 (six) hours as needed for nausea. 30 tablet 5    pantoprazole (PROTONIX) 40 MG tablet Take 1 tablet (40 mg total) by mouth daily. 30 tablet 5    Prenatal Vit-Fe Fumarate-FA (PRENATAL VITAMINS PO) Take by mouth.      promethazine (PHENERGAN) 25 MG tablet Take 1 tablet (25 mg total) by mouth every 6 (six) hours as needed for nausea or vomiting. 30 tablet 5     Review of Systems  Constitutional:  Negative for chills and fever.  Gastrointestinal:  Positive for abdominal pain (Epigastric), nausea and vomiting.  Genitourinary:  Negative for difficulty urinating,  dysuria, vaginal bleeding and vaginal discharge.  Neurological:  Negative for headaches.   Physical Exam   Blood pressure (!) 169/88, pulse 62, temperature 97.9 F (36.6 C), temperature source Oral, resp. rate 16, height 5\' 6"  (1.676 m), weight 85.5 kg, last menstrual period 11/08/2022, unknown if currently breastfeeding.  Physical Exam Vitals reviewed.  Constitutional:      Appearance: Normal appearance. She is ill-appearing.  HENT:     Head: Normocephalic and atraumatic.  Eyes:     Conjunctiva/sclera: Conjunctivae normal.  Cardiovascular:     Rate and Rhythm: Normal rate.  Pulmonary:     Effort: Pulmonary effort is normal. No respiratory distress.  Musculoskeletal:        General: Normal range of motion.     Cervical back: Normal range of motion.  Neurological:     Mental Status: She is alert and oriented to person, place, and time.  Psychiatric:        Mood and Affect: Mood normal.        Behavior: Behavior normal.     MAU Course  Procedures No  results found for this or any previous visit (from the past 24 hour(s)).  Start IV LR Bolus Antiemetic PPI Prescription Assessment and Plan  32 year old G4P3003 at 11.6 weeks N/V  -Reviewed POC with patient. -Start IV with LR Bolus.  -Zofran for nausea -Protonix IV -Monitor and reassess  Cherre Robins 01/30/2023, 2:44 AM   Reassessment (4:38 AM) -Patient reports improvement in symptoms. Taking sips of soda without issues.  -Discussed sending prescriptions to pharmacy. -Will also send script for Bonjesta/Diclegis to use if noting increased administration of prn medications. -Reviewed blood pressures and diagnosis of CHTN.   -Precautions given.  -Keep next appt as scheduled and return as needed. -Encouraged to call primary office or return to MAU if symptoms worsen or with the onset of new symptoms. -Discharged to home in stable condition.  Cherre Robins MSN, CNM Advanced Practice Provider, [facility] [employer]

## 2023-01-30 NOTE — MAU Note (Signed)
Pt says she was here in April for vomiting - Protonix- stopped taking- last mth - last time . Has not been taking meds bc has felt better- then on Thursday started vomiting again .  Has upper abd pain - 8/10.

## 2023-02-16 ENCOUNTER — Encounter: Payer: Self-pay | Admitting: Medical

## 2023-02-16 ENCOUNTER — Other Ambulatory Visit (HOSPITAL_COMMUNITY)
Admission: RE | Admit: 2023-02-16 | Discharge: 2023-02-16 | Disposition: A | Discharge status: Home / Self Care | Payer: Medicaid Other | Source: Ambulatory Visit | Attending: Medical | Admitting: Medical

## 2023-02-16 ENCOUNTER — Ambulatory Visit (INDEPENDENT_AMBULATORY_CARE_PROVIDER_SITE_OTHER): Payer: Medicaid Other | Admitting: Medical

## 2023-02-16 VITALS — BP 109/61 | HR 77 | Wt 192.0 lb

## 2023-02-16 DIAGNOSIS — N898 Other specified noninflammatory disorders of vagina: Secondary | ICD-10-CM | POA: Insufficient documentation

## 2023-02-16 DIAGNOSIS — Z6830 Body mass index (BMI) 30.0-30.9, adult: Secondary | ICD-10-CM | POA: Diagnosis not present

## 2023-02-16 DIAGNOSIS — O09292 Supervision of pregnancy with other poor reproductive or obstetric history, second trimester: Secondary | ICD-10-CM

## 2023-02-16 DIAGNOSIS — O10919 Unspecified pre-existing hypertension complicating pregnancy, unspecified trimester: Secondary | ICD-10-CM

## 2023-02-16 DIAGNOSIS — O99212 Obesity complicating pregnancy, second trimester: Secondary | ICD-10-CM | POA: Diagnosis not present

## 2023-02-16 DIAGNOSIS — O09299 Supervision of pregnancy with other poor reproductive or obstetric history, unspecified trimester: Secondary | ICD-10-CM | POA: Diagnosis not present

## 2023-02-16 DIAGNOSIS — Z1339 Encounter for screening examination for other mental health and behavioral disorders: Secondary | ICD-10-CM

## 2023-02-16 DIAGNOSIS — O26892 Other specified pregnancy related conditions, second trimester: Secondary | ICD-10-CM

## 2023-02-16 DIAGNOSIS — O10912 Unspecified pre-existing hypertension complicating pregnancy, second trimester: Secondary | ICD-10-CM

## 2023-02-16 DIAGNOSIS — F3289 Other specified depressive episodes: Secondary | ICD-10-CM

## 2023-02-16 DIAGNOSIS — O09899 Supervision of other high risk pregnancies, unspecified trimester: Secondary | ICD-10-CM | POA: Diagnosis not present

## 2023-02-16 DIAGNOSIS — O0992 Supervision of high risk pregnancy, unspecified, second trimester: Secondary | ICD-10-CM | POA: Diagnosis not present

## 2023-02-16 DIAGNOSIS — O10012 Pre-existing essential hypertension complicating pregnancy, second trimester: Secondary | ICD-10-CM | POA: Diagnosis not present

## 2023-02-16 DIAGNOSIS — Z3A14 14 weeks gestation of pregnancy: Secondary | ICD-10-CM

## 2023-02-16 DIAGNOSIS — E669 Obesity, unspecified: Secondary | ICD-10-CM | POA: Diagnosis not present

## 2023-02-16 DIAGNOSIS — O09892 Supervision of other high risk pregnancies, second trimester: Secondary | ICD-10-CM

## 2023-02-16 DIAGNOSIS — K5901 Slow transit constipation: Secondary | ICD-10-CM

## 2023-02-16 DIAGNOSIS — Z124 Encounter for screening for malignant neoplasm of cervix: Secondary | ICD-10-CM | POA: Insufficient documentation

## 2023-02-16 MED ORDER — TERCONAZOLE 0.4 % VA CREA
1.0000 | TOPICAL_CREAM | Freq: Every day | VAGINAL | 0 refills | Status: DC
Start: 2023-02-16 — End: 2023-03-25

## 2023-02-16 MED ORDER — ASPIRIN 81 MG PO TBEC
81.0000 mg | DELAYED_RELEASE_TABLET | Freq: Every day | ORAL | 12 refills | Status: DC
Start: 2023-02-16 — End: 2023-07-30

## 2023-02-16 MED ORDER — POLYETHYLENE GLYCOL 3350 17 GM/SCOOP PO POWD
1.0000 | Freq: Once | ORAL | 0 refills | Status: AC
Start: 2023-02-16 — End: 2023-02-16

## 2023-02-16 NOTE — Patient Instructions (Signed)

## 2023-02-16 NOTE — Progress Notes (Signed)
PRENATAL VISIT NOTE  Subjective:  Diana Fuentes is a 32 y.o. G4P3003 at [redacted]w[redacted]d being seen today for her first prenatal visit for this pregnancy.  She is currently monitored for the following issues for this high-risk pregnancy and has Adrenal adenoma, left; Depression; Hx of preeclampsia, prior pregnancy, currently pregnant; Chronic hypertension during pregnancy, antepartum; and Supervision of high risk pregnancy, antepartum, second trimester on their problem list.  Patient reports no complaints.  Contractions: Not present. Vag. Bleeding: None.  Movement: Absent. Denies leaking of fluid.   She is planning to breastfeed. Desires possible BTL for contraception.   The following portions of the patient's history were reviewed and updated as appropriate: allergies, current medications, past family history, past medical history, past social history, past surgical history and problem list.   Objective:   Vitals:   02/16/23 0915  BP: 109/61  Pulse: 77  Weight: 192 lb (87.1 kg)    Fetal Status: Fetal Heart Rate (bpm): 153   Movement: Absent     General:  Alert, oriented and cooperative. Patient is in no acute distress.  Skin: Skin is warm and dry. No rash noted.   Cardiovascular: Normal heart rate and rhythm noted  Respiratory: Normal respiratory effort, no problems with respiration noted. Clear to auscultation.   Abdomen: Soft, gravid, appropriate for gestational age. Normal bowel sounds. Non-tender. Pain/Pressure: Absent     Pelvic: Cervical exam performed       closed, thick  Normal cervical contour, no lesions, no bleeding following pap, normal discharge  Extremities: Normal range of motion.  Edema: None  Mental Status: Normal mood and affect. Normal behavior. Normal judgment and thought content.    Indications for ASA therapy (per uptodate) One of the following: Previous pregnancy with preeclampsia, especially early onset and with an adverse outcome Yes Multifetal gestation  No Chronic hypertension Yes Type 1 or 2 diabetes mellitus No Chronic kidney disease No Autoimmune disease (antiphospholipid syndrome, systemic lupus erythematosus) No  Indications for early GDM screening  First-degree relative with diabetes No BMI >30kg/m2 Yes Age > 35 No Previous birth of an infant weighing ?4000 g No Gestational diabetes mellitus in a previous pregnancy No Glycated hemoglobin ?5.7 percent (39 mmol/mol), impaired glucose tolerance, or impaired fasting glucose on previous testing No High-risk race/ethnicity (eg, African American, Latino, Native American, [place] American, Pacific Islander) Yes Previous stillbirth of unknown cause No Maternal birthweight > 9 lbs No History of cardiovascular disease No Hypertension or on therapy for hypertension Yes High-density lipoprotein cholesterol level <35 mg/dL (9.60 mmol/L) and/or a triglyceride level >250 mg/dL (4.54 mmol/L) No Polycystic ovary syndrome No Physical inactivity No Other clinical condition associated with insulin resistance (eg, severe obesity, acanthosis nigricans) No Current use of glucocorticoids No   Assessment and Plan:  Pregnancy: G4P3003 at [redacted]w[redacted]d 1. Supervision of high risk pregnancy, antepartum, second trimester - CBC/D/Plt+RPR+Rh+ABO+RubIgG... - Culture, OB Urine - [place] MFM OB DETAIL +14 WK; Future - PANORAMA PRENATAL TEST - CHL AMB BABYSCRIPTS OPT IN - HORIZON CUSTOM  2. BMI 30.0-30.9,adult - Hemoglobin A1c  3. Short interval between pregnancies affecting pregnancy, antepartum - Last delivery 06/2022  4. Hx of preeclampsia, prior pregnancy, currently pregnant - aspirin EC 81 MG tablet; Take 1 tablet (81 mg total) by mouth daily. Swallow whole.  Dispense: 30 tablet; Refill: 12  5. Chronic hypertension during pregnancy, antepartum - Hemoglobin A1c - Comprehensive metabolic panel - Protein / creatinine ratio, urine - aspirin EC 81 MG tablet; Take 1 tablet (81 mg total) by  mouth daily. Swallow  whole.  Dispense: 30 tablet; Refill: 12  6. Cervical cancer screening - Cytology - PAP( [place])  7. Vaginal discharge during pregnancy in second trimester - Cervicovaginal ancillary only( [place])  8. [redacted] weeks gestation of pregnancy - HORIZON CUSTOM  9. Other depression - patient states remote history of depression, currently no symptoms and no medications   10. Constipation  - Rx Miralax   11. Vaginal yeast  - clinical impression of yeast today  - Rx terazol sent   Preterm labor/first trimester warning symptoms and general obstetric precautions including but not limited to vaginal bleeding, contractions, leaking of fluid and fetal movement were reviewed in detail with the patient. Please refer to After Visit Summary for other counseling recommendations.   Discussed the normal visit cadence for prenatal care Discussed the nature of our practice with multiple providers including residents and students   Return in about 4 weeks (around 03/16/2023) for [facility] APP, In-Person, any provider.  Future Appointments  Date Time Provider Department Center  03/19/2023 10:35 AM Levie Heritage, DO [facility] None  03/25/2023  9:30 AM [facility] NURSE [facility]  03/25/2023  9:45 AM [facility] US4 [facility]    Vonzella Nipple, PA-C

## 2023-02-17 LAB — CBC/D/PLT+RPR+RH+ABO+RUBIGG...
Antibody Screen: NEGATIVE
Basophils Absolute: 0 10*3/uL (ref 0.0–0.2)
Basos: 0 %
EOS (ABSOLUTE): 0.1 10*3/uL (ref 0.0–0.4)
Eos: 1 %
HCV Ab: NONREACTIVE
HIV Screen 4th Generation wRfx: NONREACTIVE
Hematocrit: 36.7 % (ref 34.0–46.6)
Hemoglobin: 12.1 g/dL (ref 11.1–15.9)
Hepatitis B Surface Ag: NEGATIVE
Immature Grans (Abs): 0 10*3/uL (ref 0.0–0.1)
Immature Granulocytes: 0 %
Lymphocytes Absolute: 1.8 10*3/uL (ref 0.7–3.1)
Lymphs: 21 %
MCH: 30.3 pg (ref 26.6–33.0)
MCHC: 33 g/dL (ref 31.5–35.7)
MCV: 92 fL (ref 79–97)
Monocytes Absolute: 0.4 10*3/uL (ref 0.1–0.9)
Monocytes: 4 %
Neutrophils Absolute: 6.4 10*3/uL (ref 1.4–7.0)
Neutrophils: 74 %
Platelets: 269 10*3/uL (ref 150–450)
RBC: 4 x10E6/uL (ref 3.77–5.28)
RDW: 12.8 % (ref 11.7–15.4)
RPR Ser Ql: NONREACTIVE
Rh Factor: POSITIVE
Rubella Antibodies, IGG: <0.9 index — ABNORMAL LOW (ref >0.99)
WBC: 8.6 10*3/uL (ref 3.4–10.8)

## 2023-02-17 LAB — CERVICOVAGINAL ANCILLARY ONLY
Bacterial Vaginitis (gardnerella): NEGATIVE
Candida Glabrata: NEGATIVE
Candida Vaginitis: POSITIVE — AB
Comment: NEGATIVE
Comment: NEGATIVE
Comment: NEGATIVE

## 2023-02-17 LAB — COMPREHENSIVE METABOLIC PANEL
ALT: 7 IU/L (ref 0–32)
AST: 7 IU/L (ref 0–40)
Albumin/Globulin Ratio: 2 (ref 1.2–2.2)
Albumin: 4 g/dL (ref 4.0–5.0)
Alkaline Phosphatase: 49 IU/L (ref 44–121)
BUN/Creatinine Ratio: 10 (ref 9–23)
BUN: 5 mg/dL — ABNORMAL LOW (ref 6–20)
Bilirubin Total: 0.2 mg/dL (ref 0.0–1.2)
CO2: 22 mmol/L (ref 20–29)
Calcium: 9.3 mg/dL (ref 8.7–10.2)
Chloride: 103 mmol/L (ref 96–106)
Creatinine, Ser: 0.51 mg/dL — ABNORMAL LOW (ref 0.57–1.00)
Globulin, Total: 2 g/dL (ref 1.5–4.5)
Glucose: 90 mg/dL (ref 70–99)
Potassium: 4.9 mmol/L (ref 3.5–5.2)
Sodium: 138 mmol/L (ref 134–144)
Total Protein: 6 g/dL (ref 6.0–8.5)
eGFR: 130 mL/min/{1.73_m2} (ref >59)

## 2023-02-17 LAB — PROTEIN / CREATININE RATIO, URINE
Creatinine, Urine: 206.7 mg/dL
Protein, Ur: 15.3 mg/dL
Protein/Creat Ratio: 74 mg/g creat (ref 0–200)

## 2023-02-17 LAB — HCV INTERPRETATION

## 2023-02-17 LAB — HEMOGLOBIN A1C
Est. average glucose Bld gHb Est-mCnc: 108 mg/dL
Hgb A1c MFr Bld: 5.4 % (ref 4.8–5.6)

## 2023-02-18 LAB — CULTURE, OB URINE

## 2023-02-18 LAB — URINE CULTURE, OB REFLEX

## 2023-02-19 LAB — CYTOLOGY - PAP
Chlamydia: NEGATIVE
Comment: NEGATIVE
Comment: NORMAL
Diagnosis: NEGATIVE
Neisseria Gonorrhea: NEGATIVE

## 2023-02-22 ENCOUNTER — Inpatient Hospital Stay (HOSPITAL_COMMUNITY)
Admission: AD | Admit: 2023-02-22 | Discharge: 2023-02-22 | Disposition: A | Discharge status: Home / Self Care | Payer: Medicaid Other | Attending: Obstetrics and Gynecology | Admitting: Obstetrics and Gynecology

## 2023-02-22 DIAGNOSIS — Z3A15 15 weeks gestation of pregnancy: Secondary | ICD-10-CM | POA: Insufficient documentation

## 2023-02-22 DIAGNOSIS — R12 Heartburn: Secondary | ICD-10-CM | POA: Insufficient documentation

## 2023-02-22 DIAGNOSIS — O210 Mild hyperemesis gravidarum: Secondary | ICD-10-CM | POA: Diagnosis not present

## 2023-02-22 DIAGNOSIS — O219 Vomiting of pregnancy, unspecified: Secondary | ICD-10-CM | POA: Insufficient documentation

## 2023-02-22 DIAGNOSIS — Z79899 Other long term (current) drug therapy: Secondary | ICD-10-CM | POA: Diagnosis not present

## 2023-02-22 DIAGNOSIS — Z7982 Long term (current) use of aspirin: Secondary | ICD-10-CM | POA: Diagnosis not present

## 2023-02-22 DIAGNOSIS — Z87891 Personal history of nicotine dependence: Secondary | ICD-10-CM | POA: Diagnosis not present

## 2023-02-22 DIAGNOSIS — O99332 Smoking (tobacco) complicating pregnancy, second trimester: Secondary | ICD-10-CM | POA: Diagnosis present

## 2023-02-22 LAB — URINALYSIS, ROUTINE W REFLEX MICROSCOPIC
Bilirubin Urine: NEGATIVE
Glucose, UA: NEGATIVE mg/dL
Hgb urine dipstick: NEGATIVE
Ketones, ur: 80 mg/dL — AB
Leukocytes,Ua: NEGATIVE
Nitrite: NEGATIVE
Protein, ur: 30 mg/dL — AB
Specific Gravity, Urine: 1.026 (ref 1.005–1.030)
pH: 5 (ref 5.0–8.0)

## 2023-02-22 MED ORDER — PANTOPRAZOLE SODIUM 40 MG IV SOLR
40.0000 mg | Freq: Once | INTRAVENOUS | Status: AC
  Administered 2023-02-22: 40 mg via INTRAVENOUS
  Filled 2023-02-22: qty 10

## 2023-02-22 MED ORDER — SODIUM CHLORIDE 0.9 % IV SOLN
25.0000 mg | Freq: Once | INTRAVENOUS | Status: AC
  Administered 2023-02-22: 25 mg via INTRAVENOUS
  Filled 2023-02-22: qty 1

## 2023-02-22 MED ORDER — SODIUM CHLORIDE 0.9 % IV SOLN
8.0000 mg | Freq: Once | INTRAVENOUS | Status: AC
  Administered 2023-02-22: 8 mg via INTRAVENOUS
  Filled 2023-02-22: qty 4

## 2023-02-22 MED ORDER — LACTATED RINGERS IV BOLUS
1000.0000 mL | Freq: Once | INTRAVENOUS | Status: AC
  Administered 2023-02-22: 1000 mL via INTRAVENOUS

## 2023-02-22 NOTE — MAU Provider Note (Signed)
History     CSN: [id number]  Arrival date and time: 02/22/23 4098   Event Date/Time   First Provider Initiated Contact with Patient 02/22/23 0542    Diana Fuentes is a 32 y.o. G4P3003 at [redacted]w[redacted]d who receives care at [facility].  She presents today for nausea and vomiting.  She states she has been having N/V, but felt that it was consistent with normal morning sickness.   However, she states in the past 2 days she has been having constant vomiting.  She states she has thrown up ~ 15x yesterday.  She reports she had been taking zofran and took a dose today with no relief.  She also reports she has [place] but did not take it. She also reports epigastric discomfort that she contributes to "throwing up bile."  She states she has taken antacid, but continues to have heartburn like pain. Patient questions if n/v is normal and does endorse having issues prior to pregnancy that was consistent with alcohol usage.   OB History     Gravida  4   Para  3   Term  3   Preterm      AB      Living  3      SAB      IAB      Ectopic      Multiple  0   Live Births  3           Past Medical History:  Diagnosis Date   Adrenal adenoma    Anxiety    Chlamydia    Depression    Ear infection     Past Surgical History:  Procedure Laterality Date   LAPAROSCOPIC LYSIS OF ADHESIONS N/A 11/02/2018   Procedure: LAPAROSCOPIC LYSIS OF ADHESIONS;  Surgeon: Allie Bossier, MD;  Location: [facility];  Service: Gynecology;  Laterality: N/A;   LAPAROSCOPY N/A 11/02/2018   Procedure: LAPAROSCOPY DIAGNOSTIC;  Surgeon: Allie Bossier, MD;  Location: [facility];  Service: Gynecology;  Laterality: N/A;   WISDOM TOOTH EXTRACTION      Family History  Problem Relation Age of Onset   Hypertension Father    Diabetes Maternal Aunt    Diabetes Maternal Uncle    Diabetes Maternal Grandmother     Social History   Tobacco Use   Smoking status: Former   Smokeless tobacco:  Current   Tobacco comments:    vape  Building services engineer Use: Former   Substances: Flavoring  Substance Use Topics   Alcohol use: Not Currently    Comment: occ   Drug use: Not Currently    Types: Marijuana    Allergies:  Allergies  Allergen Reactions   Shellfish Allergy Rash and Shortness Of Breath    Medications Prior to Admission  Medication Sig Dispense Refill Last Dose   ondansetron (ZOFRAN-ODT) 4 MG disintegrating tablet Take 1 tablet (4 mg total) by mouth every 6 (six) hours as needed for nausea. 30 tablet 5 02/21/2023   pantoprazole (PROTONIX) 40 MG tablet Take 1 tablet (40 mg total) by mouth daily. 30 tablet 5 02/21/2023   acetaminophen (TYLENOL) 325 MG tablet Take 2 tablets (650 mg total) by mouth every 4 (four) hours as needed (for pain scale < 4). (Patient not taking: Reported on 02/16/2023) 30 tablet 0    aspirin EC 81 MG tablet Take 1 tablet (81 mg total) by mouth daily. Swallow whole. 30 tablet 12    Doxylamine-Pyridoxine ER (BONJESTA)  20-20 MG TBCR Take 1 tablet by mouth at bedtime. (Patient not taking: Reported on 02/16/2023) 60 tablet 2    Prenatal Vit-Fe Fumarate-FA (PRENATAL VITAMINS PO) Take by mouth. (Patient not taking: Reported on 02/16/2023)      promethazine (PHENERGAN) 25 MG tablet Take 1 tablet (25 mg total) by mouth every 6 (six) hours as needed for nausea or vomiting. (Patient not taking: Reported on 02/16/2023) 30 tablet 5    terconazole (TERAZOL 7) 0.4 % vaginal cream Place 1 applicator vaginally at bedtime. 45 g 0     Review of Systems  Gastrointestinal:  Positive for abdominal pain (Epigastric), constipation, nausea and vomiting.  Genitourinary:  Negative for difficulty urinating, dysuria, vaginal bleeding and vaginal discharge.   Physical Exam   Blood pressure 137/78, pulse 76, temperature 98.2 F (36.8 C), temperature source Oral, resp. rate 16, height 5\' 6"  (1.676 m), weight 83.3 kg, last menstrual period 11/08/2022, SpO2 100 %, unknown if currently  breastfeeding.  Physical Exam Vitals reviewed. Exam conducted with a chaperone present.  Constitutional:      General: She is not in acute distress.    Appearance: Normal appearance. She is not ill-appearing.  HENT:     Head: Normocephalic and atraumatic.  Eyes:     Conjunctiva/sclera: Conjunctivae normal.  Cardiovascular:     Rate and Rhythm: Normal rate.  Pulmonary:     Effort: Pulmonary effort is normal. No respiratory distress.  Musculoskeletal:        General: Normal range of motion.     Cervical back: Normal range of motion.  Neurological:     Mental Status: She is alert.  Psychiatric:        Mood and Affect: Mood normal.        Behavior: Behavior normal.     MAU Course  Procedures Results for orders placed or performed during the hospital encounter of 02/22/23 (from the past 24 hour(s))  Urinalysis, Routine w reflex microscopic -Urine, Clean Catch     Status: Abnormal   Collection Time: 02/22/23  6:41 AM  Result Value Ref Range   Color, Urine YELLOW YELLOW   APPearance HAZY (A) CLEAR   Specific Gravity, Urine 1.026 1.005 - 1.030   pH 5.0 5.0 - 8.0   Glucose, UA NEGATIVE NEGATIVE mg/dL   Hgb urine dipstick NEGATIVE NEGATIVE   Bilirubin Urine NEGATIVE NEGATIVE   Ketones, ur 80 (A) NEGATIVE mg/dL   Protein, ur 30 (A) NEGATIVE mg/dL   Nitrite NEGATIVE NEGATIVE   Leukocytes,Ua NEGATIVE NEGATIVE   RBC / HPF 0-5 0 - 5 RBC/hpf   WBC, UA 0-5 0 - 5 WBC/hpf   Bacteria, UA RARE (A) NONE SEEN   Squamous Epithelial / HPF 0-5 0 - 5 /HPF   Mucus PRESENT    Hyaline Casts, UA PRESENT     MDM Start IV LR Bolus Antiemetic PPI Labs: UA Prescription Assessment and Plan  32 year old, G4P3003  SIUP at 15.1 weeks N/V  -Reviewed POC with patient. -Exam performed and findings discussed.  -Start IV with LR Bolus -Give Zofran and Phenergan IV. -Give Protonix. -POC discussed with patient who is agreeable. -No questions or concerns. -Will observe and reassess for  readiness of PO challenge.   Cherre Robins 02/22/2023, 5:42 AM   Reassessment (7:52 AM)  -Patient resting in bed. -Reports improvement in symptoms.  -Discussed starting Bonjesta at home and doing Zofran and phenergan for prn usage. Patient agreeable with plan.  -Rx for phenergan sent to pharmacy  on file.  -Will plan for patient to receive remainder of IV fluids then discharge. -Encouraged to call primary office or return to MAU if symptoms worsen or with the onset of new symptoms. -Discharged to home in stable condition.  Cherre Robins MSN, CNM Advanced Practice Provider, [facility]

## 2023-02-22 NOTE — MAU Note (Signed)
..  Diana Fuentes is a 32 y.o. at [redacted]w[redacted]d here in MAU reporting: nausea/vomiting for the past two days. Has not been able to eat or sleep. Reports mid upper abdominal pain and throat burning.  Zofran 8pm Antiacid 11pm Denies vaginal bleeding or leaking of fluid or lower abdominal pain.   Pain score: 8/10 burning  Vitals:   02/22/23 0526  BP: 137/78  Pulse: 76  Resp: 16  Temp: 98.2 F (36.8 C)  SpO2: 100%     FHT:153 Lab orders placed from triage:

## 2023-02-23 LAB — PANORAMA PRENATAL TEST FULL PANEL:PANORAMA TEST PLUS 5 ADDITIONAL MICRODELETIONS: FETAL FRACTION: 10.2

## 2023-02-25 LAB — HORIZON CUSTOM: REPORT SUMMARY: NEGATIVE

## 2023-03-19 ENCOUNTER — Ambulatory Visit (INDEPENDENT_AMBULATORY_CARE_PROVIDER_SITE_OTHER): Payer: Medicaid Other | Admitting: Family Medicine

## 2023-03-19 VITALS — BP 119/77 | HR 83 | Wt 188.0 lb

## 2023-03-19 DIAGNOSIS — O09299 Supervision of pregnancy with other poor reproductive or obstetric history, unspecified trimester: Secondary | ICD-10-CM

## 2023-03-19 DIAGNOSIS — O10919 Unspecified pre-existing hypertension complicating pregnancy, unspecified trimester: Secondary | ICD-10-CM

## 2023-03-19 DIAGNOSIS — O09899 Supervision of other high risk pregnancies, unspecified trimester: Secondary | ICD-10-CM

## 2023-03-19 DIAGNOSIS — Z3A18 18 weeks gestation of pregnancy: Secondary | ICD-10-CM

## 2023-03-19 DIAGNOSIS — O0992 Supervision of high risk pregnancy, unspecified, second trimester: Secondary | ICD-10-CM

## 2023-03-19 DIAGNOSIS — O99210 Obesity complicating pregnancy, unspecified trimester: Secondary | ICD-10-CM | POA: Insufficient documentation

## 2023-03-19 NOTE — Progress Notes (Signed)
   PRENATAL VISIT NOTE  Subjective:  Diana Fuentes is a 32 y.o. G4P3003 at [redacted]w[redacted]d being seen today for ongoing prenatal care.  She is currently monitored for the following issues for this low-risk pregnancy and has Adrenal adenoma, left; Depression; Hx of preeclampsia, prior pregnancy, currently pregnant; Chronic hypertension during pregnancy, antepartum; and Supervision of high risk pregnancy, antepartum, second trimester on their problem list.  Patient reports nausea - still having some nausea. Taking zofran with improvement. Not eating a lot of animal protein at this point.  Contractions: Not present. Vag. Bleeding: None.  Movement: Present. Denies leaking of fluid.   The following portions of the patient's history were reviewed and updated as appropriate: allergies, current medications, past family history, past medical history, past social history, past surgical history and problem list.   Objective:   Vitals:   03/19/23 0946  BP: 119/77  Pulse: 83  Weight: 188 lb (85.3 kg)    Fetal Status: Fetal Heart Rate (bpm): 139   Movement: Present     General:  Alert, oriented and cooperative. Patient is in no acute distress.  Skin: Skin is warm and dry. No rash noted.   Cardiovascular: Normal heart rate noted  Respiratory: Normal respiratory effort, no problems with respiration noted  Abdomen: Soft, gravid, appropriate for gestational age.  Pain/Pressure: Absent     Pelvic: Cervical exam deferred        Extremities: Normal range of motion.  Edema: None  Mental Status: Normal mood and affect. Normal behavior. Normal judgment and thought content.   Assessment and Plan:  Pregnancy: G4P3003 at [redacted]w[redacted]d 1. [redacted] weeks gestation of pregnancy  2. Supervision of high risk pregnancy, antepartum, second trimester FHT and FH normal  3. Chronic hypertension during pregnancy, antepartum BP controlled off meds  4. Hx of preeclampsia, prior pregnancy, currently pregnant On ASA 81mg   5. Short  interval between pregnancies affecting pregnancy, antepartum   Preterm labor symptoms and general obstetric precautions including but not limited to vaginal bleeding, contractions, leaking of fluid and fetal movement were reviewed in detail with the patient. Please refer to After Visit Summary for other counseling recommendations.   No follow-ups on file.  Future Appointments  Date Time Provider Department Center  03/19/2023 10:35 AM Levie Heritage, DO [facility] None  03/25/2023  9:30 AM [facility] NURSE [facility]  03/25/2023  9:45 AM [facility] US4 [facility]    Levie Heritage, DO

## 2023-03-25 ENCOUNTER — Encounter: Payer: Self-pay | Admitting: *Deleted

## 2023-03-25 ENCOUNTER — Ambulatory Visit: Payer: Medicaid Other

## 2023-03-25 ENCOUNTER — Ambulatory Visit: Payer: Medicaid Other | Admitting: *Deleted

## 2023-03-25 ENCOUNTER — Other Ambulatory Visit: Payer: Self-pay | Admitting: *Deleted

## 2023-03-25 VITALS — BP 124/70 | HR 73

## 2023-03-25 DIAGNOSIS — O0992 Supervision of high risk pregnancy, unspecified, second trimester: Secondary | ICD-10-CM | POA: Insufficient documentation

## 2023-03-25 DIAGNOSIS — O99212 Obesity complicating pregnancy, second trimester: Secondary | ICD-10-CM | POA: Diagnosis not present

## 2023-03-25 DIAGNOSIS — O10012 Pre-existing essential hypertension complicating pregnancy, second trimester: Secondary | ICD-10-CM

## 2023-03-25 DIAGNOSIS — Z3A19 19 weeks gestation of pregnancy: Secondary | ICD-10-CM

## 2023-03-25 DIAGNOSIS — O09299 Supervision of pregnancy with other poor reproductive or obstetric history, unspecified trimester: Secondary | ICD-10-CM

## 2023-03-25 DIAGNOSIS — O10919 Unspecified pre-existing hypertension complicating pregnancy, unspecified trimester: Secondary | ICD-10-CM | POA: Diagnosis present

## 2023-03-25 DIAGNOSIS — O09899 Supervision of other high risk pregnancies, unspecified trimester: Secondary | ICD-10-CM

## 2023-03-25 DIAGNOSIS — O10912 Unspecified pre-existing hypertension complicating pregnancy, second trimester: Secondary | ICD-10-CM

## 2023-03-25 DIAGNOSIS — E669 Obesity, unspecified: Secondary | ICD-10-CM | POA: Diagnosis not present

## 2023-03-25 DIAGNOSIS — O09292 Supervision of pregnancy with other poor reproductive or obstetric history, second trimester: Secondary | ICD-10-CM

## 2023-04-15 ENCOUNTER — Ambulatory Visit (INDEPENDENT_AMBULATORY_CARE_PROVIDER_SITE_OTHER): Payer: Medicaid Other | Admitting: Obstetrics & Gynecology

## 2023-04-15 VITALS — BP 102/66 | HR 92 | Wt 187.0 lb

## 2023-04-15 DIAGNOSIS — O0992 Supervision of high risk pregnancy, unspecified, second trimester: Secondary | ICD-10-CM

## 2023-04-15 DIAGNOSIS — O219 Vomiting of pregnancy, unspecified: Secondary | ICD-10-CM

## 2023-04-15 DIAGNOSIS — Z3A22 22 weeks gestation of pregnancy: Secondary | ICD-10-CM

## 2023-04-15 DIAGNOSIS — O10919 Unspecified pre-existing hypertension complicating pregnancy, unspecified trimester: Secondary | ICD-10-CM

## 2023-04-15 DIAGNOSIS — O09299 Supervision of pregnancy with other poor reproductive or obstetric history, unspecified trimester: Secondary | ICD-10-CM

## 2023-04-15 MED ORDER — METOCLOPRAMIDE HCL 10 MG PO TABS
10.0000 mg | ORAL_TABLET | Freq: Four times a day (QID) | ORAL | 2 refills | Status: DC | PRN
Start: 2023-04-15 — End: 2023-06-04

## 2023-04-15 MED ORDER — SCOPOLAMINE 1 MG/3DAYS TD PT72
1.0000 | MEDICATED_PATCH | TRANSDERMAL | 12 refills | Status: DC
Start: 2023-04-15 — End: 2023-06-03

## 2023-04-15 NOTE — Progress Notes (Signed)
PRENATAL VISIT NOTE  Subjective:  Diana Fuentes is a 32 y.o. G4P3003 at [redacted]w[redacted]d being seen today for ongoing prenatal care.  She is currently monitored for the following issues for this high-risk pregnancy and has Adrenal adenoma, left; Depression; Hx of preeclampsia, prior pregnancy, currently pregnant; Chronic hypertension during pregnancy, antepartum; Supervision of high risk pregnancy, antepartum, second trimester; Short interval between pregnancies affecting pregnancy, antepartum; and Obesity affecting pregnancy on their problem list.  Patient reports nausea and vomiting.  Wants medication other than Zofran or Phenergan, Contractions: Not present. Vag. Bleeding: None.  Movement: Present. Denies leaking of fluid.   The following portions of the patient's history were reviewed and updated as appropriate: allergies, current medications, past family history, past medical history, past social history, past surgical history and problem list.   Objective:   Vitals:   04/15/23 1431  BP: 102/66  Pulse: 92  Weight: 187 lb (84.8 kg)    Fetal Status: Fetal Heart Rate (bpm): 152   Movement: Present     General:  Alert, oriented and cooperative. Patient is in no acute distress.  Skin: Skin is warm and dry. No rash noted.   Cardiovascular: Normal heart rate noted  Respiratory: Normal respiratory effort, no problems with respiration noted  Abdomen: Soft, gravid, appropriate for gestational age.  Pain/Pressure: Absent     Pelvic: Cervical exam deferred        Extremities: Normal range of motion.  Edema: None  Mental Status: Normal mood and affect. Normal behavior. Normal judgment and thought content.   [place] MFM OB DETAIL +14 WK  Result Date: 03/25/2023 ----------------------------------------------------------------------  OBSTETRICS REPORT                       (Signed Final 03/25/2023 11:12 am) ---------------------------------------------------------------------- Patient Info  ID #:        [id number]                          D.O.B.:  December 01, 1990 (31 yrs)  Name:       Diana Fuentes                 Visit Date: 03/25/2023 09:37 am ---------------------------------------------------------------------- Performed By  Attending:        Noralee Space MD        Ref. Address:     [address]                                                             [place], [place]                                                             [address]  Performed By:     Marcellina Millin       [facility]  Referred By:      Marny Lowenstein                    [place] ---------------------------------------------------------------------- Orders  #  Description                           Code        Ordered By  1  [place] MFM OB DETAIL +14 WK               76811.01    Vonzella Nipple ----------------------------------------------------------------------  #  Order #                     Accession #                Episode #  1  [id number]                   [id number]                 [id number] ---------------------------------------------------------------------- Indications  Hypertension - Chronic/Pre-existing (no        O10.019  meds)  Obesity complicating pregnancy, second         O99.212  trimester (BMI 30)  Short interval between pregancies, 2nd         O09.892  trimester (delivered 06/22/22)  Poor obstetric history: Previous preeclampsia  O09.299  Encounter for antenatal screening for          Z36.3  malformations  [redacted] weeks gestation of pregnancy                Z3A.19  LR NIPS - Female, Negative Horizon ---------------------------------------------------------------------- Vital Signs  BP:          124/70 ---------------------------------------------------------------------- Fetal Evaluation  Num Of Fetuses:         1   Fetal Heart Rate(bpm):  138  Cardiac Activity:       Observed  Presentation:           Variable  Placenta:               Fundal  P. Cord Insertion:      Visualized, central  Amniotic Fluid  AFI FV:      Within normal limits                              Largest Pocket(cm)                              6.99 ---------------------------------------------------------------------- Biometry  BPD:      48.2  mm  G. Age:  20w 4d         87  %    CI:        75.13   %    70 - 86                                                          FL/HC:      17.7   %    16.8 - 19.8  HC:      176.4  mm     G. Age:  20w 1d         69  %    HC/AC:      1.07        1.09 - 1.39  AC:      164.6  mm     G. Age:  21w 4d         94  %    FL/BPD:     64.9   %  FL:       31.3  mm     G. Age:  19w 5d         48  %    FL/AC:      19.0   %    20 - 24  HUM:      29.6  mm     G. Age:  19w 5d         55  %  CER:      21.1  mm     G. Age:  20w 0d         79  %  NFT:       4.5  mm  LV:        5.1  mm  CM:        4.9  mm  Est. FW:     367  gm    0 lb 13 oz      95  % ---------------------------------------------------------------------- OB History  Gravidity:    4         Term:   3  Living:       3 ---------------------------------------------------------------------- Gestational Age  LMP:           19w 4d        Date:  11/08/22                  EDD:   08/15/23  U/S Today:     20w 4d                                        EDD:   08/08/23  Best:          19w 4d     Det. By:  LMP  (11/08/22)          EDD:   08/15/23 ---------------------------------------------------------------------- Anatomy  Cranium:               Appears normal         LVOT:                   Appears normal  Cavum:  Appears normal         Aortic Arch:            Appears normal  Ventricles:            Appears normal         Ductal Arch:            Appears normal  Choroid Plexus:        Appears normal         Diaphragm:              Appears normal  Cerebellum:             Appears normal         Stomach:                Appears normal, left                                                                        sided  Posterior Fossa:       Appears normal         Abdomen:                Appears normal  Nuchal Fold:           Appears normal         Abdominal Wall:         Appears nml (cord                                                                        insert, abd wall)  Face:                  Appears normal         Cord Vessels:           Appears normal (3                         (orbits and profile)                           vessel cord)  Lips:                  Appears normal         Kidneys:                Appear normal  Palate:                Appears normal         Bladder:                Appears normal  Thoracic:              Appears normal         Spine:                  Appears normal  Heart:  Appears normal         Upper Extremities:      Appears normal                         (4CH, axis, and                         situs)  RVOT:                  Appears normal         Lower Extremities:      Appears normal  Other:  Fetus appears to be a female. Heels/feet and open hands/5th digits,          Nasal bone, lenses, maxilla, mandible and falx, VC, 3VV and 3VTV          visualized. ---------------------------------------------------------------------- Cervix Uterus Adnexa  Cervix  Length:            4.3  cm.  Normal appearance by transabdominal scan  Uterus  No abnormality visualized.  Right Ovary  Size(cm)     3.74   x   1.78   x  2.14      Vol(ml): 7.46  Within normal limits.  Left Ovary  Size(cm)     3.38   x   2.86   x  2.13      Vol(ml): 10.78  Within normal limits.  Cul De Sac  No free fluid seen.  Adnexa  No adnexal mass visualized ---------------------------------------------------------------------- Impression  G4 P3003.  Patient is here for fetal anatomy scan.  On cell-free fetal DNA screening, the risks of aneuploidies  are not increased.  Obstetric  history significant for 3 term vaginal deliveries.  She  delivered her last child in October 2023 and reports having  had regular menstrual cycles before this pregnancy.  She has a diagnosis of chronic hypertension that is well-  controlled without antihypertensives.  We performed fetal anatomy scan. No makers of  aneuploidies or fetal structural defects are seen. Fetal  biometry is consistent with her previously-established dates.  Amniotic fluid is normal and good fetal activity is seen.  Patient understands the limitations of ultrasound in detecting  fetal anomalies. ---------------------------------------------------------------------- Recommendations  -An appointment was made for her to return in 6 weeks for  fetal growth assessment. ----------------------------------------------------------------------                 Noralee Space, MD Electronically Signed Final Report   03/25/2023 11:12 am ----------------------------------------------------------------------    Assessment and Plan:  Pregnancy: G4W1027 at [redacted]w[redacted]d 1. Nausea/vomiting in pregnancy Reglan and Scopolamine patch prescribed.  Will monitor response.  - metoCLOPramide (REGLAN) 10 MG tablet; Take 1 tablet (10 mg total) by mouth 4 (four) times daily as needed for nausea or vomiting.  Dispense: 30 tablet; Refill: 2 - scopolamine (TRANSDERM-SCOP) 1 MG/3DAYS; Place 1 patch (1.5 mg total) onto the skin every 3 (three) days.  Dispense: 10 patch; Refill: 12  2. Chronic hypertension during pregnancy, antepartum 3. Hx of preeclampsia, prior pregnancy, currently pregnant Stable BP. Continue ldASA.  4. [redacted] weeks gestation of pregnancy 5. Supervision of high risk pregnancy, antepartum, second trimester Follow up scans as per MFM.  Preterm labor symptoms and general obstetric precautions including but not limited to vaginal bleeding, contractions, leaking of fluid and fetal movement were reviewed in detail with the patient. Please refer to After Visit  Summary for other counseling recommendations.  No follow-ups on file.  Future Appointments  Date Time Provider Department Center  05/06/2023 11:00 AM [facility] US1 [facility]  05/13/2023  1:10 PM Levie Heritage, DO [facility] None  05/28/2023  8:35 AM Tarig Zimmers, Jethro Bastos, MD [facility] None  06/10/2023  2:30 PM Levie Heritage, DO [facility] None    Jaynie Collins, MD

## 2023-05-06 ENCOUNTER — Other Ambulatory Visit: Payer: Self-pay | Admitting: *Deleted

## 2023-05-06 ENCOUNTER — Ambulatory Visit: Payer: Medicaid Other | Attending: Obstetrics and Gynecology

## 2023-05-06 DIAGNOSIS — O99212 Obesity complicating pregnancy, second trimester: Secondary | ICD-10-CM | POA: Diagnosis not present

## 2023-05-06 DIAGNOSIS — O10012 Pre-existing essential hypertension complicating pregnancy, second trimester: Secondary | ICD-10-CM

## 2023-05-06 DIAGNOSIS — O09899 Supervision of other high risk pregnancies, unspecified trimester: Secondary | ICD-10-CM | POA: Diagnosis present

## 2023-05-06 DIAGNOSIS — E669 Obesity, unspecified: Secondary | ICD-10-CM | POA: Diagnosis not present

## 2023-05-06 DIAGNOSIS — O09292 Supervision of pregnancy with other poor reproductive or obstetric history, second trimester: Secondary | ICD-10-CM | POA: Diagnosis not present

## 2023-05-06 DIAGNOSIS — O09299 Supervision of pregnancy with other poor reproductive or obstetric history, unspecified trimester: Secondary | ICD-10-CM | POA: Insufficient documentation

## 2023-05-06 DIAGNOSIS — O10912 Unspecified pre-existing hypertension complicating pregnancy, second trimester: Secondary | ICD-10-CM | POA: Insufficient documentation

## 2023-05-06 DIAGNOSIS — Z3A25 25 weeks gestation of pregnancy: Secondary | ICD-10-CM

## 2023-05-13 ENCOUNTER — Ambulatory Visit (INDEPENDENT_AMBULATORY_CARE_PROVIDER_SITE_OTHER): Payer: Medicaid Other | Admitting: Family Medicine

## 2023-05-13 VITALS — BP 114/66 | HR 88 | Wt 190.0 lb

## 2023-05-13 DIAGNOSIS — O09899 Supervision of other high risk pregnancies, unspecified trimester: Secondary | ICD-10-CM

## 2023-05-13 DIAGNOSIS — O10919 Unspecified pre-existing hypertension complicating pregnancy, unspecified trimester: Secondary | ICD-10-CM

## 2023-05-13 DIAGNOSIS — Z6830 Body mass index (BMI) 30.0-30.9, adult: Secondary | ICD-10-CM

## 2023-05-13 DIAGNOSIS — O0992 Supervision of high risk pregnancy, unspecified, second trimester: Secondary | ICD-10-CM

## 2023-05-13 DIAGNOSIS — O09299 Supervision of pregnancy with other poor reproductive or obstetric history, unspecified trimester: Secondary | ICD-10-CM

## 2023-05-13 DIAGNOSIS — Z3A26 26 weeks gestation of pregnancy: Secondary | ICD-10-CM

## 2023-05-13 NOTE — Progress Notes (Signed)
   PRENATAL VISIT NOTE  Subjective:  Diana Fuentes is a 32 y.o. [phone] at [redacted]w[redacted]d being seen today for ongoing prenatal care.  She is currently monitored for the following issues for this high-risk pregnancy and has Adrenal adenoma, left; Depression; Hx of preeclampsia, prior pregnancy, currently pregnant; Chronic hypertension during pregnancy, antepartum; Supervision of high risk pregnancy, antepartum, second trimester; Short interval between pregnancies affecting pregnancy, antepartum; and Obesity affecting pregnancy on their problem list.  Patient reports no complaints.  Contractions: Not present. Vag. Bleeding: None.  Movement: Present. Denies leaking of fluid.   The following portions of the patient's history were reviewed and updated as appropriate: allergies, current medications, past family history, past medical history, past social history, past surgical history and problem list.   Objective:   Vitals:   05/13/23 1306  BP: 114/66  Pulse: 88  Weight: 190 lb (86.2 kg)    Fetal Status: Fetal Heart Rate (bpm): 154   Movement: Present     General:  Alert, oriented and cooperative. Patient is in no acute distress.  Skin: Skin is warm and dry. No rash noted.   Cardiovascular: Normal heart rate noted  Respiratory: Normal respiratory effort, no problems with respiration noted  Abdomen: Soft, gravid, appropriate for gestational age.  Pain/Pressure: Present     Pelvic: Cervical exam deferred        Extremities: Normal range of motion.  Edema: None  Mental Status: Normal mood and affect. Normal behavior. Normal judgment and thought content.   Assessment and Plan:  Pregnancy: G4P3003 at [redacted]w[redacted]d 1. [redacted] weeks gestation of pregnancy FHT normal  2. Supervision of high risk pregnancy, antepartum, second trimester  3. Chronic hypertension during pregnancy, antepartum BP controlled. On ASA 81mg   4. Hx of preeclampsia, prior pregnancy, currently pregnant  5. Short interval between  pregnancies affecting pregnancy, antepartum  6. BMI 30.0-30.9,adult   Preterm labor symptoms and general obstetric precautions including but not limited to vaginal bleeding, contractions, leaking of fluid and fetal movement were reviewed in detail with the patient. Please refer to After Visit Summary for other counseling recommendations.   No follow-ups on file.  Future Appointments  Date Time Provider Department Center  05/28/2023  8:35 AM Anyanwu, Jethro Bastos, MD [facility] None  06/03/2023  3:15 PM [facility] NURSE [facility]  06/03/2023  3:30 PM [facility] US1 [facility]  06/10/2023  2:30 PM Levie Heritage, DO [facility] None  06/24/2023  2:50 PM Adam Phenix, MD [facility] None  07/08/2023  2:30 PM Levie Heritage, DO [facility] None    Levie Heritage, DO

## 2023-05-28 ENCOUNTER — Encounter: Payer: Medicaid Other | Admitting: Obstetrics & Gynecology

## 2023-06-02 ENCOUNTER — Encounter: Payer: Medicaid Other | Admitting: Family Medicine

## 2023-06-03 ENCOUNTER — Other Ambulatory Visit: Payer: Self-pay

## 2023-06-03 ENCOUNTER — Inpatient Hospital Stay (HOSPITAL_COMMUNITY)
Admission: AD | Admit: 2023-06-03 | Discharge: 2023-06-03 | Disposition: A | Discharge status: Home / Self Care | Payer: Medicaid Other | Attending: Obstetrics and Gynecology | Admitting: Obstetrics and Gynecology

## 2023-06-03 ENCOUNTER — Ambulatory Visit: Payer: Medicaid Other

## 2023-06-03 ENCOUNTER — Encounter (HOSPITAL_COMMUNITY): Payer: Self-pay | Admitting: Obstetrics and Gynecology

## 2023-06-03 DIAGNOSIS — Z3A29 29 weeks gestation of pregnancy: Secondary | ICD-10-CM | POA: Diagnosis not present

## 2023-06-03 DIAGNOSIS — O211 Hyperemesis gravidarum with metabolic disturbance: Secondary | ICD-10-CM | POA: Insufficient documentation

## 2023-06-03 DIAGNOSIS — R112 Nausea with vomiting, unspecified: Secondary | ICD-10-CM | POA: Diagnosis present

## 2023-06-03 DIAGNOSIS — O210 Mild hyperemesis gravidarum: Secondary | ICD-10-CM | POA: Diagnosis not present

## 2023-06-03 DIAGNOSIS — E876 Hypokalemia: Secondary | ICD-10-CM

## 2023-06-03 LAB — URINALYSIS, ROUTINE W REFLEX MICROSCOPIC
Bacteria, UA: NONE SEEN
Bilirubin Urine: NEGATIVE
Glucose, UA: 50 mg/dL — AB
Hgb urine dipstick: NEGATIVE
Ketones, ur: 80 mg/dL — AB
Leukocytes,Ua: NEGATIVE
Nitrite: NEGATIVE
Protein, ur: 100 mg/dL — AB
Specific Gravity, Urine: 1.028 (ref 1.005–1.030)
pH: 5 (ref 5.0–8.0)

## 2023-06-03 LAB — COMPREHENSIVE METABOLIC PANEL
ALT: 10 U/L (ref 0–44)
AST: 11 U/L — ABNORMAL LOW (ref 15–41)
Albumin: 3 g/dL — ABNORMAL LOW (ref 3.5–5.0)
Alkaline Phosphatase: 85 U/L (ref 38–126)
Anion gap: 16 — ABNORMAL HIGH (ref 5–15)
BUN: 8 mg/dL (ref 6–20)
CO2: 20 mmol/L — ABNORMAL LOW (ref 22–32)
Calcium: 8.4 mg/dL — ABNORMAL LOW (ref 8.9–10.3)
Chloride: 100 mmol/L (ref 98–111)
Creatinine, Ser: 0.58 mg/dL (ref 0.44–1.00)
GFR, Estimated: >60 mL/min (ref >60)
Glucose, Bld: 120 mg/dL — ABNORMAL HIGH (ref 70–99)
Potassium: 2.9 mmol/L — ABNORMAL LOW (ref 3.5–5.1)
Sodium: 136 mmol/L (ref 135–145)
Total Bilirubin: 0.7 mg/dL (ref 0.3–1.2)
Total Protein: 6.3 g/dL — ABNORMAL LOW (ref 6.5–8.1)

## 2023-06-03 LAB — CBC
HCT: 31.3 % — ABNORMAL LOW (ref 36.0–46.0)
Hemoglobin: 10.5 g/dL — ABNORMAL LOW (ref 12.0–15.0)
MCH: 29.7 pg (ref 26.0–34.0)
MCHC: 33.5 g/dL (ref 30.0–36.0)
MCV: 88.4 fL (ref 80.0–100.0)
Platelets: 303 10*3/uL (ref 150–400)
RBC: 3.54 MIL/uL — ABNORMAL LOW (ref 3.87–5.11)
RDW: 11.9 % (ref 11.5–15.5)
WBC: 17.5 10*3/uL — ABNORMAL HIGH (ref 4.0–10.5)
nRBC: 0 % (ref 0.0–0.2)

## 2023-06-03 LAB — MAGNESIUM: Magnesium: 1.6 mg/dL — ABNORMAL LOW (ref 1.7–2.4)

## 2023-06-03 MED ORDER — FAMOTIDINE IN NACL 20-0.9 MG/50ML-% IV SOLN
20.0000 mg | Freq: Once | INTRAVENOUS | Status: AC
  Administered 2023-06-03: 20 mg via INTRAVENOUS
  Filled 2023-06-03: qty 50

## 2023-06-03 MED ORDER — POTASSIUM CHLORIDE CRYS ER 20 MEQ PO TBCR
40.0000 meq | EXTENDED_RELEASE_TABLET | Freq: Once | ORAL | Status: AC
  Administered 2023-06-03: 40 meq via ORAL
  Filled 2023-06-03 (×2): qty 2

## 2023-06-03 MED ORDER — SODIUM CHLORIDE 0.9 % IV SOLN
8.0000 mg | Freq: Once | INTRAVENOUS | Status: DC
  Filled 2023-06-03: qty 4

## 2023-06-03 MED ORDER — LACTATED RINGERS IV SOLN: Freq: Once | INTRAVENOUS | Status: AC

## 2023-06-03 MED ORDER — SODIUM CHLORIDE 0.9 % IV SOLN
25.0000 mg | Freq: Once | INTRAVENOUS | Status: AC
  Administered 2023-06-03: 25 mg via INTRAVENOUS
  Filled 2023-06-03: qty 1

## 2023-06-03 MED ORDER — PROMETHAZINE HCL 25 MG PO TABS: 25.0000 mg | ORAL_TABLET | Freq: Four times a day (QID) | ORAL | 0 refills | Status: DC | PRN

## 2023-06-03 MED ORDER — SCOPOLAMINE 1 MG/3DAYS TD PT72
1.0000 | MEDICATED_PATCH | TRANSDERMAL | Status: DC
  Administered 2023-06-03: 1.5 mg via TRANSDERMAL
  Filled 2023-06-03: qty 1

## 2023-06-03 MED ORDER — LACTATED RINGERS IV BOLUS
1000.0000 mL | Freq: Once | INTRAVENOUS | Status: AC
  Administered 2023-06-03: 1000 mL via INTRAVENOUS

## 2023-06-03 MED ORDER — SCOPOLAMINE 1 MG/3DAYS TD PT72: 1.0000 | MEDICATED_PATCH | TRANSDERMAL | 12 refills | Status: DC

## 2023-06-03 MED ORDER — POTASSIUM CHLORIDE 10 MEQ/100ML IV SOLN
10.0000 meq | INTRAVENOUS | Status: DC
  Filled 2023-06-03 (×5): qty 100

## 2023-06-03 MED ORDER — MAGNESIUM SULFATE 2 GM/50ML IV SOLN
2.0000 g | Freq: Once | INTRAVENOUS | Status: AC
  Administered 2023-06-03: 2 g via INTRAVENOUS
  Filled 2023-06-03: qty 50

## 2023-06-03 MED ORDER — FAMOTIDINE 20 MG PO TABS: 20.0000 mg | ORAL_TABLET | Freq: Two times a day (BID) | ORAL | 0 refills | Status: DC

## 2023-06-03 NOTE — MAU Provider Note (Addendum)
History     CSN: [id number]  Arrival date and time: 06/03/23 0350   Event Date/Time   First Provider Initiated Contact with Patient 06/03/23 [phone]      Chief Complaint  Patient presents with   Emesis   Nausea   HPI Diana Fuentes is a 32 y.o. G4P3003 at [redacted]w[redacted]d who presents with complaint of nausea and vomiting in setting of known hyperemesis gravidarum. Patient with several prior MAU admissions for nausea and vomiting. She reports she was doing well since her last admission in June and had stopped taking medication because she was feeling better. Reports since yesterday around 0300, she started having constant vomiting with anything she ate. Reports retching and vomiting ever 30-45 minutes. She noticed some bright red blood in her emesis as well as green bile. She had not tried any of her home antiemetics because she recently moved and wasn't able to locate them. She has not had much to eat or drink secondary to nausea/vomiting. She denies VB, LOF. She has noticed some decreased fetal movement since her bouts of emesis started.  Past Medical History:  Diagnosis Date   Adrenal adenoma    Anxiety    Chlamydia    Depression    Ear infection     Past Surgical History:  Procedure Laterality Date   LAPAROSCOPIC LYSIS OF ADHESIONS N/A 11/02/2018   Procedure: LAPAROSCOPIC LYSIS OF ADHESIONS;  Surgeon: Allie Bossier, MD;  Location: [facility];  Service: Gynecology;  Laterality: N/A;   LAPAROSCOPY N/A 11/02/2018   Procedure: LAPAROSCOPY DIAGNOSTIC;  Surgeon: Allie Bossier, MD;  Location: [facility];  Service: Gynecology;  Laterality: N/A;   WISDOM TOOTH EXTRACTION      Family History  Problem Relation Age of Onset   Hypertension Father    Diabetes Maternal Aunt    Diabetes Maternal Uncle    Diabetes Maternal Grandmother     Social History   Tobacco Use   Smoking status: Former   Smokeless tobacco: Current   Tobacco comments:    vape  Vaping Use    Vaping status: Former   Substances: Flavoring  Substance Use Topics   Alcohol use: Not Currently    Comment: occ   Drug use: Not Currently    Types: Marijuana    Comment: last used Mar 2024    Allergies:  Allergies  Allergen Reactions   Shellfish Allergy Rash and Shortness Of Breath    Medications Prior to Admission  Medication Sig Dispense Refill Last Dose   aspirin EC 81 MG tablet Take 1 tablet (81 mg total) by mouth daily. Swallow whole. 30 tablet 12    metoCLOPramide (REGLAN) 10 MG tablet Take 1 tablet (10 mg total) by mouth 4 (four) times daily as needed for nausea or vomiting. (Patient not taking: Reported on 05/13/2023) 30 tablet 2    pantoprazole (PROTONIX) 40 MG tablet Take 1 tablet (40 mg total) by mouth daily. 30 tablet 5    Prenatal Vit-Fe Fumarate-FA (PRENATAL VITAMINS PO) Take by mouth.      scopolamine (TRANSDERM-SCOP) 1 MG/3DAYS Place 1 patch (1.5 mg total) onto the skin every 3 (three) days. (Patient not taking: Reported on 05/13/2023) 10 patch 12    ROS reviewed and pertinent positives and negatives as documented in HPI.  Physical Exam   Blood pressure 119/68, pulse 65, temperature 98.2 F (36.8 C), temperature source Oral, resp. rate 16, height 5\' 6"  (1.676 m), weight 85.1 kg, last menstrual period 11/08/2022, SpO2  100%, unknown if currently breastfeeding.  Physical Exam Constitutional:      General: She is not in acute distress.    Appearance: Normal appearance.     Comments: Tired  HENT:     Head: Normocephalic and atraumatic.     Mouth/Throat:     Mouth: Mucous membranes are dry.  Cardiovascular:     Rate and Rhythm: Normal rate and regular rhythm.     Heart sounds: Normal heart sounds.  Pulmonary:     Effort: Pulmonary effort is normal. No respiratory distress.     Breath sounds: Normal breath sounds.  Abdominal:     General: There is no distension.     Palpations: Abdomen is soft.     Tenderness: There is no abdominal tenderness.   Musculoskeletal:        General: Normal range of motion.  Skin:    General: Skin is warm and dry.     Findings: No rash.  Neurological:     General: No focal deficit present.     Mental Status: She is alert and oriented to person, place, and time.   REASSURING NST (for gestational age) - FHR: 140 bpm / moderate variability / 10 x 10 accels present / decels absent / TOCO: none  Reassessment @ 0935: Patient feeling better. magnesium infusion completed.  MAU Course  Procedures  MDM 32 y.o. [id number] at [redacted]w[redacted]d with history of HG presenting with acute nausea and vomiting x 1 day. History of the same w symptoms that improve with IV rehydration, antiemetics, and PPI or Pepcid. Vital signs are stable, exam notable for dry MM, tired appearing pt. Will check labs to evaluate for electrolyte derangements. Treating nausea/vomiting with scop patch, IV Phenergan (pt reports significant constipation with Zofran) and Pepcid. 1L LR ordered as well. Will re-eval.   [id number] K returned at 2.9 -- will replete (initially ordered IV, but pt ok with PO), checking mag level. Has had reactive NST.  0800 Mag 1.6, will replete. Pt able to tolerate PO potassium pills. Will start PO trial with saltines/fluids.  9811 Awaiting completion of magnesium repletion, will place back on monitor to re-eval FHR. Signout given to [employer], CNM.   [name], MD OB Fellow, Faculty Practice [facility], [facility]   Assessment and Plan  1. Hyperemesis complicating pregnancy, antepartum - Rx: Phenergan 25 mg po every 6 hrs prn N/V - Rx: Pepcid 20 mg po BID - Rx: Scopolamine patch 1 every 72 hrs prn N/V   2. Hypomagnesemia  3. Hypokalemia  4. [redacted] weeks gestation of pregnancy   - Discharge patient - Keep scheduled appt with [facility] on 06/10/2023 - Patient verbalized an understanding of the plan of care and agrees.   Raelyn Mora, CNM 06/03/2023, 8:31 AM

## 2023-06-03 NOTE — MAU Note (Signed)
.  Diana Fuentes is a 32 y.o. at [redacted]w[redacted]d here in MAU reporting: yesterday morning started having N/V has persisted into today - unable to sleep because she is waking up to vomit every 30-45 minutes. Reports unable to tolerate food or water. Has not taken nausea medications. Reports discomfort feeling in upper abdomen. Denies VB or LOF. Reports less FM since vomiting started.   Pain score: 8 Vitals:   06/03/23 0411  BP: 131/77  Pulse: 69  Resp: 16  Temp: 98.2 F (36.8 C)  SpO2: 100%     FHT:152 Lab orders placed from triage:  UA

## 2023-06-04 ENCOUNTER — Encounter (HOSPITAL_COMMUNITY): Payer: Self-pay | Admitting: Obstetrics and Gynecology

## 2023-06-04 ENCOUNTER — Inpatient Hospital Stay (HOSPITAL_COMMUNITY)
Admission: AD | Admit: 2023-06-04 | Discharge: 2023-06-04 | Disposition: A | Discharge status: Home / Self Care | Payer: Medicaid Other | Attending: Obstetrics and Gynecology | Admitting: Obstetrics and Gynecology

## 2023-06-04 DIAGNOSIS — R5383 Other fatigue: Secondary | ICD-10-CM | POA: Diagnosis present

## 2023-06-04 DIAGNOSIS — Z3A29 29 weeks gestation of pregnancy: Secondary | ICD-10-CM | POA: Insufficient documentation

## 2023-06-04 DIAGNOSIS — O210 Mild hyperemesis gravidarum: Secondary | ICD-10-CM | POA: Insufficient documentation

## 2023-06-04 DIAGNOSIS — R1010 Upper abdominal pain, unspecified: Secondary | ICD-10-CM | POA: Diagnosis present

## 2023-06-04 DIAGNOSIS — R112 Nausea with vomiting, unspecified: Secondary | ICD-10-CM | POA: Diagnosis present

## 2023-06-04 LAB — COMPREHENSIVE METABOLIC PANEL
ALT: 12 U/L (ref 0–44)
AST: 11 U/L — ABNORMAL LOW (ref 15–41)
Albumin: 3 g/dL — ABNORMAL LOW (ref 3.5–5.0)
Alkaline Phosphatase: 78 U/L (ref 38–126)
Anion gap: 15 (ref 5–15)
BUN: 8 mg/dL (ref 6–20)
CO2: 20 mmol/L — ABNORMAL LOW (ref 22–32)
Calcium: 8.4 mg/dL — ABNORMAL LOW (ref 8.9–10.3)
Chloride: 102 mmol/L (ref 98–111)
Creatinine, Ser: 0.54 mg/dL (ref 0.44–1.00)
GFR, Estimated: >60 mL/min (ref >60)
Glucose, Bld: 123 mg/dL — ABNORMAL HIGH (ref 70–99)
Potassium: 3.4 mmol/L — ABNORMAL LOW (ref 3.5–5.1)
Sodium: 137 mmol/L (ref 135–145)
Total Bilirubin: 0.9 mg/dL (ref 0.3–1.2)
Total Protein: 6.4 g/dL — ABNORMAL LOW (ref 6.5–8.1)

## 2023-06-04 LAB — CBC
HCT: 33 % — ABNORMAL LOW (ref 36.0–46.0)
Hemoglobin: 10.7 g/dL — ABNORMAL LOW (ref 12.0–15.0)
MCH: 29.6 pg (ref 26.0–34.0)
MCHC: 32.4 g/dL (ref 30.0–36.0)
MCV: 91.2 fL (ref 80.0–100.0)
Platelets: 309 10*3/uL (ref 150–400)
RBC: 3.62 MIL/uL — ABNORMAL LOW (ref 3.87–5.11)
RDW: 12 % (ref 11.5–15.5)
WBC: 13.7 10*3/uL — ABNORMAL HIGH (ref 4.0–10.5)
nRBC: 0 % (ref 0.0–0.2)

## 2023-06-04 LAB — URINALYSIS, ROUTINE W REFLEX MICROSCOPIC
Bilirubin Urine: NEGATIVE
Glucose, UA: NEGATIVE mg/dL
Hgb urine dipstick: NEGATIVE
Ketones, ur: 80 mg/dL — AB
Nitrite: NEGATIVE
Protein, ur: 30 mg/dL — AB
Specific Gravity, Urine: 1.017 (ref 1.005–1.030)
pH: 6 (ref 5.0–8.0)

## 2023-06-04 LAB — MAGNESIUM: Magnesium: 1.9 mg/dL (ref 1.7–2.4)

## 2023-06-04 MED ORDER — PROCHLORPERAZINE EDISYLATE 10 MG/2ML IJ SOLN
10.0000 mg | INTRAMUSCULAR | Status: AC
  Administered 2023-06-04: 10 mg via INTRAVENOUS
  Filled 2023-06-04: qty 2

## 2023-06-04 MED ORDER — PANTOPRAZOLE SODIUM 40 MG IV SOLR
40.0000 mg | Freq: Once | INTRAVENOUS | Status: AC
  Administered 2023-06-04: 40 mg via INTRAVENOUS
  Filled 2023-06-04: qty 10

## 2023-06-04 MED ORDER — LACTATED RINGERS IV BOLUS
1000.0000 mL | Freq: Once | INTRAVENOUS | Status: AC
  Administered 2023-06-04: 1000 mL via INTRAVENOUS

## 2023-06-04 NOTE — MAU Provider Note (Signed)
Patient signed out from Dr. Lucianne Muss.  I assessed patient. Electrolytes within normal limits from labs today. Patient also felt much improved and was eating graham crackers without N/V. Verbalized that she had a ride to pharmacy and will pick up prescriptions upon discharge.  Discharge order placed.  Joanne Gavel, MD

## 2023-06-04 NOTE — MAU Note (Signed)
.  Diana Fuentes is a 32 y.o. at [redacted]w[redacted]d here in MAU reporting n/v all night and unable to keep down anything. Was in MAU Thurs with same symptoms. Reports good FM and denies LOF or VB. Has discomfort in upper abdomen. Was unable to get prescriptions from [facility] as she had meds that made her sleepy and has been sleeping  Onset of complaint: Thurs Pain score: 7 Vitals:   06/04/23 0616 06/04/23 0620  BP:  (!) 146/76  Pulse: 62   Resp: 16   Temp: (!) 97.4 F (36.3 C)   SpO2: 100%      FHT:145 Lab orders placed from triage:  u/a

## 2023-06-04 NOTE — MAU Provider Note (Addendum)
History     CSN: [id number]  Arrival date and time: 06/04/23 0547   Event Date/Time   First Provider Initiated Contact with Patient 06/04/23 0701      Chief Complaint  Patient presents with   Nausea   Emesis During Pregnancy   Fatigue   HPI Diana Fuentes is a 32 y.o. G4P3003 at [redacted]w[redacted]d with history of hyperemesis gravidarum who presents to MAU with complaint of nausea, vomiting, abdominal discomfort. She was seen in MAU yesterday for similar symptoms. She was given IV fluids, antiemetics, H2 blocker, and electrolytes were repleted with relief of symptoms and patient passed PO challenge. She reports Phenergan caused drowsiness, so she went home and rested and was unable to get her prescriptions from the pharmacy. Currently having nausea with vomiting with any PO intake. No fevers, chills. No VB, LOF, ctx. Having good FM.  Past Medical History:  Diagnosis Date   Adrenal adenoma    Anxiety    Chlamydia    Depression    Ear infection     Past Surgical History:  Procedure Laterality Date   LAPAROSCOPIC LYSIS OF ADHESIONS N/A 11/02/2018   Procedure: LAPAROSCOPIC LYSIS OF ADHESIONS;  Surgeon: Allie Bossier, MD;  Location: [facility];  Service: Gynecology;  Laterality: N/A;   LAPAROSCOPY N/A 11/02/2018   Procedure: LAPAROSCOPY DIAGNOSTIC;  Surgeon: Allie Bossier, MD;  Location: [facility];  Service: Gynecology;  Laterality: N/A;   WISDOM TOOTH EXTRACTION      Family History  Problem Relation Age of Onset   Hypertension Father    Diabetes Maternal Aunt    Diabetes Maternal Uncle    Diabetes Maternal Grandmother     Social History   Tobacco Use   Smoking status: Former   Smokeless tobacco: Current   Tobacco comments:    vape  Vaping Use   Vaping status: Former   Substances: Flavoring  Substance Use Topics   Alcohol use: Not Currently    Comment: occ   Drug use: Not Currently    Types: Marijuana    Comment: last used Mar 2024     Allergies:  Allergies  Allergen Reactions   Shellfish Allergy Rash and Shortness Of Breath    Medications Prior to Admission  Medication Sig Dispense Refill Last Dose   pantoprazole (PROTONIX) 40 MG tablet Take 1 tablet (40 mg total) by mouth daily. 30 tablet 5 06/03/2023   aspirin EC 81 MG tablet Take 1 tablet (81 mg total) by mouth daily. Swallow whole. 30 tablet 12    famotidine (PEPCID) 20 MG tablet Take 1 tablet (20 mg total) by mouth 2 (two) times daily. 60 tablet 0    metoCLOPramide (REGLAN) 10 MG tablet Take 1 tablet (10 mg total) by mouth 4 (four) times daily as needed for nausea or vomiting. (Patient not taking: Reported on 05/13/2023) 30 tablet 2    Prenatal Vit-Fe Fumarate-FA (PRENATAL VITAMINS PO) Take by mouth.      promethazine (PHENERGAN) 25 MG tablet Take 1 tablet (25 mg total) by mouth every 6 (six) hours as needed for nausea or vomiting. 60 tablet 0    [START ON 06/06/2023] scopolamine (TRANSDERM-SCOP) 1 MG/3DAYS Place 1 patch (1.5 mg total) onto the skin every 3 (three) days. 10 patch 12    ROS reviewed and pertinent positives and negatives as documented in HPI.  Physical Exam   Blood pressure (!) 146/76, pulse 62, temperature (!) 97.4 F (36.3 C), resp. rate 16, last menstrual period  11/08/2022, SpO2 100%, unknown if currently breastfeeding.  Physical Exam Constitutional:      Appearance: Normal appearance.  HENT:     Head: Normocephalic and atraumatic.  Cardiovascular:     Rate and Rhythm: Normal rate.  Pulmonary:     Effort: Pulmonary effort is normal. No respiratory distress.     Breath sounds: Normal breath sounds.  Abdominal:     Palpations: Abdomen is soft.     Tenderness: There is no abdominal tenderness. There is no guarding or rebound.  Musculoskeletal:        General: Normal range of motion.  Skin:    General: Skin is warm and dry.     Comments: Dry MM  Neurological:     General: No focal deficit present.     Mental Status: She is alert and  oriented to person, place, and time.   EFM: 140/mod/+10x10 accels/no decels  MAU Course  Procedures  MDM 32 y.o. [id number] at [redacted]w[redacted]d who presents for nausea and vomiting with hx known hyperemesis. She was seen yesterday for the same symptoms, had relief of symptoms with antiemetics, antiacid and IV hydration/electrolyte repletion. She was unable to fill prescriptions at pharmacy and was unable to tx n/v when it recurred last night, thus prompting presentation to MAU today. On exam, clinically dry, but overall much improved from presentation yesterday. Had elevated BP on initial read, but repeat was normotensive. Will order labs to determine need for electrolyte repletion, IV fluids, and give Compazine and Protonix IV -- anticipate this will cause less ASE of drowsiness.  8:11 AM  Patient signed out to Diana Simmer, MD.  Assessment and Plan    Diana Fuentes 06/04/2023, 7:11 AM

## 2023-06-07 ENCOUNTER — Ambulatory Visit: Payer: Medicaid Other | Attending: Maternal & Fetal Medicine

## 2023-06-07 ENCOUNTER — Ambulatory Visit: Payer: Medicaid Other | Admitting: *Deleted

## 2023-06-07 ENCOUNTER — Encounter: Payer: Self-pay | Admitting: *Deleted

## 2023-06-07 VITALS — BP 102/58 | HR 81

## 2023-06-07 DIAGNOSIS — O99213 Obesity complicating pregnancy, third trimester: Secondary | ICD-10-CM

## 2023-06-07 DIAGNOSIS — O10919 Unspecified pre-existing hypertension complicating pregnancy, unspecified trimester: Secondary | ICD-10-CM

## 2023-06-07 DIAGNOSIS — E669 Obesity, unspecified: Secondary | ICD-10-CM

## 2023-06-07 DIAGNOSIS — O99212 Obesity complicating pregnancy, second trimester: Secondary | ICD-10-CM | POA: Diagnosis present

## 2023-06-07 DIAGNOSIS — O10912 Unspecified pre-existing hypertension complicating pregnancy, second trimester: Secondary | ICD-10-CM | POA: Insufficient documentation

## 2023-06-07 DIAGNOSIS — O10013 Pre-existing essential hypertension complicating pregnancy, third trimester: Secondary | ICD-10-CM | POA: Diagnosis not present

## 2023-06-07 DIAGNOSIS — Z3A30 30 weeks gestation of pregnancy: Secondary | ICD-10-CM

## 2023-06-07 DIAGNOSIS — O09293 Supervision of pregnancy with other poor reproductive or obstetric history, third trimester: Secondary | ICD-10-CM

## 2023-06-08 ENCOUNTER — Other Ambulatory Visit: Payer: Self-pay | Admitting: *Deleted

## 2023-06-08 DIAGNOSIS — O10913 Unspecified pre-existing hypertension complicating pregnancy, third trimester: Secondary | ICD-10-CM

## 2023-06-10 ENCOUNTER — Encounter: Payer: Medicaid Other | Admitting: Family Medicine

## 2023-06-17 ENCOUNTER — Ambulatory Visit: Payer: Medicaid Other

## 2023-06-17 DIAGNOSIS — Z3483 Encounter for supervision of other normal pregnancy, third trimester: Secondary | ICD-10-CM

## 2023-06-17 DIAGNOSIS — Z3A31 31 weeks gestation of pregnancy: Secondary | ICD-10-CM

## 2023-06-17 NOTE — Progress Notes (Signed)
Patient presents for 2 hr GTT. Patient was sent to the lab to have labs drawn. Justine Cossin l Etsuko Dierolf, CMA  

## 2023-06-21 LAB — CBC
Hematocrit: 31.7 % — ABNORMAL LOW (ref 34.0–46.6)
Hemoglobin: 10.3 g/dL — ABNORMAL LOW (ref 11.1–15.9)
MCH: 29.9 pg (ref 26.6–33.0)
MCHC: 32.5 g/dL (ref 31.5–35.7)
MCV: 92 fL (ref 79–97)
Platelets: 269 10*3/uL (ref 150–450)
RBC: 3.44 x10E6/uL — ABNORMAL LOW (ref 3.77–5.28)
RDW: 11.9 % (ref 11.7–15.4)
WBC: 10.2 10*3/uL (ref 3.4–10.8)

## 2023-06-21 LAB — GLUCOSE TOLERANCE, 2 HOURS W/ 1HR
Glucose, 1 hour: 110 mg/dL (ref 70–179)
Glucose, 2 hour: 94 mg/dL (ref 70–152)
Glucose, Fasting: 74 mg/dL (ref 70–91)

## 2023-06-21 LAB — SYPHILIS: RPR W/REFLEX TO RPR TITER AND TREPONEMAL ANTIBODIES, TRADITIONAL SCREENING AND DIAGNOSIS ALGORITHM: RPR Ser Ql: NONREACTIVE

## 2023-06-21 LAB — HIV ANTIBODY (ROUTINE TESTING W REFLEX): HIV Screen 4th Generation wRfx: NONREACTIVE

## 2023-06-24 ENCOUNTER — Encounter: Payer: Medicaid Other | Admitting: Obstetrics & Gynecology

## 2023-07-06 ENCOUNTER — Other Ambulatory Visit: Payer: Self-pay

## 2023-07-06 ENCOUNTER — Ambulatory Visit: Payer: Medicaid Other | Attending: Maternal & Fetal Medicine

## 2023-07-06 VITALS — BP 116/63 | HR 84

## 2023-07-06 DIAGNOSIS — O10013 Pre-existing essential hypertension complicating pregnancy, third trimester: Secondary | ICD-10-CM

## 2023-07-06 DIAGNOSIS — O09293 Supervision of pregnancy with other poor reproductive or obstetric history, third trimester: Secondary | ICD-10-CM

## 2023-07-06 DIAGNOSIS — O99213 Obesity complicating pregnancy, third trimester: Secondary | ICD-10-CM | POA: Diagnosis not present

## 2023-07-06 DIAGNOSIS — E669 Obesity, unspecified: Secondary | ICD-10-CM | POA: Diagnosis not present

## 2023-07-06 DIAGNOSIS — O10919 Unspecified pre-existing hypertension complicating pregnancy, unspecified trimester: Secondary | ICD-10-CM | POA: Insufficient documentation

## 2023-07-06 DIAGNOSIS — Z3A34 34 weeks gestation of pregnancy: Secondary | ICD-10-CM

## 2023-07-06 DIAGNOSIS — O10913 Unspecified pre-existing hypertension complicating pregnancy, third trimester: Secondary | ICD-10-CM | POA: Diagnosis present

## 2023-07-08 ENCOUNTER — Ambulatory Visit: Payer: Medicaid Other | Admitting: Family Medicine

## 2023-07-08 VITALS — BP 122/71 | HR 75 | Wt 198.0 lb

## 2023-07-08 DIAGNOSIS — O10919 Unspecified pre-existing hypertension complicating pregnancy, unspecified trimester: Secondary | ICD-10-CM

## 2023-07-08 DIAGNOSIS — O99212 Obesity complicating pregnancy, second trimester: Secondary | ICD-10-CM

## 2023-07-08 DIAGNOSIS — O09899 Supervision of other high risk pregnancies, unspecified trimester: Secondary | ICD-10-CM

## 2023-07-08 DIAGNOSIS — O0992 Supervision of high risk pregnancy, unspecified, second trimester: Secondary | ICD-10-CM

## 2023-07-08 DIAGNOSIS — Z3A34 34 weeks gestation of pregnancy: Secondary | ICD-10-CM

## 2023-07-08 DIAGNOSIS — O09299 Supervision of pregnancy with other poor reproductive or obstetric history, unspecified trimester: Secondary | ICD-10-CM

## 2023-07-08 NOTE — Progress Notes (Signed)
   PRENATAL VISIT NOTE  Subjective:  Diana Fuentes is a 32 y.o. [phone] at [redacted]w[redacted]d being seen today for ongoing prenatal care.  She is currently monitored for the following issues for this high-risk pregnancy and has Adrenal adenoma, left; Depression; Hx of preeclampsia, prior pregnancy, currently pregnant; Chronic hypertension during pregnancy, antepartum; Supervision of high risk pregnancy, antepartum, second trimester; Short interval between pregnancies affecting pregnancy, antepartum; and Obesity affecting pregnancy on their problem list.  Patient reports no complaints.  Contractions: Irritability. Vag. Bleeding: None.  Movement: Present. Denies leaking of fluid.   The following portions of the patient's history were reviewed and updated as appropriate: allergies, current medications, past family history, past medical history, past social history, past surgical history and problem list.   Objective:   Vitals:   07/08/23 1425  BP: 122/71  Pulse: 75  Weight: 198 lb (89.8 kg)    Fetal Status: Fetal Heart Rate (bpm): 149   Movement: Present     General:  Alert, oriented and cooperative. Patient is in no acute distress.  Skin: Skin is warm and dry. No rash noted.   Cardiovascular: Normal heart rate noted  Respiratory: Normal respiratory effort, no problems with respiration noted  Abdomen: Soft, gravid, appropriate for gestational age.  Pain/Pressure: Present     Pelvic: Cervical exam deferred        Extremities: Normal range of motion.  Edema: None  Mental Status: Normal mood and affect. Normal behavior. Normal judgment and thought content.   Assessment and Plan:  Pregnancy: G4P3003 at [redacted]w[redacted]d 1. [redacted] weeks gestation of pregnancy  2. Supervision of high risk pregnancy, antepartum, second trimester Insufficient prenatal care -  FHT normal  3. Chronic hypertension during pregnancy, antepartum BP controlled. Growth normal Will plan on delivery at 40 weeks.  4. Short interval  between pregnancies affecting pregnancy, antepartum  5. Hx of preeclampsia, prior pregnancy, currently pregnant  6. Obesity affecting pregnancy in second trimester, unspecified obesity type   Preterm labor symptoms and general obstetric precautions including but not limited to vaginal bleeding, contractions, leaking of fluid and fetal movement were reviewed in detail with the patient. Please refer to After Visit Summary for other counseling recommendations.   No follow-ups on file.  Future Appointments  Date Time Provider Department Center  07/22/2023  2:10 PM Levie Heritage, DO [facility] None  07/29/2023  1:10 PM Levie Heritage, DO [facility] None  08/10/2023  3:10 PM Gerrit Heck, CNM [facility] None  08/19/2023  2:30 PM Levie Heritage, DO [facility] None    Levie Heritage, DO

## 2023-07-22 ENCOUNTER — Ambulatory Visit (INDEPENDENT_AMBULATORY_CARE_PROVIDER_SITE_OTHER): Payer: Medicaid Other | Admitting: Family Medicine

## 2023-07-22 ENCOUNTER — Other Ambulatory Visit (HOSPITAL_COMMUNITY)
Admission: RE | Admit: 2023-07-22 | Discharge: 2023-07-22 | Disposition: A | Discharge status: Home / Self Care | Payer: Medicaid Other | Source: Ambulatory Visit | Attending: Family Medicine | Admitting: Family Medicine

## 2023-07-22 VITALS — BP 125/70 | HR 68 | Wt 201.0 lb

## 2023-07-22 DIAGNOSIS — O0992 Supervision of high risk pregnancy, unspecified, second trimester: Secondary | ICD-10-CM | POA: Insufficient documentation

## 2023-07-22 DIAGNOSIS — O10919 Unspecified pre-existing hypertension complicating pregnancy, unspecified trimester: Secondary | ICD-10-CM

## 2023-07-22 DIAGNOSIS — O99212 Obesity complicating pregnancy, second trimester: Secondary | ICD-10-CM

## 2023-07-22 DIAGNOSIS — O09299 Supervision of pregnancy with other poor reproductive or obstetric history, unspecified trimester: Secondary | ICD-10-CM

## 2023-07-22 DIAGNOSIS — O09899 Supervision of other high risk pregnancies, unspecified trimester: Secondary | ICD-10-CM

## 2023-07-22 NOTE — Progress Notes (Signed)
   PRENATAL VISIT NOTE  Subjective:  Diana Fuentes is a 32 y.o. [phone] at [redacted]w[redacted]d being seen today for ongoing prenatal care.  She is currently monitored for the following issues for this high-risk pregnancy and has Adrenal adenoma, left; Depression; Hx of preeclampsia, prior pregnancy, currently pregnant; Chronic hypertension during pregnancy, antepartum; Supervision of high risk pregnancy, antepartum, second trimester; Short interval between pregnancies affecting pregnancy, antepartum; and Obesity affecting pregnancy on their problem list.  Patient reports no complaints.  Contractions: Irritability. Vag. Bleeding: None.  Movement: Present. Denies leaking of fluid.   The following portions of the patient's history were reviewed and updated as appropriate: allergies, current medications, past family history, past medical history, past social history, past surgical history and problem list.   Objective:   Vitals:   07/22/23 1416  BP: 125/70  Pulse: 68  Weight: 201 lb (91.2 kg)    Fetal Status: Fetal Heart Rate (bpm): 138   Movement: Present  Presentation: Vertex  General:  Alert, oriented and cooperative. Patient is in no acute distress.  Skin: Skin is warm and dry. No rash noted.   Cardiovascular: Normal heart rate noted  Respiratory: Normal respiratory effort, no problems with respiration noted  Abdomen: Soft, gravid, appropriate for gestational age.  Pain/Pressure: Present     Pelvic: Cervical exam deferred Dilation: 1.5 Effacement (%): 50 Station: -3  Extremities: Normal range of motion.  Edema: None  Mental Status: Normal mood and affect. Normal behavior. Normal judgment and thought content.   Assessment and Plan:  Pregnancy: G4P3003 at [redacted]w[redacted]d 1. Supervision of high risk pregnancy, antepartum, second trimester FHT normal Wants BTL - we discussed that she is past the 30 day papers mark. She will likely need to have an interval tubal  2. Chronic hypertension during pregnancy,  antepartum Delivery at 40 weeks BP controlled off medications  3. Short interval between pregnancies affecting pregnancy, antepartum  4. Hx of preeclampsia, prior pregnancy, currently pregnant BP controlled  5. Obesity affecting pregnancy in second trimester, unspecified obesity type   Preterm labor symptoms and general obstetric precautions including but not limited to vaginal bleeding, contractions, leaking of fluid and fetal movement were reviewed in detail with the patient. Please refer to After Visit Summary for other counseling recommendations.   No follow-ups on file.  Future Appointments  Date Time Provider Department Center  07/29/2023  1:10 PM Levie Heritage, DO [facility] None  08/10/2023  3:10 PM Gerrit Heck, CNM [facility] None  08/19/2023  2:30 PM Levie Heritage, DO [facility] None    Levie Heritage, DO

## 2023-07-26 LAB — GC/CHLAMYDIA PROBE AMP (~~LOC~~) NOT AT ARMC
Chlamydia: NEGATIVE
Comment: NEGATIVE
Comment: NORMAL
Neisseria Gonorrhea: NEGATIVE

## 2023-07-26 LAB — CULTURE, BETA STREP (GROUP B ONLY): Strep Gp B Culture: NEGATIVE

## 2023-07-29 ENCOUNTER — Other Ambulatory Visit: Payer: Self-pay

## 2023-07-29 ENCOUNTER — Encounter: Payer: Medicaid Other | Admitting: Family Medicine

## 2023-07-29 ENCOUNTER — Encounter (HOSPITAL_COMMUNITY): Payer: Self-pay | Admitting: Obstetrics & Gynecology

## 2023-07-29 ENCOUNTER — Inpatient Hospital Stay (HOSPITAL_COMMUNITY)
Admission: AD | Admit: 2023-07-29 | Discharge: 2023-07-30 | DRG: 807 | Disposition: A | Discharge status: Home / Self Care | Payer: Medicaid Other | Attending: Obstetrics & Gynecology | Admitting: Obstetrics & Gynecology

## 2023-07-29 DIAGNOSIS — O26893 Other specified pregnancy related conditions, third trimester: Secondary | ICD-10-CM | POA: Diagnosis present

## 2023-07-29 DIAGNOSIS — Z8249 Family history of ischemic heart disease and other diseases of the circulatory system: Secondary | ICD-10-CM | POA: Diagnosis not present

## 2023-07-29 DIAGNOSIS — Z3A37 37 weeks gestation of pregnancy: Secondary | ICD-10-CM

## 2023-07-29 DIAGNOSIS — O1002 Pre-existing essential hypertension complicating childbirth: Secondary | ICD-10-CM | POA: Diagnosis not present

## 2023-07-29 DIAGNOSIS — Z833 Family history of diabetes mellitus: Secondary | ICD-10-CM

## 2023-07-29 DIAGNOSIS — O09899 Supervision of other high risk pregnancies, unspecified trimester: Secondary | ICD-10-CM

## 2023-07-29 DIAGNOSIS — O10919 Unspecified pre-existing hypertension complicating pregnancy, unspecified trimester: Secondary | ICD-10-CM | POA: Diagnosis present

## 2023-07-29 DIAGNOSIS — Z2839 Other underimmunization status: Secondary | ICD-10-CM

## 2023-07-29 DIAGNOSIS — Z87891 Personal history of nicotine dependence: Secondary | ICD-10-CM

## 2023-07-29 DIAGNOSIS — Z91013 Allergy to seafood: Secondary | ICD-10-CM

## 2023-07-29 DIAGNOSIS — O1092 Unspecified pre-existing hypertension complicating childbirth: Principal | ICD-10-CM | POA: Diagnosis present

## 2023-07-29 LAB — COMPREHENSIVE METABOLIC PANEL
ALT: 10 U/L (ref 0–44)
AST: 19 U/L (ref 15–41)
Albumin: 3 g/dL — ABNORMAL LOW (ref 3.5–5.0)
Alkaline Phosphatase: 199 U/L — ABNORMAL HIGH (ref 38–126)
Anion gap: 15 (ref 5–15)
BUN: 9 mg/dL (ref 6–20)
CO2: 17 mmol/L — ABNORMAL LOW (ref 22–32)
Calcium: 8.4 mg/dL — ABNORMAL LOW (ref 8.9–10.3)
Chloride: 99 mmol/L (ref 98–111)
Creatinine, Ser: 0.82 mg/dL (ref 0.44–1.00)
GFR, Estimated: >60 mL/min (ref >60)
Glucose, Bld: 137 mg/dL — ABNORMAL HIGH (ref 70–99)
Potassium: 3.3 mmol/L — ABNORMAL LOW (ref 3.5–5.1)
Sodium: 131 mmol/L — ABNORMAL LOW (ref 135–145)
Total Bilirubin: 0.7 mg/dL (ref <1.2)
Total Protein: 6.4 g/dL — ABNORMAL LOW (ref 6.5–8.1)

## 2023-07-29 LAB — POCT FERN TEST: POCT Fern Test: NEGATIVE

## 2023-07-29 LAB — URINALYSIS, ROUTINE W REFLEX MICROSCOPIC
Bilirubin Urine: NEGATIVE
Glucose, UA: NEGATIVE mg/dL
Hgb urine dipstick: NEGATIVE
Ketones, ur: 80 mg/dL — AB
Nitrite: NEGATIVE
Protein, ur: 100 mg/dL — AB
Specific Gravity, Urine: 1.023 (ref 1.005–1.030)
pH: 5 (ref 5.0–8.0)

## 2023-07-29 LAB — TYPE AND SCREEN
ABO/RH(D): A POS
Antibody Screen: NEGATIVE

## 2023-07-29 LAB — CBC
HCT: 35.8 % — ABNORMAL LOW (ref 36.0–46.0)
Hemoglobin: 11.8 g/dL — ABNORMAL LOW (ref 12.0–15.0)
MCH: 29 pg (ref 26.0–34.0)
MCHC: 33 g/dL (ref 30.0–36.0)
MCV: 88 fL (ref 80.0–100.0)
Platelets: 296 10*3/uL (ref 150–400)
RBC: 4.07 MIL/uL (ref 3.87–5.11)
RDW: 13 % (ref 11.5–15.5)
WBC: 13.4 10*3/uL — ABNORMAL HIGH (ref 4.0–10.5)
nRBC: 0 % (ref 0.0–0.2)

## 2023-07-29 LAB — RPR: RPR Ser Ql: NONREACTIVE

## 2023-07-29 MED ORDER — TETANUS-DIPHTH-ACELL PERTUSSIS 5-2.5-18.5 LF-MCG/0.5 IM SUSY
0.5000 mL | PREFILLED_SYRINGE | Freq: Once | INTRAMUSCULAR | Status: DC
  Filled 2023-07-29: qty 0.5

## 2023-07-29 MED ORDER — FUROSEMIDE 20 MG PO TABS
20.0000 mg | ORAL_TABLET | Freq: Every day | ORAL | Status: DC
  Administered 2023-07-29 – 2023-07-30 (×2): 20 mg via ORAL
  Filled 2023-07-29 (×2): qty 1

## 2023-07-29 MED ORDER — ONDANSETRON HCL 4 MG/2ML IJ SOLN
4.0000 mg | INTRAMUSCULAR | Status: DC | PRN
  Administered 2023-07-29: 4 mg via INTRAVENOUS
  Filled 2023-07-29: qty 2

## 2023-07-29 MED ORDER — LACTATED RINGERS IV SOLN: INTRAVENOUS | Status: DC

## 2023-07-29 MED ORDER — ACETAMINOPHEN 325 MG PO TABS: 650.0000 mg | ORAL_TABLET | ORAL | Status: DC | PRN

## 2023-07-29 MED ORDER — DIBUCAINE (PERIANAL) 1 % EX OINT: 1.0000 | TOPICAL_OINTMENT | CUTANEOUS | Status: DC | PRN

## 2023-07-29 MED ORDER — ONDANSETRON HCL 4 MG/2ML IJ SOLN: 4.0000 mg | Freq: Four times a day (QID) | INTRAMUSCULAR | Status: DC | PRN

## 2023-07-29 MED ORDER — OXYTOCIN-SODIUM CHLORIDE 30-0.9 UT/500ML-% IV SOLN
2.5000 [IU]/h | INTRAVENOUS | Status: DC
  Administered 2023-07-29: 2.5 [IU]/h via INTRAVENOUS
  Filled 2023-07-29: qty 500

## 2023-07-29 MED ORDER — OXYCODONE-ACETAMINOPHEN 5-325 MG PO TABS: 1.0000 | ORAL_TABLET | ORAL | Status: DC | PRN

## 2023-07-29 MED ORDER — LIDOCAINE HCL (PF) 1 % IJ SOLN: 30.0000 mL | INTRAMUSCULAR | Status: DC | PRN

## 2023-07-29 MED ORDER — MEASLES, MUMPS & RUBELLA VAC IJ SOLR
0.5000 mL | Freq: Once | INTRAMUSCULAR | Status: DC
  Filled 2023-07-29: qty 0.5

## 2023-07-29 MED ORDER — PRENATAL MULTIVITAMIN CH
1.0000 | ORAL_TABLET | Freq: Every day | ORAL | Status: DC
  Administered 2023-07-29 – 2023-07-30 (×2): 1 via ORAL
  Filled 2023-07-29 (×2): qty 1

## 2023-07-29 MED ORDER — SENNOSIDES-DOCUSATE SODIUM 8.6-50 MG PO TABS
2.0000 | ORAL_TABLET | ORAL | Status: DC
  Administered 2023-07-29 – 2023-07-30 (×2): 2 via ORAL
  Filled 2023-07-29 (×2): qty 2

## 2023-07-29 MED ORDER — OXYCODONE HCL 5 MG PO TABS: 5.0000 mg | ORAL_TABLET | ORAL | Status: DC | PRN

## 2023-07-29 MED ORDER — WITCH HAZEL-GLYCERIN EX PADS: 1.0000 | MEDICATED_PAD | CUTANEOUS | Status: DC | PRN

## 2023-07-29 MED ORDER — OXYCODONE-ACETAMINOPHEN 5-325 MG PO TABS: 2.0000 | ORAL_TABLET | ORAL | Status: DC | PRN

## 2023-07-29 MED ORDER — LACTATED RINGERS IV SOLN: 500.0000 mL | INTRAVENOUS | Status: DC | PRN

## 2023-07-29 MED ORDER — COCONUT OIL OIL: 1.0000 | TOPICAL_OIL | Status: DC | PRN

## 2023-07-29 MED ORDER — OXYTOCIN BOLUS FROM INFUSION
333.0000 mL | Freq: Once | INTRAVENOUS | Status: AC
  Administered 2023-07-29: 333 mL via INTRAVENOUS

## 2023-07-29 MED ORDER — ONDANSETRON HCL 4 MG PO TABS: 4.0000 mg | ORAL_TABLET | ORAL | Status: DC | PRN

## 2023-07-29 MED ORDER — BENZOCAINE-MENTHOL 20-0.5 % EX AERO: 1.0000 | INHALATION_SPRAY | CUTANEOUS | Status: DC | PRN

## 2023-07-29 MED ORDER — SOD CITRATE-CITRIC ACID 500-334 MG/5ML PO SOLN: 30.0000 mL | ORAL | Status: DC | PRN

## 2023-07-29 MED ORDER — ONDANSETRON 4 MG PO TBDP: 4.0000 mg | ORAL_TABLET | Freq: Once | ORAL | Status: DC

## 2023-07-29 MED ORDER — IBUPROFEN 600 MG PO TABS
600.0000 mg | ORAL_TABLET | Freq: Four times a day (QID) | ORAL | Status: DC
  Administered 2023-07-29 – 2023-07-30 (×5): 600 mg via ORAL
  Filled 2023-07-29 (×5): qty 1

## 2023-07-29 MED ORDER — ZOLPIDEM TARTRATE 5 MG PO TABS: 5.0000 mg | ORAL_TABLET | Freq: Every evening | ORAL | Status: DC | PRN

## 2023-07-29 MED ORDER — FENTANYL CITRATE (PF) 100 MCG/2ML IJ SOLN
100.0000 ug | INTRAMUSCULAR | Status: DC | PRN
  Filled 2023-07-29: qty 2

## 2023-07-29 MED ORDER — DIPHENHYDRAMINE HCL 25 MG PO CAPS: 25.0000 mg | ORAL_CAPSULE | Freq: Four times a day (QID) | ORAL | Status: DC | PRN

## 2023-07-29 MED ORDER — SIMETHICONE 80 MG PO CHEW: 80.0000 mg | CHEWABLE_TABLET | ORAL | Status: DC | PRN

## 2023-07-29 NOTE — H&P (Signed)
Diana Fuentes is a 32 y.o. G17P3003 female at [redacted]w[redacted]d by LMP c/w [redacted]w[redacted]d u/s presenting with reg ctx and SOL.   Reports active fetal movement, contractions: regular, every 2-3 minutes, vaginal bleeding: scant staining, membranes: intact.  Initiated prenatal care at [facility] at [redacted]w[redacted]d wks.   Most recent u/s: [redacted]w[redacted]d, EFW 2869g, cephalic, fundal placenta, AFI 15cm.   This pregnancy complicated by: # cHTN (no meds) # hx pre-e # short preg interval (last delivery Oct 2023)  Prenatal History/Complications:  # term vag deliveries x 3 (2011, 2022, 2023) # hx IUGR (2011)  Past Medical History: Past Medical History:  Diagnosis Date   Adrenal adenoma    Anxiety    Chlamydia    Depression    Ear infection     Past Surgical History: Past Surgical History:  Procedure Laterality Date   LAPAROSCOPIC LYSIS OF ADHESIONS N/A 11/02/2018   Procedure: LAPAROSCOPIC LYSIS OF ADHESIONS;  Surgeon: Allie Bossier, MD;  Location: [facility];  Service: Gynecology;  Laterality: N/A;   LAPAROSCOPY N/A 11/02/2018   Procedure: LAPAROSCOPY DIAGNOSTIC;  Surgeon: Allie Bossier, MD;  Location:  SURGERY CENTER;  Service: Gynecology;  Laterality: N/A;   WISDOM TOOTH EXTRACTION      Obstetrical History: OB History     Gravida  4   Para  3   Term  3   Preterm      AB      Living  3      SAB      IAB      Ectopic      Multiple  0   Live Births  3           Social History: Social History   Socioeconomic History   Marital status: Divorced    Spouse name: Not on file   Number of children: 1   Years of education: Not on file   Highest education level: High school graduate  Occupational History   Not on file  Tobacco Use   Smoking status: Former   Smokeless tobacco: Current   Tobacco comments:    vape  Vaping Use   Vaping status: Former   Substances: Flavoring  Substance and Sexual Activity   Alcohol use: Not Currently    Comment: occ   Drug use: Not Currently     Types: Marijuana    Comment: last used Mar 2024   Sexual activity: Not Currently    Birth control/protection: None  Other Topics Concern   Not on file  Social History Narrative   Not on file   Social Determinants of Health   Financial Resource Strain: Not on file  Food Insecurity: No Food Insecurity (06/22/2022)   Hunger Vital Sign    Worried About Running Out of Food in the Last Year: Never true    Ran Out of Food in the Last Year: Never true  Transportation Needs: No Transportation Needs (06/22/2022)   PRAPARE - Administrator, Civil Service (Medical): No    Lack of Transportation (Non-Medical): No  Physical Activity: Not on file  Stress: Not on file  Social Connections: Not on file    Family History: Family History  Problem Relation Age of Onset   Hypertension Father    Diabetes Maternal Aunt    Diabetes Maternal Uncle    Diabetes Maternal Grandmother     Allergies: Allergies  Allergen Reactions   Shellfish Allergy Rash and Shortness Of Breath    Medications  Prior to Admission  Medication Sig Dispense Refill Last Dose   Prenatal Vit-Fe Fumarate-FA (PRENATAL VITAMINS PO) Take by mouth.   07/28/2023   aspirin EC 81 MG tablet Take 1 tablet (81 mg total) by mouth daily. Swallow whole. (Patient not taking: Reported on 07/08/2023) 30 tablet 12    famotidine (PEPCID) 20 MG tablet Take 1 tablet (20 mg total) by mouth 2 (two) times daily. (Patient not taking: Reported on 07/08/2023) 60 tablet 0    pantoprazole (PROTONIX) 40 MG tablet Take 1 tablet (40 mg total) by mouth daily. (Patient not taking: Reported on 07/08/2023) 30 tablet 5    promethazine (PHENERGAN) 25 MG tablet Take 1 tablet (25 mg total) by mouth every 6 (six) hours as needed for nausea or vomiting. (Patient not taking: Reported on 07/08/2023) 60 tablet 0    scopolamine (TRANSDERM-SCOP) 1 MG/3DAYS Place 1 patch (1.5 mg total) onto the skin every 3 (three) days. (Patient not taking: Reported on  07/08/2023) 10 patch 12     Review of Systems  Pertinent pos/neg as indicated in HPI  Blood pressure 128/61, pulse 62, temperature 98.7 F (37.1 C), temperature source Oral, resp. rate 20, height 5\' 6"  (1.676 m), weight 90.1 kg, last menstrual period 11/08/2022, SpO2 98%, unknown if currently breastfeeding. General appearance: alert and mild distress Lungs: clear to auscultation bilaterally Heart: regular rate and rhythm Abdomen: gravid, soft, non-tender, EFW by Leopold's approximately 7lbs Extremities: 1+ edema  Fetal monitoring: FHR: 140s bpm, variability: moderate,  Accelerations: Present,  decelerations:  Absent (occ early variables) Uterine activity: Frequency: Every 2-3 minutes Dilation: 8.5 Effacement (%): 90 Station: Plus 1 Exam by:: Veneta Penton, RN Presentation: cephalic   Prenatal labs: ABO, Rh: --/--/PENDING (11/14 0127) Antibody: PENDING (11/14 0127) Rubella: <0.90 (06/04 1014) RPR: Non Reactive (10/03 0842)  HBsAg: Negative (06/04 1014)  HIV: Non Reactive (10/03 0842)  GBS: Negative/-- (11/07 1508)  2hr GTT: 74/110/94  Prenatal Transfer Tool  Maternal Diabetes: No Genetic Screening: Normal Maternal Ultrasounds/Referrals: Normal Fetal Ultrasounds or other Referrals:  None Maternal Substance Abuse:  No Significant Maternal Medications:  None Significant Maternal Lab Results: Group B Strep negative  Results for orders placed or performed during the hospital encounter of 07/29/23 (from the past 24 hour(s))  Urinalysis, Routine w reflex microscopic -Urine, Clean Catch   Collection Time: 07/29/23  1:00 AM  Result Value Ref Range   Color, Urine YELLOW YELLOW   APPearance CLOUDY (A) CLEAR   Specific Gravity, Urine 1.023 1.005 - 1.030   pH 5.0 5.0 - 8.0   Glucose, UA NEGATIVE NEGATIVE mg/dL   Hgb urine dipstick NEGATIVE NEGATIVE   Bilirubin Urine NEGATIVE NEGATIVE   Ketones, ur 80 (A) NEGATIVE mg/dL   Protein, ur 540 (A) NEGATIVE mg/dL   Nitrite  NEGATIVE NEGATIVE   Leukocytes,Ua SMALL (A) NEGATIVE   RBC / HPF 0-5 0 - 5 RBC/hpf   WBC, UA 6-10 0 - 5 WBC/hpf   Bacteria, UA FEW (A) NONE SEEN   Squamous Epithelial / HPF 21-50 0 - 5 /HPF   Mucus PRESENT   Fern Test   Collection Time: 07/29/23  1:09 AM  Result Value Ref Range   POCT Fern Test Negative = intact amniotic membranes   CBC   Collection Time: 07/29/23  1:27 AM  Result Value Ref Range   WBC 13.4 (H) 4.0 - 10.5 K/uL   RBC 4.07 3.87 - 5.11 MIL/uL   Hemoglobin 11.8 (L) 12.0 - 15.0 g/dL   HCT 98.1 (L)  36.0 - 46.0 %   MCV 88.0 80.0 - 100.0 fL   MCH 29.0 26.0 - 34.0 pg   MCHC 33.0 30.0 - 36.0 g/dL   RDW 19.1 47.8 - 29.5 %   Platelets 296 150 - 400 K/uL   nRBC 0.0 0.0 - 0.2 %  Comprehensive metabolic panel   Collection Time: 07/29/23  1:27 AM  Result Value Ref Range   Sodium 131 (L) 135 - 145 mmol/L   Potassium 3.3 (L) 3.5 - 5.1 mmol/L   Chloride 99 98 - 111 mmol/L   CO2 17 (L) 22 - 32 mmol/L   Glucose, Bld 137 (H) 70 - 99 mg/dL   BUN 9 6 - 20 mg/dL   Creatinine, Ser 6.21 0.44 - 1.00 mg/dL   Calcium 8.4 (L) 8.9 - 10.3 mg/dL   Total Protein 6.4 (L) 6.5 - 8.1 g/dL   Albumin 3.0 (L) 3.5 - 5.0 g/dL   AST 19 15 - 41 U/L   ALT 10 0 - 44 U/L   Alkaline Phosphatase 199 (H) 38 - 126 U/L   Total Bilirubin 0.7 <1.2 mg/dL   GFR, Estimated >30 >86 mL/min   Anion gap 15 5 - 15  Type and screen [facility]   Collection Time: 07/29/23  1:27 AM  Result Value Ref Range   ABO/RH(D) PENDING    Antibody Screen PENDING    Sample Expiration      08/01/2023,2359 Performed at [facility], [address], [place], [place] [address]      Assessment:  [redacted]w[redacted]d SIUP  [id number]  cHTN  SOL  Cat 2 FHR  GBS Negative/-- (11/07 1508)  Plan:  Admit to L&D  IV pain meds/epidural prn active labor  Expectant management  Anticipate vag delivery   Plans to breastfeed  Contraception: unsure  Circumcision: no  Arabella Merles CNM 07/29/2023, 2:12 AM

## 2023-07-29 NOTE — MAU Note (Signed)
.  Diana Fuentes is a 32 y.o. at [redacted]w[redacted]d here in MAU reporting: ctx every 5 minutes - was mild early this morning, but have been more intense over the past few hours. N/V all day - unable to keep anything down. Unsure if water broke - felt a trickle while vomiting. "feels like regular discharge". Denies VB. Reports DFM - last movement felt a few hours ago.   Pain score: 7 Vitals:   07/29/23 0050  BP: (!) 142/92  Pulse: 69  Resp: 20  Temp: 98.1 F (36.7 C)  SpO2: 98%     FHT:150 Lab orders placed from triage:  UA

## 2023-07-29 NOTE — Progress Notes (Deleted)
En caul

## 2023-07-29 NOTE — Discharge Summary (Signed)
Postpartum Discharge Summary     Patient Name: Diana Fuentes DOB: 1991/08/14 MRN: [id number]  Date of admission: 07/29/2023 Delivery date:07/29/2023 Delivering provider: Cam Hai D Date of discharge: 07/30/2023  Admitting diagnosis: Labor and delivery, indication for care [O75.9] Intrauterine pregnancy: [redacted]w[redacted]d     Secondary diagnosis:  Principal Problem:   Labor and delivery, indication for care Active Problems:   Rubella non-immune status, antepartum   Chronic hypertension during pregnancy, antepartum  Additional problems: none    Discharge diagnosis: Term Pregnancy Delivered and CHTN                                              Post partum procedures: none Augmentation:  none Complications: None  Hospital course: Onset of Labor With Vaginal Delivery      32 y.o. yo [id number] at [redacted]w[redacted]d was admitted in Latent Labor on 07/29/2023. Labor course was uncomplicated. Membrane Rupture Time/Date:  ,   Delivery Method:Vaginal, Spontaneous Operative Delivery:N/A Episiotomy: None Lacerations:  None Patient had a postpartum course remarkable for taking Lasix 20mg  x 5d; her BPs remained normotensive and she will be discharge on oral lasix. Her chronic hypertension was diagnosed early in pregnancy during a time of significant nausea/vomiting and bps well controlled during pregnancy not on meds so not clear if there is true chronic hypertension. Will plan on a 1 week bp check and if bp normal can continue to monitor off meds. She signed her tubal papers late so we will arrange for outpt tubal appointment.  She is ambulating, tolerating a regular diet, passing flatus, and urinating well. Patient is discharged home in stable condition on 07/30/23.  Newborn Data: Birth date:07/29/2023 Birth time:4:13 AM Gender:Female Living status:Living Apgars:9 ,9  Weight:3550 g (7lb 13.2oz)  Magnesium Sulfate received: No BMZ received: No Rhophylac:N/A MMR:(ordered PP) T-DaP: declined  prenatally Flu: No Transfusion:No  Immunizations received: There is no immunization history for the selected administration types on file for this patient.  Physical exam  Vitals:   07/29/23 1146 07/29/23 1436 07/29/23 1933 07/30/23 0457  BP: 122/76 124/85 128/81 103/66  Pulse: (!) 57 85 65 65  Resp: 16 16 18 16   Temp: 98 F (36.7 C) 98.7 F (37.1 C) 98.4 F (36.9 C) 98.4 F (36.9 C)  TempSrc: Oral Oral Oral Oral  SpO2:   99% 98%  Weight:      Height:       General: alert, cooperative, and no distress Lochia: appropriate Uterine Fundus: firm Incision: N/A DVT Evaluation: No evidence of DVT seen on physical exam. Labs: Lab Results  Component Value Date   WBC 13.4 (H) 07/29/2023   HGB 11.8 (L) 07/29/2023   HCT 35.8 (L) 07/29/2023   MCV 88.0 07/29/2023   PLT 296 07/29/2023      Latest Ref Rng & Units 07/29/2023    1:27 AM  CMP  Glucose 70 - 99 mg/dL 244   BUN 6 - 20 mg/dL 9   Creatinine 0.10 - 2.72 mg/dL 5.36   Sodium 644 - 034 mmol/L 131   Potassium 3.5 - 5.1 mmol/L 3.3   Chloride 98 - 111 mmol/L 99   CO2 22 - 32 mmol/L 17   Calcium 8.9 - 10.3 mg/dL 8.4   Total Protein 6.5 - 8.1 g/dL 6.4   Total Bilirubin <7.4 mg/dL 0.7   Alkaline Phos 38 - 126 U/L  199   AST 15 - 41 U/L 19   ALT 0 - 44 U/L 10    Edinburgh Score:    07/29/2023    6:05 AM  Edinburgh Postnatal Depression Scale Screening Tool  I have been able to laugh and see the funny side of things. 0  I have looked forward with enjoyment to things. 0  I have blamed myself unnecessarily when things went wrong. 0  I have been anxious or worried for no good reason. 0  I have felt scared or panicky for no good reason. 0  Things have been getting on top of me. 1  I have been so unhappy that I have had difficulty sleeping. 0  I have felt sad or miserable. 0  I have been so unhappy that I have been crying. 1  The thought of harming myself has occurred to me. 0  Edinburgh Postnatal Depression Scale Total 2    Edinburgh Postnatal Depression Scale Total: 2   After visit meds:  Allergies as of 07/30/2023       Reactions   Shellfish Allergy Rash, Shortness Of Breath        Medication List     STOP taking these medications    aspirin EC 81 MG tablet   famotidine 20 MG tablet Commonly known as: PEPCID   pantoprazole 40 MG tablet Commonly known as: Protonix   promethazine 25 MG tablet Commonly known as: PHENERGAN   scopolamine 1 MG/3DAYS Commonly known as: TRANSDERM-SCOP       TAKE these medications    acetaminophen 325 MG tablet Commonly known as: Tylenol Take 2 tablets (650 mg total) by mouth every 4 (four) hours as needed (for pain scale < 4).   furosemide 20 MG tablet Commonly known as: LASIX Take 1 tablet (20 mg total) by mouth daily.   ibuprofen 600 MG tablet Commonly known as: ADVIL Take 1 tablet (600 mg total) by mouth every 6 (six) hours.   PRENATAL VITAMINS PO Take by mouth.         Discharge home in stable condition Infant Feeding: Breast Infant Disposition:home with mother Discharge instruction: per After Visit Summary and Postpartum booklet. Activity: Advance as tolerated. Pelvic rest for 6 weeks.  Diet: routine diet Future Appointments: Future Appointments  Date Time Provider Department Center  08/04/2023  2:30 PM [facility] NURSE [facility] None  09/09/2023  2:30 PM Levie Heritage, DO [facility] None   Follow up Visit:  Follow-up Information     Your OB/GYN office Follow up.   Why: in about one week for a blood pressure check, in about 6 weeks for a postpartum visit, and also for a tubal ligation planning visit                Arabella Merles, CNM  P [facility] Please schedule this patient for Postpartum visit in: 1-2wks with the following provider: MD (for BP check and to schedule BTL) In-Person For C/S patients schedule nurse incision check in weeks 2 weeks: no High risk pregnancy complicated by: cHTN Delivery mode:   SVD Anticipated Birth Control:  Plans Interval BTL PP Procedures needed: BP check Schedule Integrated BH visit: no  07/30/2023 Silvano Bilis, MD

## 2023-07-29 NOTE — Lactation Note (Signed)
This note was copied from a baby's chart. Lactation Consultation Note  Patient Name: Diana Fuentes [id number] Date: 07/29/2023 Age:31 hours Reason for consult: Initial assessment;Early term 37-38.6wks;Breastfeeding assistance Per mom the last time baby fed was at 1245 for 10 mins with swallows.  LC offered to check the diaper, it was dry, and baby spit up a small amount of mucous. LC assisted with pillow support otherwise mom independent with attempting to latch. Baby sleepy. Baby STS after attempt and mom aware to call with feeding cues for Latch assessment.  LC offered mom a hand pump for D/C tomorrow and checked the flange #18 F was the better fit and per mom #21 F was comfortable too.    Maternal Data Does the patient have breastfeeding experience prior to this delivery?: Yes  Feeding Mother's Current Feeding Choice: Breast Milk  LATCH Score Latch: Too sleepy or reluctant, no latch achieved, no sucking elicited.  Audible Swallowing: None  Type of Nipple: Everted at rest and after stimulation  Comfort (Breast/Nipple): Soft / non-tender  Hold (Positioning): Assistance needed to correctly position infant at breast and maintain latch.  LATCH Score: 5   Lactation Tools Discussed/Used Tools: Pump;Flanges Breast pump type: Manual Pump Education: Milk Storage;Setup, frequency, and cleaning  Interventions Interventions: Breast feeding basics reviewed;Hand pump;Education;LC Services brochure  Discharge Pump: Manual;Personal WIC Program: No DEBP - [place]   Consult Status Consult Status: Follow-up Date: 07/29/23 Follow-up type: In-patient    Diana Fuentes 07/29/2023, 3:07 PM

## 2023-07-30 ENCOUNTER — Other Ambulatory Visit (HOSPITAL_COMMUNITY): Payer: Self-pay

## 2023-07-30 MED ORDER — FUROSEMIDE 20 MG PO TABS
20.0000 mg | ORAL_TABLET | Freq: Every day | ORAL | 0 refills | Status: AC
  Filled 2023-07-30: qty 4, 4d supply, fill #0

## 2023-07-30 MED ORDER — ACETAMINOPHEN 325 MG PO TABS
650.0000 mg | ORAL_TABLET | ORAL | 1 refills | Status: AC | PRN
  Filled 2023-07-30: qty 60, 5d supply, fill #0

## 2023-07-30 MED ORDER — IBUPROFEN 600 MG PO TABS
600.0000 mg | ORAL_TABLET | Freq: Four times a day (QID) | ORAL | 1 refills | Status: AC
  Filled 2023-07-30: qty 60, 15d supply, fill #0

## 2023-07-30 NOTE — Social Work (Signed)
[name] was referred for history of depression/anxiety.  * Referral screened out by Clinical Social Worker because none of the following criteria appear to apply:  ~ History of anxiety/depression during this pregnancy, or of post-partum depression following prior delivery.  ~ Diagnosis of anxiety and/or depression within last 3 years OR * [name]'s symptoms currently being treated with medication and/or therapy.  Per chart review, [name] was diagnosed in 2015, no concerns noted during this pregnancy, EPDS =2.  Please contact the Clinical Social Worker if needs arise, or by [name] request.   Diana Fuentes, LCSWA Clinical Social Worker [phone]

## 2023-07-30 NOTE — Plan of Care (Signed)
Problem: Education: Goal: Knowledge of General Education information will improve Description: Including pain rating scale, medication(s)/side effects and non-pharmacologic comfort measures 07/30/2023 1008 by Donne Hazel, LPN Outcome: Adequate for Discharge 07/30/2023 0856 by Donne Hazel, LPN Outcome: Progressing   Problem: Health Behavior/Discharge Planning: Goal: Ability to manage health-related needs will improve 07/30/2023 1008 by Donne Hazel, LPN Outcome: Adequate for Discharge 07/30/2023 0856 by Donne Hazel, LPN Outcome: Progressing   Problem: Clinical Measurements: Goal: Ability to maintain clinical measurements within normal limits will improve 07/30/2023 1008 by Donne Hazel, LPN Outcome: Adequate for Discharge 07/30/2023 0856 by Donne Hazel, LPN Outcome: Progressing Goal: Will remain free from infection 07/30/2023 1008 by Donne Hazel, LPN Outcome: Adequate for Discharge 07/30/2023 0856 by Donne Hazel, LPN Outcome: Progressing Goal: Diagnostic test results will improve 07/30/2023 1008 by Donne Hazel, LPN Outcome: Adequate for Discharge 07/30/2023 0856 by Donne Hazel, LPN Outcome: Progressing Goal: Respiratory complications will improve 07/30/2023 1008 by Donne Hazel, LPN Outcome: Adequate for Discharge 07/30/2023 0856 by Donne Hazel, LPN Outcome: Progressing Goal: Cardiovascular complication will be avoided 07/30/2023 1008 by Donne Hazel, LPN Outcome: Adequate for Discharge 07/30/2023 0856 by Donne Hazel, LPN Outcome: Progressing   Problem: Activity: Goal: Risk for activity intolerance will decrease 07/30/2023 1008 by Donne Hazel, LPN Outcome: Adequate for Discharge 07/30/2023 0856 by Donne Hazel, LPN Outcome: Progressing   Problem: Nutrition: Goal: Adequate nutrition will be maintained 07/30/2023 1008 by Donne Hazel, LPN Outcome: Adequate for Discharge 07/30/2023 0856 by Donne Hazel, LPN Outcome:  Progressing   Problem: Coping: Goal: Level of anxiety will decrease 07/30/2023 1008 by Donne Hazel, LPN Outcome: Adequate for Discharge 07/30/2023 0856 by Donne Hazel, LPN Outcome: Progressing   Problem: Elimination: Goal: Will not experience complications related to bowel motility 07/30/2023 1008 by Donne Hazel, LPN Outcome: Adequate for Discharge 07/30/2023 0856 by Donne Hazel, LPN Outcome: Progressing Goal: Will not experience complications related to urinary retention 07/30/2023 1008 by Donne Hazel, LPN Outcome: Adequate for Discharge 07/30/2023 0856 by Donne Hazel, LPN Outcome: Progressing   Problem: Pain Management: Goal: General experience of comfort will improve 07/30/2023 1008 by Donne Hazel, LPN Outcome: Adequate for Discharge 07/30/2023 0856 by Donne Hazel, LPN Outcome: Progressing   Problem: Safety: Goal: Ability to remain free from injury will improve 07/30/2023 1008 by Donne Hazel, LPN Outcome: Adequate for Discharge 07/30/2023 0856 by Donne Hazel, LPN Outcome: Progressing   Problem: Skin Integrity: Goal: Risk for impaired skin integrity will decrease 07/30/2023 1008 by Donne Hazel, LPN Outcome: Adequate for Discharge 07/30/2023 0856 by Donne Hazel, LPN Outcome: Progressing   Problem: Education: Goal: Knowledge of Childbirth will improve 07/30/2023 1008 by Donne Hazel, LPN Outcome: Adequate for Discharge 07/30/2023 0856 by Donne Hazel, LPN Outcome: Progressing Goal: Ability to make informed decisions regarding treatment and plan of care will improve 07/30/2023 1008 by Donne Hazel, LPN Outcome: Adequate for Discharge 07/30/2023 0856 by Donne Hazel, LPN Outcome: Progressing Goal: Ability to state and carry out methods to decrease the pain will improve 07/30/2023 1008 by Donne Hazel, LPN Outcome: Adequate for Discharge 07/30/2023 0856 by Donne Hazel, LPN Outcome: Progressing Goal: Individualized  Educational Video(s) 07/30/2023 1008 by Donne Hazel, LPN Outcome: Adequate for Discharge 07/30/2023 0856 by Donne Hazel, LPN Outcome: Progressing   Problem: Coping: Goal: Ability to verbalize concerns and feelings  about labor and delivery will improve 07/30/2023 1008 by Donne Hazel, LPN Outcome: Adequate for Discharge 07/30/2023 0856 by Donne Hazel, LPN Outcome: Progressing   Problem: Life Cycle: Goal: Ability to make normal progression through stages of labor will improve 07/30/2023 1008 by Donne Hazel, LPN Outcome: Adequate for Discharge 07/30/2023 0856 by Donne Hazel, LPN Outcome: Progressing Goal: Ability to effectively push during vaginal delivery will improve 07/30/2023 1008 by Donne Hazel, LPN Outcome: Adequate for Discharge 07/30/2023 0856 by Donne Hazel, LPN Outcome: Progressing   Problem: Role Relationship: Goal: Will demonstrate positive interactions with the child 07/30/2023 1008 by Donne Hazel, LPN Outcome: Adequate for Discharge 07/30/2023 0856 by Donne Hazel, LPN Outcome: Progressing   Problem: Safety: Goal: Risk of complications during labor and delivery will decrease 07/30/2023 1008 by Donne Hazel, LPN Outcome: Adequate for Discharge 07/30/2023 0856 by Donne Hazel, LPN Outcome: Progressing   Problem: Pain Management: Goal: Relief or control of pain from uterine contractions will improve 07/30/2023 1008 by Donne Hazel, LPN Outcome: Adequate for Discharge 07/30/2023 0856 by Donne Hazel, LPN Outcome: Progressing   Problem: Education: Goal: Knowledge of condition will improve 07/30/2023 1008 by Donne Hazel, LPN Outcome: Adequate for Discharge 07/30/2023 0856 by Donne Hazel, LPN Outcome: Progressing Goal: Individualized Educational Video(s) 07/30/2023 1008 by Donne Hazel, LPN Outcome: Adequate for Discharge 07/30/2023 0856 by Donne Hazel, LPN Outcome: Progressing Goal: Individualized Newborn  Educational Video(s) 07/30/2023 1008 by Donne Hazel, LPN Outcome: Adequate for Discharge 07/30/2023 0856 by Donne Hazel, LPN Outcome: Progressing   Problem: Activity: Goal: Will verbalize the importance of balancing activity with adequate rest periods 07/30/2023 1008 by Donne Hazel, LPN Outcome: Adequate for Discharge 07/30/2023 0856 by Donne Hazel, LPN Outcome: Progressing Goal: Ability to tolerate increased activity will improve 07/30/2023 1008 by Donne Hazel, LPN Outcome: Adequate for Discharge 07/30/2023 0856 by Donne Hazel, LPN Outcome: Progressing   Problem: Coping: Goal: Ability to identify and utilize available resources and services will improve 07/30/2023 1008 by Donne Hazel, LPN Outcome: Adequate for Discharge 07/30/2023 0856 by Donne Hazel, LPN Outcome: Progressing   Problem: Life Cycle: Goal: Chance of risk for complications during the postpartum period will decrease 07/30/2023 1008 by Donne Hazel, LPN Outcome: Adequate for Discharge 07/30/2023 0856 by Donne Hazel, LPN Outcome: Progressing   Problem: Role Relationship: Goal: Ability to demonstrate positive interaction with newborn will improve 07/30/2023 1008 by Donne Hazel, LPN Outcome: Adequate for Discharge 07/30/2023 0856 by Donne Hazel, LPN Outcome: Progressing   Problem: Skin Integrity: Goal: Demonstration of wound healing without infection will improve 07/30/2023 1008 by Donne Hazel, LPN Outcome: Adequate for Discharge 07/30/2023 0856 by Donne Hazel, LPN Outcome: Progressing

## 2023-07-30 NOTE — Lactation Note (Addendum)
This note was copied from a baby's chart. Lactation Consultation Note  Patient Name: Diana Fuentes Fegely [id number] Date: 07/30/2023 Age:30 hours  Reason for consult: Follow-up assessment;Early term 66-38.6wks  Mother receptive to [facility] visit and talkative. She reports baby is feeding well and she can express colostrum. She has breast fed her other infant's. Currently baby is sleepy after he had extra digit removal. Mother is wanting to go home today. Infant has mostly had 5 minute feedings and pediatrician would like for LC to observe that baby is breastfeeding well with swallows, before writing an early discharge.   Mother politely said that she would prefer to breastfeed privately. Discussed that I support her desire for privacy and my name left on the dry erase board for mother to call, if she would like to. MD present in room.    LC will follow if mother and baby are not discharged.   Maternal Data Has patient been taught Hand Expression?: No How long did the patient breastfeed?: 4 month to 1 year with last 2 children  Feeding Mother's Current Feeding Choice: Breast Milk  LATCH Score  Not observed    Discharge Discharge Education: Engorgement and breast care Pump: Personal;Manual;DEBP  Consult Status Consult Status: Follow-up Date: 07/31/23 Follow-up type: In-patient    Christella Hartigan M 07/30/2023, 3:12 PM

## 2023-07-30 NOTE — Discharge Instructions (Signed)
WHAT TO LOOK OUT FOR: Fever of 100.4 or above Mastitis: feels like flu and breasts hurt Infection: increased pain, swelling or redness Blood clots golf ball size or larger Postpartum depression   Congratulations on your newest addition!

## 2023-07-30 NOTE — Plan of Care (Signed)
  Problem: Education: Goal: Knowledge of General Education information will improve Description: Including pain rating scale, medication(s)/side effects and non-pharmacologic comfort measures Outcome: Progressing   Problem: Health Behavior/Discharge Planning: Goal: Ability to manage health-related needs will improve Outcome: Progressing   Problem: Clinical Measurements: Goal: Ability to maintain clinical measurements within normal limits will improve Outcome: Progressing Goal: Will remain free from infection Outcome: Progressing Goal: Diagnostic test results will improve Outcome: Progressing Goal: Respiratory complications will improve Outcome: Progressing Goal: Cardiovascular complication will be avoided Outcome: Progressing   Problem: Activity: Goal: Risk for activity intolerance will decrease Outcome: Progressing   Problem: Nutrition: Goal: Adequate nutrition will be maintained Outcome: Progressing   Problem: Coping: Goal: Level of anxiety will decrease Outcome: Progressing   Problem: Elimination: Goal: Will not experience complications related to bowel motility Outcome: Progressing Goal: Will not experience complications related to urinary retention Outcome: Progressing   Problem: Pain Management: Goal: General experience of comfort will improve Outcome: Progressing   Problem: Safety: Goal: Ability to remain free from injury will improve Outcome: Progressing   Problem: Skin Integrity: Goal: Risk for impaired skin integrity will decrease Outcome: Progressing   Problem: Education: Goal: Knowledge of Childbirth will improve Outcome: Progressing Goal: Ability to make informed decisions regarding treatment and plan of care will improve Outcome: Progressing Goal: Ability to state and carry out methods to decrease the pain will improve Outcome: Progressing Goal: Individualized Educational Video(s) Outcome: Progressing   Problem: Coping: Goal: Ability to  verbalize concerns and feelings about labor and delivery will improve Outcome: Progressing   Problem: Life Cycle: Goal: Ability to make normal progression through stages of labor will improve Outcome: Progressing Goal: Ability to effectively push during vaginal delivery will improve Outcome: Progressing   Problem: Role Relationship: Goal: Will demonstrate positive interactions with the child Outcome: Progressing   Problem: Safety: Goal: Risk of complications during labor and delivery will decrease Outcome: Progressing   Problem: Pain Management: Goal: Relief or control of pain from uterine contractions will improve Outcome: Progressing   Problem: Education: Goal: Knowledge of condition will improve Outcome: Progressing Goal: Individualized Educational Video(s) Outcome: Progressing Goal: Individualized Newborn Educational Video(s) Outcome: Progressing   Problem: Activity: Goal: Will verbalize the importance of balancing activity with adequate rest periods Outcome: Progressing Goal: Ability to tolerate increased activity will improve Outcome: Progressing   Problem: Coping: Goal: Ability to identify and utilize available resources and services will improve Outcome: Progressing   Problem: Life Cycle: Goal: Chance of risk for complications during the postpartum period will decrease Outcome: Progressing   Problem: Role Relationship: Goal: Ability to demonstrate positive interaction with newborn will improve Outcome: Progressing   Problem: Skin Integrity: Goal: Demonstration of wound healing without infection will improve Outcome: Progressing

## 2023-08-04 ENCOUNTER — Ambulatory Visit: Payer: Medicaid Other

## 2023-08-10 ENCOUNTER — Telehealth (HOSPITAL_COMMUNITY): Payer: Self-pay | Admitting: *Deleted

## 2023-08-10 NOTE — Telephone Encounter (Signed)
08/10/2023  Name: Diana Fuentes MRN: [id number] DOB: [birth year]  Reason for Call:  Transition of Care Hospital Discharge Call  Contact Status: Patient Contact Status: Message  Language assistant needed:          Follow-Up Questions:    Inocente Salles Postnatal Depression Scale:  In the Past 7 Days:    PHQ2-9 Depression Scale:     Discharge Follow-up:    Post-discharge interventions: NA  Salena Saner, RN 08/10/2023 10:48

## 2023-08-19 ENCOUNTER — Encounter: Payer: Medicaid Other | Admitting: Family Medicine

## 2023-09-09 ENCOUNTER — Ambulatory Visit: Payer: Medicaid Other | Admitting: Family Medicine

## 2023-10-04 ENCOUNTER — Other Ambulatory Visit (HOSPITAL_COMMUNITY)
Admission: RE | Admit: 2023-10-04 | Discharge: 2023-10-04 | Disposition: A | Discharge status: Home / Self Care | Payer: Medicaid Other | Source: Ambulatory Visit | Attending: Family Medicine | Admitting: Family Medicine

## 2023-10-04 ENCOUNTER — Ambulatory Visit: Payer: Medicaid Other

## 2023-10-04 VITALS — BP 126/69 | HR 88 | Ht 66.0 in | Wt 190.0 lb

## 2023-10-04 DIAGNOSIS — N898 Other specified noninflammatory disorders of vagina: Secondary | ICD-10-CM | POA: Insufficient documentation

## 2023-10-04 NOTE — Progress Notes (Signed)
Patient having itching the last two days. No discharge noted.  Patient request testing for "everything".   Will send off culture and treat based off results. Armandina Stammer, RN

## 2023-10-06 LAB — CERVICOVAGINAL ANCILLARY ONLY
Bacterial Vaginitis (gardnerella): NEGATIVE
Candida Glabrata: NEGATIVE
Candida Vaginitis: POSITIVE — AB
Chlamydia: NEGATIVE
Comment: NEGATIVE
Comment: NEGATIVE
Comment: NEGATIVE
Comment: NEGATIVE
Comment: NEGATIVE
Comment: NORMAL
Neisseria Gonorrhea: NEGATIVE
Trichomonas: NEGATIVE

## 2023-10-07 ENCOUNTER — Encounter: Payer: Self-pay | Admitting: Family Medicine

## 2023-10-07 ENCOUNTER — Other Ambulatory Visit (HOSPITAL_COMMUNITY): Payer: Self-pay

## 2023-10-07 ENCOUNTER — Other Ambulatory Visit: Payer: Self-pay | Admitting: Family Medicine

## 2023-10-07 MED ORDER — FLUCONAZOLE 150 MG PO TABS: 150.0000 mg | ORAL_TABLET | Freq: Once | ORAL | 0 refills | Status: AC

## 2023-10-07 MED ORDER — FLUCONAZOLE 150 MG PO TABS
150.0000 mg | ORAL_TABLET | Freq: Once | ORAL | 0 refills | Status: DC
  Filled 2023-10-07: qty 1, 1d supply, fill #0

## 2023-10-07 NOTE — Addendum Note (Signed)
Addended by: Levie Heritage on: 10/07/2023 12:54 PM   Modules accepted: Orders

## 2023-11-02 ENCOUNTER — Other Ambulatory Visit (HOSPITAL_COMMUNITY)
Admission: RE | Admit: 2023-11-02 | Discharge: 2023-11-02 | Disposition: A | Discharge status: Home / Self Care | Payer: Medicaid Other | Source: Ambulatory Visit | Attending: Physician Assistant | Admitting: Physician Assistant

## 2023-11-02 ENCOUNTER — Ambulatory Visit: Payer: Medicaid Other

## 2023-11-02 VITALS — BP 115/95 | HR 88 | Wt 190.0 lb

## 2023-11-02 DIAGNOSIS — N898 Other specified noninflammatory disorders of vagina: Secondary | ICD-10-CM | POA: Insufficient documentation

## 2023-11-02 NOTE — Progress Notes (Unsigned)
 SUBJECTIVE:  33 y.o. female complains of fishy order for 1 day(s) and  Denies abnormal vaginal bleeding or significant pelvic pain or fever.Denies history of known exposure to STD.  Patient's last menstrual period was 10/10/2023 (exact date).  OBJECTIVE:  She appears alert, well appearing, in no apparent distress .  ASSESSMENT:  Vaginal Odor   PLAN:  BVAG, CVAG probe sent to lab. Treatment: To be determined once lab results are received ROV prn if symptoms persist or worsen.   Danna Hefty, RN

## 2023-11-05 ENCOUNTER — Other Ambulatory Visit: Payer: Self-pay | Admitting: Physician Assistant

## 2023-11-05 DIAGNOSIS — B9689 Other specified bacterial agents as the cause of diseases classified elsewhere: Secondary | ICD-10-CM

## 2023-11-05 DIAGNOSIS — N760 Acute vaginitis: Secondary | ICD-10-CM

## 2023-11-05 LAB — CERVICOVAGINAL ANCILLARY ONLY
Candida Glabrata: NEGATIVE
Chlamydia: NEGATIVE
Comment: NEGATIVE
Comment: NEGATIVE
Comment: NEGATIVE
Comment: NEGATIVE
Comment: NORMAL
Comment: NORMAL
Comment: POSITIVE — AB
Neisseria Gonorrhea: NEGATIVE

## 2023-11-05 MED ORDER — METRONIDAZOLE 500 MG PO TABS: 500.0000 mg | ORAL_TABLET | Freq: Two times a day (BID) | ORAL | 0 refills | Status: AC

## 2023-12-23 ENCOUNTER — Ambulatory Visit

## 2023-12-23 ENCOUNTER — Other Ambulatory Visit (HOSPITAL_COMMUNITY)
Admission: RE | Admit: 2023-12-23 | Discharge: 2023-12-23 | Disposition: A | Discharge status: Home / Self Care | Source: Ambulatory Visit | Attending: Family Medicine | Admitting: Family Medicine

## 2023-12-23 VITALS — BP 129/74 | HR 89 | Wt 199.0 lb

## 2023-12-23 DIAGNOSIS — B9689 Other specified bacterial agents as the cause of diseases classified elsewhere: Secondary | ICD-10-CM | POA: Insufficient documentation

## 2023-12-23 DIAGNOSIS — N898 Other specified noninflammatory disorders of vagina: Secondary | ICD-10-CM | POA: Insufficient documentation

## 2023-12-23 DIAGNOSIS — N760 Acute vaginitis: Secondary | ICD-10-CM | POA: Diagnosis not present

## 2023-12-23 NOTE — Progress Notes (Signed)
 SUBJECTIVE:  33 y.o. female complains of white vaginal discharge for 1 week(s). Denies abnormal vaginal bleeding or significant pelvic pain or fever. No UTI symptoms. Denies history of known exposure to STD.  Patient's last menstrual period was 12/02/2023 (exact date).  OBJECTIVE:  She appears well, afebrile. Urine dipstick: not done.  ASSESSMENT:  Vaginal Discharge  Vaginal Odor   PLAN:   BVAG, CVAG probe sent to the lab,.  Treatment: To be determined once lab results are received ROV prn if symptoms persist or worsen.   Diana Fuentes l Elenie Coven, CMA

## 2023-12-28 LAB — CERVICOVAGINAL ANCILLARY ONLY
Bacterial Vaginitis (gardnerella): POSITIVE — AB
Candida Glabrata: NEGATIVE
Candida Vaginitis: NEGATIVE
Comment: NEGATIVE
Comment: NEGATIVE
Comment: NEGATIVE

## 2023-12-29 ENCOUNTER — Encounter: Payer: Self-pay | Admitting: Family Medicine

## 2023-12-29 MED ORDER — METRONIDAZOLE 1.3 % VA GEL: 1.0000 | Freq: Every day | VAGINAL | 0 refills | Status: AC

## 2024-06-21 ENCOUNTER — Other Ambulatory Visit (HOSPITAL_COMMUNITY)
Admission: RE | Admit: 2024-06-21 | Discharge: 2024-06-21 | Disposition: A | Discharge status: Home / Self Care | Source: Ambulatory Visit | Attending: Obstetrics and Gynecology | Admitting: Obstetrics and Gynecology

## 2024-06-21 ENCOUNTER — Ambulatory Visit (INDEPENDENT_AMBULATORY_CARE_PROVIDER_SITE_OTHER)

## 2024-06-21 ENCOUNTER — Ambulatory Visit

## 2024-06-21 VITALS — BP 121/65 | HR 82 | Ht 65.0 in | Wt 202.0 lb

## 2024-06-21 DIAGNOSIS — Z113 Encounter for screening for infections with a predominantly sexual mode of transmission: Secondary | ICD-10-CM | POA: Insufficient documentation

## 2024-06-21 NOTE — Progress Notes (Unsigned)
 SUBJECTIVE:  33 y.o. female who desires a STI screen. Denies abnormal vaginal discharge, bleeding or significant pelvic pain. No UTI symptoms. Denies history of known exposure to STD.  Patient's last menstrual period was 06/20/2024 (exact date).  OBJECTIVE:  She appears well.   ASSESSMENT:  STI Screen   PLAN:  Pt offered STI blood screening-requested GC, chlamydia, and trichomonas probe sent to lab.  Treatment: To be determined once lab results are received.  Pt follow up as needed.   Erminio DELENA Rumps, RN

## 2024-06-21 NOTE — Progress Notes (Signed)
 SUBJECTIVE:  33 y.o. female who desires a STI screen. Denies abnormal vaginal discharge, bleeding or significant pelvic pain. No UTI symptoms. Denies history of known exposure to STD.  Patient's last menstrual period was 06/20/2024.  OBJECTIVE:  She appears well.   ASSESSMENT:  STI Screen   PLAN:  Pt offered STI blood screening-requested GC, chlamydia, and trichomonas probe sent to lab.  Treatment: To be determined once lab results are received.  Pt follow up as needed.   Erminio DELENA Rumps, RN

## 2024-06-22 ENCOUNTER — Ambulatory Visit: Payer: Self-pay | Admitting: Obstetrics and Gynecology

## 2024-06-22 DIAGNOSIS — B9689 Other specified bacterial agents as the cause of diseases classified elsewhere: Secondary | ICD-10-CM

## 2024-06-22 LAB — RPR+HBSAG+HCVAB+...
HIV Screen 4th Generation wRfx: NONREACTIVE
Hep C Virus Ab: NONREACTIVE
Hepatitis B Surface Ag: NEGATIVE
RPR Ser Ql: NONREACTIVE

## 2024-06-23 LAB — CERVICOVAGINAL ANCILLARY ONLY
Bacterial Vaginitis (gardnerella): POSITIVE — AB
Candida Glabrata: NEGATIVE
Candida Vaginitis: NEGATIVE
Chlamydia: NEGATIVE
Comment: NEGATIVE
Comment: NEGATIVE
Comment: NEGATIVE
Comment: NEGATIVE
Comment: NEGATIVE
Comment: NORMAL
Neisseria Gonorrhea: NEGATIVE
Trichomonas: NEGATIVE

## 2024-06-23 MED ORDER — METRONIDAZOLE 0.75 % VA GEL
1.0000 | Freq: Every day | VAGINAL | 0 refills | Status: AC
Start: 2024-06-23 — End: ?
